# Patient Record
Sex: Female | Born: 1950
Health system: Southern US, Community
[De-identification: ages and names within clinical notes are randomized; demographics above are authoritative.]

## PROBLEM LIST (undated history)

## (undated) DIAGNOSIS — H269 Unspecified cataract: Secondary | ICD-10-CM

## (undated) DIAGNOSIS — Z9889 Other specified postprocedural states: Secondary | ICD-10-CM

## (undated) DIAGNOSIS — J329 Chronic sinusitis, unspecified: Secondary | ICD-10-CM

## (undated) DIAGNOSIS — T4145XA Adverse effect of unspecified anesthetic, initial encounter: Secondary | ICD-10-CM

## (undated) DIAGNOSIS — K2270 Barrett's esophagus without dysplasia: Secondary | ICD-10-CM

## (undated) DIAGNOSIS — K589 Irritable bowel syndrome without diarrhea: Secondary | ICD-10-CM

## (undated) DIAGNOSIS — M1990 Unspecified osteoarthritis, unspecified site: Secondary | ICD-10-CM

## (undated) DIAGNOSIS — K219 Gastro-esophageal reflux disease without esophagitis: Secondary | ICD-10-CM

## (undated) DIAGNOSIS — R112 Nausea with vomiting, unspecified: Secondary | ICD-10-CM

## (undated) DIAGNOSIS — Z8619 Personal history of other infectious and parasitic diseases: Secondary | ICD-10-CM

## (undated) DIAGNOSIS — T7840XA Allergy, unspecified, initial encounter: Secondary | ICD-10-CM

## (undated) DIAGNOSIS — M8580 Other specified disorders of bone density and structure, unspecified site: Secondary | ICD-10-CM

## (undated) DIAGNOSIS — K449 Diaphragmatic hernia without obstruction or gangrene: Secondary | ICD-10-CM

## (undated) DIAGNOSIS — K222 Esophageal obstruction: Secondary | ICD-10-CM

## (undated) DIAGNOSIS — T8859XA Other complications of anesthesia, initial encounter: Secondary | ICD-10-CM

## (undated) HISTORY — DX: Diaphragmatic hernia without obstruction or gangrene: K44.9

## (undated) HISTORY — DX: Irritable bowel syndrome, unspecified: K58.9

## (undated) HISTORY — DX: Barrett's esophagus without dysplasia: K22.70

## (undated) HISTORY — PX: TUBAL LIGATION: SHX77

## (undated) HISTORY — DX: Personal history of other infectious and parasitic diseases: Z86.19

## (undated) HISTORY — DX: Chronic sinusitis, unspecified: J32.9

## (undated) HISTORY — DX: Other specified disorders of bone density and structure, unspecified site: M85.80

## (undated) HISTORY — DX: Gastro-esophageal reflux disease without esophagitis: K21.9

## (undated) HISTORY — DX: Esophageal obstruction: K22.2

## (undated) HISTORY — DX: Allergy, unspecified, initial encounter: T78.40XA

## (undated) HISTORY — DX: Unspecified cataract: H26.9

---

## 1999-04-14 ENCOUNTER — Other Ambulatory Visit: Admission: RE | Admit: 1999-04-14 | Discharge: 1999-04-14 | Payer: Self-pay | Admitting: Family Medicine

## 1999-05-22 ENCOUNTER — Encounter: Payer: Self-pay | Admitting: Gastroenterology

## 1999-06-09 ENCOUNTER — Encounter: Payer: Self-pay | Admitting: Gastroenterology

## 1999-06-16 ENCOUNTER — Encounter: Payer: Self-pay | Admitting: Gastroenterology

## 2000-04-26 ENCOUNTER — Other Ambulatory Visit: Admission: RE | Admit: 2000-04-26 | Discharge: 2000-04-26 | Payer: Self-pay | Admitting: Family Medicine

## 2001-06-01 ENCOUNTER — Other Ambulatory Visit: Admission: RE | Admit: 2001-06-01 | Discharge: 2001-06-01 | Payer: Self-pay | Admitting: Family Medicine

## 2002-10-30 ENCOUNTER — Other Ambulatory Visit: Admission: RE | Admit: 2002-10-30 | Discharge: 2002-10-30 | Payer: Self-pay | Admitting: Family Medicine

## 2003-08-31 DIAGNOSIS — K2270 Barrett's esophagus without dysplasia: Secondary | ICD-10-CM

## 2003-08-31 HISTORY — DX: Barrett's esophagus without dysplasia: K22.70

## 2003-12-17 ENCOUNTER — Ambulatory Visit (HOSPITAL_COMMUNITY): Admission: RE | Admit: 2003-12-17 | Discharge: 2003-12-17 | Payer: Self-pay | Admitting: Gastroenterology

## 2003-12-17 ENCOUNTER — Encounter: Payer: Self-pay | Admitting: Gastroenterology

## 2003-12-17 ENCOUNTER — Encounter (INDEPENDENT_AMBULATORY_CARE_PROVIDER_SITE_OTHER): Payer: Self-pay | Admitting: Specialist

## 2004-01-28 ENCOUNTER — Other Ambulatory Visit: Admission: RE | Admit: 2004-01-28 | Discharge: 2004-01-28 | Payer: Self-pay | Admitting: Family Medicine

## 2004-01-29 ENCOUNTER — Ambulatory Visit (HOSPITAL_COMMUNITY): Admission: RE | Admit: 2004-01-29 | Discharge: 2004-01-29 | Payer: Self-pay | Admitting: Gastroenterology

## 2004-01-29 ENCOUNTER — Encounter: Payer: Self-pay | Admitting: Gastroenterology

## 2004-08-20 ENCOUNTER — Emergency Department: Payer: Self-pay | Admitting: Internal Medicine

## 2004-10-29 ENCOUNTER — Ambulatory Visit: Payer: Self-pay | Admitting: Family Medicine

## 2005-01-05 ENCOUNTER — Ambulatory Visit: Payer: Self-pay | Admitting: Gastroenterology

## 2005-01-28 ENCOUNTER — Other Ambulatory Visit: Admission: RE | Admit: 2005-01-28 | Discharge: 2005-01-28 | Payer: Self-pay | Admitting: Family Medicine

## 2005-01-28 ENCOUNTER — Ambulatory Visit: Payer: Self-pay | Admitting: Family Medicine

## 2005-03-16 LAB — HM DEXA SCAN

## 2005-04-06 ENCOUNTER — Ambulatory Visit: Payer: Self-pay | Admitting: Family Medicine

## 2005-11-16 ENCOUNTER — Ambulatory Visit: Payer: Self-pay | Admitting: Gastroenterology

## 2005-12-07 ENCOUNTER — Ambulatory Visit: Payer: Self-pay | Admitting: Gastroenterology

## 2005-12-21 ENCOUNTER — Ambulatory Visit: Payer: Self-pay | Admitting: Gastroenterology

## 2006-04-07 ENCOUNTER — Ambulatory Visit: Payer: Self-pay | Admitting: Family Medicine

## 2006-11-29 ENCOUNTER — Ambulatory Visit: Payer: Self-pay | Admitting: Gastroenterology

## 2007-01-10 ENCOUNTER — Ambulatory Visit: Payer: Self-pay | Admitting: Gastroenterology

## 2007-04-11 ENCOUNTER — Ambulatory Visit: Payer: Self-pay | Admitting: Family Medicine

## 2008-01-02 ENCOUNTER — Ambulatory Visit: Payer: Self-pay | Admitting: Gastroenterology

## 2008-01-05 ENCOUNTER — Telehealth: Payer: Self-pay | Admitting: Gastroenterology

## 2008-04-23 ENCOUNTER — Ambulatory Visit: Payer: Self-pay | Admitting: Family Medicine

## 2008-08-30 HISTORY — PX: CHOLECYSTECTOMY: SHX55

## 2008-10-21 ENCOUNTER — Telehealth: Payer: Self-pay | Admitting: Gastroenterology

## 2008-11-08 ENCOUNTER — Ambulatory Visit: Payer: Self-pay | Admitting: Gastroenterology

## 2008-11-08 DIAGNOSIS — K2270 Barrett's esophagus without dysplasia: Secondary | ICD-10-CM | POA: Insufficient documentation

## 2008-11-08 DIAGNOSIS — R1011 Right upper quadrant pain: Secondary | ICD-10-CM | POA: Insufficient documentation

## 2008-11-08 DIAGNOSIS — R079 Chest pain, unspecified: Secondary | ICD-10-CM | POA: Insufficient documentation

## 2008-11-08 DIAGNOSIS — K219 Gastro-esophageal reflux disease without esophagitis: Secondary | ICD-10-CM | POA: Insufficient documentation

## 2008-11-11 LAB — CONVERTED CEMR LAB
ALT: 19 units/L (ref 0–35)
Alkaline Phosphatase: 81 units/L (ref 39–117)
Basophils Absolute: 0 10*3/uL (ref 0.0–0.1)
Basophils Relative: 0.7 % (ref 0.0–3.0)
Creatinine, Ser: 0.7 mg/dL (ref 0.4–1.2)
GFR calc non Af Amer: 92 mL/min
HCT: 39.1 % (ref 36.0–46.0)
Hemoglobin: 12.9 g/dL (ref 12.0–15.0)
MCHC: 32.9 g/dL (ref 30.0–36.0)
MCV: 84.3 fL (ref 78.0–100.0)
Monocytes Absolute: 0.4 10*3/uL (ref 0.1–1.0)
Neutro Abs: 4.3 10*3/uL (ref 1.4–7.7)
RDW: 13 % (ref 11.5–14.6)
Sodium: 144 meq/L (ref 135–145)
Total Bilirubin: 0.9 mg/dL (ref 0.3–1.2)
Total Protein: 7.1 g/dL (ref 6.0–8.3)

## 2008-11-15 ENCOUNTER — Ambulatory Visit (HOSPITAL_COMMUNITY): Admission: RE | Admit: 2008-11-15 | Discharge: 2008-11-15 | Payer: Self-pay | Admitting: Gastroenterology

## 2008-11-18 DIAGNOSIS — K8020 Calculus of gallbladder without cholecystitis without obstruction: Secondary | ICD-10-CM | POA: Insufficient documentation

## 2008-11-28 DIAGNOSIS — K222 Esophageal obstruction: Secondary | ICD-10-CM

## 2008-11-28 DIAGNOSIS — K219 Gastro-esophageal reflux disease without esophagitis: Secondary | ICD-10-CM

## 2008-11-28 HISTORY — DX: Gastro-esophageal reflux disease without esophagitis: K21.9

## 2008-11-28 HISTORY — DX: Esophageal obstruction: K22.2

## 2008-12-03 ENCOUNTER — Ambulatory Visit: Payer: Self-pay | Admitting: Gastroenterology

## 2008-12-06 ENCOUNTER — Encounter: Payer: Self-pay | Admitting: Gastroenterology

## 2008-12-06 ENCOUNTER — Ambulatory Visit: Payer: Self-pay | Admitting: Gastroenterology

## 2008-12-09 ENCOUNTER — Encounter: Payer: Self-pay | Admitting: Gastroenterology

## 2008-12-09 ENCOUNTER — Telehealth: Payer: Self-pay | Admitting: Gastroenterology

## 2008-12-10 ENCOUNTER — Encounter: Payer: Self-pay | Admitting: Gastroenterology

## 2008-12-10 ENCOUNTER — Telehealth: Payer: Self-pay | Admitting: Gastroenterology

## 2008-12-11 ENCOUNTER — Ambulatory Visit (HOSPITAL_COMMUNITY): Admission: RE | Admit: 2008-12-11 | Discharge: 2008-12-11 | Payer: Self-pay | Admitting: Surgery

## 2008-12-11 ENCOUNTER — Encounter (INDEPENDENT_AMBULATORY_CARE_PROVIDER_SITE_OTHER): Payer: Self-pay | Admitting: Surgery

## 2009-02-12 ENCOUNTER — Encounter: Payer: Self-pay | Admitting: Gastroenterology

## 2009-04-29 ENCOUNTER — Ambulatory Visit: Payer: Self-pay | Admitting: Family Medicine

## 2009-06-10 ENCOUNTER — Telehealth: Payer: Self-pay | Admitting: Gastroenterology

## 2009-10-15 ENCOUNTER — Encounter (INDEPENDENT_AMBULATORY_CARE_PROVIDER_SITE_OTHER): Payer: Self-pay | Admitting: *Deleted

## 2009-10-20 ENCOUNTER — Encounter: Payer: Self-pay | Admitting: Nurse Practitioner

## 2009-10-24 ENCOUNTER — Telehealth: Payer: Self-pay | Admitting: Gastroenterology

## 2009-10-28 ENCOUNTER — Ambulatory Visit: Payer: Self-pay | Admitting: Internal Medicine

## 2009-10-30 ENCOUNTER — Telehealth: Payer: Self-pay | Admitting: Nurse Practitioner

## 2009-10-31 LAB — CONVERTED CEMR LAB
ALT: 21 units/L (ref 0–35)
Alkaline Phosphatase: 76 units/L (ref 39–117)
Bilirubin, Direct: 0.2 mg/dL (ref 0.0–0.3)
Total Bilirubin: 0.7 mg/dL (ref 0.3–1.2)
Total Protein: 6.4 g/dL (ref 6.0–8.3)

## 2009-11-03 ENCOUNTER — Ambulatory Visit (HOSPITAL_COMMUNITY): Admission: RE | Admit: 2009-11-03 | Discharge: 2009-11-03 | Payer: Self-pay | Admitting: Internal Medicine

## 2009-11-10 ENCOUNTER — Encounter: Payer: Self-pay | Admitting: Cardiology

## 2009-11-10 ENCOUNTER — Ambulatory Visit: Payer: Self-pay | Admitting: Cardiology

## 2009-11-10 ENCOUNTER — Ambulatory Visit: Payer: Self-pay

## 2009-11-10 ENCOUNTER — Ambulatory Visit (HOSPITAL_COMMUNITY): Admission: RE | Admit: 2009-11-10 | Discharge: 2009-11-10 | Payer: Self-pay | Admitting: Family Medicine

## 2009-11-24 ENCOUNTER — Ambulatory Visit: Payer: Self-pay | Admitting: Gastroenterology

## 2009-12-11 ENCOUNTER — Ambulatory Visit: Payer: Self-pay | Admitting: Cardiology

## 2009-12-29 ENCOUNTER — Encounter: Payer: Self-pay | Admitting: Gastroenterology

## 2010-02-24 ENCOUNTER — Telehealth: Payer: Self-pay | Admitting: Gastroenterology

## 2010-02-24 ENCOUNTER — Ambulatory Visit: Payer: Self-pay

## 2010-02-24 ENCOUNTER — Encounter: Payer: Self-pay | Admitting: Cardiovascular Disease

## 2010-05-06 ENCOUNTER — Ambulatory Visit: Payer: Self-pay | Admitting: Family Medicine

## 2010-06-22 ENCOUNTER — Encounter: Payer: Self-pay | Admitting: Gastroenterology

## 2010-09-29 NOTE — Progress Notes (Signed)
Summary: Medication refill  Phone Note From Pharmacy   Caller: [facility]  [phone] Call For: Dr. Russella Dar  Summary of Call: Need refill on her Prevacid Initial call taken by: Karna Christmas,  February 24, 2010 1:33 PM  Follow-up for Phone Call        Rx was sent to pts pharmacy.  Follow-up by: Christie Nottingham CMA Duncan Dull),  February 24, 2010 1:59 PM    Prescriptions: PREVACID SOLUTAB 30 MG  TBDP (LANSOPRAZOLE) take 1 tablet dissolved under the tongue two times a day  #60 x 11   Entered by:   Christie Nottingham CMA (AAMA)   Authorized by:   Meryl Dare MD [facility]   Signed by:   Christie Nottingham CMA (AAMA) on 02/24/2010   Method used:   Electronically to        [facility] [phone]* (retail)       [address] [place], [place]  [address]       Ph: [phone]       Fax: [phone]   RxID:   [id number]

## 2010-09-29 NOTE — Medication Information (Signed)
Summary: Approval/[employer]  Approval/[employer]   Imported By: Lester Healy 12/31/2009 10:45:54  _____________________________________________________________________  External Attachment:    Type:   Image     Comment:   External Document

## 2010-09-29 NOTE — Letter (Signed)
Summary: Office Visit Letter  [facility]  [address] [place], [place] [address]   Phone: [phone]  Fax: [phone]      October 15, 2009 MRN: [id number]   Jenny Mcfarland [address] [place], [place]  [address]   Dear Ms. Maund,   According to our records, it is time for you to schedule a follow-up office visit with [place].   At your convenience, please call [phone] (option #2)to schedule an office visit. If you have any questions, concerns, or feel that this letter is in error, we would appreciate your call.   Sincerely,  Judie Petit T. Russella Dar, M.D.  [facility] [phone]

## 2010-09-29 NOTE — Assessment & Plan Note (Signed)
Summary: np6   Visit Type:  new pt visit Referring Provider:  n/a Primary Provider:  Orson Gear, MD  CC:  pt c/o dull pain between shoulder blades that comes through to the from of her chest she states......denies any cp or sob ore edema.  History of Present Illness: 60 yo with history of severe GERD with Barretts esophagus and history of peptic stricture presents for evaluation of upper back pain after a stress echo.  On 2/20, patient "out of the blue" developed severe RUQ pain that migrated up to her right shoulder blade.  It felt like past symptomatic cholelithiasis pain (but she has had a cholecystectomy).  She then developed a burning sensation between her shoulder blades with resolution of RUQ pain.  The burning sensation between her shoulder blades lasted fairly continuously for 5-6 weeks.  It was not like her prior GERD pain.  It has now completely resolved.  She never had chest pain. The pain was not aggravated by exertion or meals.  She has good exercise tolerance with no exertional dyspnea.  She had a RUQ [place] that was normal.  CXR at her PCP's office was normal.  She saw her gastroenterologist (Dr. Russella Dar) who did not think the symptoms were related to GERD.  She had a stress echo which showed no evidence for ischemia or infarction.  Of note, her mother died with a AAA rupture and she is worried about the possibility of an aneurysm.   ECG: NSR, nonspecific slight ST depression inferolaterally  Current Medications (verified): 1)  Prevacid Solutab 30 Mg  Tbdp (Lansoprazole) .... Take 1 Tablet Dissolved Under The Tongue Two Times A Day 2)  Allegra 180 Mg Tabs (Fexofenadine Hcl) .... Take 1 Tablet By Mouth Once A Day 3)  Ambien 5 Mg Tabs (Zolpidem Tartrate) .... 1/2 Tab At Bedtime For Sleep 4)  Symbicort 160-4.5 Mcg/act Aero (Budesonide-Formoterol Fumarate) .... Two Times A Day  Allergies (verified): 1)  ! Levaquin  Past History:  Past Medical History: 1. GERD with Barretts  esophagus and erosive esophagitis, 11/2008 2. Peptic stricture 11/2008 3. Asthma 4. Allergic rhinitis 5. Irritable Bowel Syndrome 6. Hemorrhoids 7. Hiatal hernia 8. Recurrent sinusitis 9. Stress echo (3/11) EF 65%, no evidence for ischemia or infarction.   Family History: Family History of Heart Disease: Mother CHF but no known CAD No FH of Colon Cancer: Mother died of complications from aortic aneurysm Brother with MI in early 19s  Social History: Married, 2 girls Patient has never smoked.  Alcohol Use - no Illicit Drug Use - no Daily Caffeine Use soft drinks/tea Lives between [place] and [place] Receptionist for a pediatrician  Review of Systems       All systems reviewed and negative except as per HPI.   Vital Signs:  Patient profile:   60 year old female Height:      63 inches Weight:      182 pounds BMI:     32.36 Pulse rate:   69 / minute Pulse rhythm:   irregular BP sitting:   122 / 70  (right arm) Cuff size:   large  Vitals Entered By: Danielle Rankin, CMA (December 11, 2009 3:21 PM)  Physical Exam  General:  Well developed, well nourished, in no acute distress. Head:  normocephalic and atraumatic Nose:  no deformity, discharge, inflammation, or lesions Mouth:  Teeth, gums and palate normal. Oral mucosa normal. Neck:  Neck supple, no JVD. No masses, thyromegaly or abnormal cervical nodes. Lungs:  Clear bilaterally  to auscultation and percussion. Heart:  Non-displaced PMI, chest non-tender; regular rate and rhythm, S1, S2 without murmurs, rubs or gallops. Carotid upstroke normal, no bruit.  Pedals normal pulses. No edema, no varicosities. Abdomen:  Bowel sounds positive; abdomen soft and non-tender without masses, organomegaly, or hernias noted. No hepatosplenomegaly. Msk:  Back normal, normal gait. Muscle strength and tone normal. Extremities:  No clubbing or cyanosis. Neurologic:  Alert and oriented x 3. Skin:  Intact without lesions or rashes. Psych:   Normal affect.   Impression & Recommendations:  Problem # 1:  BACK PAIN Stress echo with no evidence for ischemia or infarction, normal LV systolic function.  Suspect that the pain was noncardiac.  Patient is worried about aortic aneurysm given her family history.  I offered a screening abdominal US to her and I think that she would like to have it done.  In general, given her family history (brother with MI in early 74s),  she will need good control of risk factors (lipids, BP).   Other Orders: Abdominal Aorta Duplex (Abd Aorta Duplex)

## 2010-09-29 NOTE — Assessment & Plan Note (Signed)
Summary: ruq abdominal pain/pain between shoulder blades/sheri   History of Present Illness Visit Type: Follow-up Visit Primary GI MD: Elie Goody MD Primary Provider: Orson Gear, MD Requesting Provider: n/a Chief Complaint: RUQ abd pain that radiates between shoulder blades and diarrhea  History of Present Illness:   Patient followed by  Dr. Russella Dar for history of GERD / Barrett's/ esophageal stricture. On Feb. 20th she developed severe pain behind right  breast with radiation through to right shoulder blade. Pain was burning, achy.  She had associated fever of 100.5. The pain lasted for two hours then spontaneously resolved.  She is on daily PPI. She doesn't recall  history of reflux / heartburn but EGD in 2010 revealed erosive esohagitis as well as esophageal stricture. She has no dysphagia or odynophagia. Up to date on mammograms. No breast changes. No nausea or vomiting. Pain remniscent of  "gallbladder pain" except  patient doesn't recall having such prominent shoulder blade pain.  Saw PCP, labs and CXR "okay". Patient decreased her fat intake and increased Prevacid to twice daily thinking these things would help. Overall feeling better but still having intermittent discomfort between her shoulder blades and now complains of post-prandial fullness.   She has been battling sinus drainage and  a bad cough.     GI Review of Systems      Denies abdominal pain, acid reflux, belching, bloating, chest pain, dysphagia with liquids, dysphagia with solids, heartburn, loss of appetite, nausea, vomiting, vomiting blood, weight loss, and  weight gain.        Denies anal fissure, black tarry stools, change in bowel habit, constipation, diarrhea, diverticulosis, fecal incontinence, heme positive stool, hemorrhoids, irritable bowel syndrome, jaundice, light color stool, liver problems, rectal bleeding, and  rectal pain.    Current Medications (verified): 1)  Prevacid Solutab 30 Mg  Tbdp  (Lansoprazole) .... Take 1 Tablet Dissolved Under The Tongue Two Times A Day 2)  Allegra 180 Mg Tabs (Fexofenadine Hcl) .... Take 1 Tablet By Mouth Once A Day 3)  Ambien 5 Mg Tabs (Zolpidem Tartrate) .... 1/2 Tab At Bedtime For Sleep 4)  Symbicort 160-4.5 Mcg/act Aero (Budesonide-Formoterol Fumarate) .... Two Times A Day 5)  Doxycycline Hyclate 100 Mg Caps (Doxycycline Hyclate) .... One Capsule By Mouth Two Times A Day  Allergies (verified): No Known Drug Allergies  Past History:  Past Medical History: GERD Barretts esophagus, 11/2008 Erosive esophagitis, 11/2008 Peptic stricture 11/2008 Gallstones Asthma Allergic rhinitis Irritable Bowel Syndrome Hemorrhoids Hiatal hernia  Past Surgical History: Bilateral tubal ligation Cholecystectomy  Family History: Family History of Heart Disease: Mother CHF No FH of Colon Cancer:  Social History: Reviewed history from 11/08/2008 and no changes required. Married, 2 girls Patient has never smoked.  Alcohol Use - no Illicit Drug Use - no Daily Caffeine Use soft drinks/tea  Review of Systems       The patient complains of back pain and fever.  The patient denies allergy/sinus, anemia, anxiety-new, arthritis/joint pain, blood in urine, breast changes/lumps, change in vision, confusion, cough, coughing up blood, depression-new, fainting, fatigue, headaches-new, hearing problems, heart murmur, heart rhythm changes, itching, menstrual pain, muscle pains/cramps, night sweats, nosebleeds, pregnancy symptoms, shortness of breath, skin rash, sleeping problems, sore throat, swelling of feet/legs, swollen lymph glands, thirst - excessive , urination - excessive , urination changes/pain, urine leakage, vision changes, and voice change.    Vital Signs:  Patient profile:   60 year old female Height:      66 inches Weight:  181 pounds BMI:     29.32 BSA:     1.92 Temp:     97.9 degrees F oral Pulse rate:   92 / minute Pulse rhythm:    regular BP sitting:   98 / 64  (left arm) Cuff size:   regular  Vitals Entered By: Ok Anis CMA (October 28, 2009 2:05 PM)  Physical Exam  General:  Well developed, well nourished, no acute distress. Head:  Normocephalic and atraumatic. Mouth:  No oral lesions. Tongue moist.  Neck:  no obvious masses  Lungs:  Clear to auscultation throughout. Heart:  Regular rate and rhythm; no murmurs, rubs,  or bruits. Abdomen:  Abdomen soft, nontender, nondistended. No obvious masses or hepatomegaly.Normal bowel sounds.  Msk:  Symmetrical with no gross deformities. Normal posture. No chest wall tenderness. Extremities:  No palmar erythema, no edema.  Neurologic:  Alert and  oriented x4;  grossly normal neurologically. Skin:  Intact without significant lesions or rashes. Cervical Nodes:  No significant cervical adenopathy. Psych:  Alert and cooperative. Normal mood and affect.   Impression & Recommendations:  Problem # 1:  CHEST PAIN (ICD-786.50) Assessment Deteriorated Recent episode of severe pain behind right breast radiating to right shoulder blade. No SOB. Same pain as with gallbladder attacks. No SOB.  Evaluated by PCP. Patient tells me CXR was normal. I recieved lab results revealing normal CBC, D-Dimer, CPK and Troponin. LFTs were not drawn. She has some residual discomfort between shoulder blades. Patient is scheduled for stress test 11/10/09. Will obtain LFTs, U/S abdomen to evaluate for biliary disease.. Continue PPI twice daily for now.  Follow up appt. will be made with Dr. Russella Dar.  Patient will be called with test results and any further recommendations based on those results. If her stress test is abnormal, patient will let us know so that GI workup will be postponed.   Orders: Ultrasound Abdomen (UAS) TLB-Hepatic/Liver Function Pnl (80076-HEPATIC)  Problem # 2:  BARRETT'S ESOPHAGUS (ICD-530.85) Assessment: Comment Only Continue PPI  Patient Instructions: 1)  Your physician  has requested that you have the following labwork done today: Please go to basement level lab. 2)  We scheduled the Ultrasound at [facility] 1st floor for [facility] 11-03-09.  Please arrive at 8:45Am. Have nothing to eat or drink after midnight. 3)  Continue the Prevacid Solutab twice daily. 4)  Copy sent to : Orson Gear, MD 5)  The medication list was reviewed and reconciled.  All changed / newly prescribed medications were explained.  A complete medication list was provided to the patient / caregiver.

## 2010-09-29 NOTE — Assessment & Plan Note (Signed)
Summary: yearly check up..em   History of Present Illness Visit Type: Follow-up Visit Primary GI MD: Elie Goody MD Primary Provider: Orson Gear, MD Requesting Provider: n/a Chief Complaint: F/u from office visit with Jenny Mcfarland for R chest pain that radiates between her shoulder blades. Pt states that the chest pain is gone but still having pain in between her shoulder blades History of Present Illness:   [name] has resolution of her right sided chest pain and now has intermittent pains going across her upper back. No clear relationship to meals or any digestive function. She has no abdominal pain or chest pain. Her abd U/S and LFTs were normal. Her GERD symptoms are well controlled.   GI Review of Systems      Denies abdominal pain, acid reflux, belching, bloating, chest pain, dysphagia with liquids, dysphagia with solids, heartburn, loss of appetite, nausea, vomiting, vomiting blood, weight loss, and  weight gain.        Denies anal fissure, black tarry stools, change in bowel habit, constipation, diarrhea, diverticulosis, fecal incontinence, heme positive stool, hemorrhoids, irritable bowel syndrome, jaundice, light color stool, liver problems, rectal bleeding, and  rectal pain.   Current Medications (verified): 1)  Prevacid Solutab 30 Mg  Tbdp (Lansoprazole) .... Take 1 Tablet Dissolved Under The Tongue Two Times A Day 2)  Allegra 180 Mg Tabs (Fexofenadine Hcl) .... Take 1 Tablet By Mouth Once A Day 3)  Ambien 5 Mg Tabs (Zolpidem Tartrate) .... 1/2 Tab At Bedtime For Sleep 4)  Symbicort 160-4.5 Mcg/act Aero (Budesonide-Formoterol Fumarate) .... Two Times A Day  Allergies (verified): No Known Drug Allergies  Past History:  Past Medical History: GERD Barretts esophagus, 11/2008 Erosive esophagitis, 11/2008 Peptic stricture 11/2008 Asthma Allergic rhinitis Irritable Bowel Syndrome Hemorrhoids Hiatal hernia  Past Surgical History: Bilateral tubal  ligation Cholecystectomy, 2010  Family History: Reviewed history from 10/28/2009 and no changes required. Family History of Heart Disease: Mother CHF No FH of Colon Cancer:  Social History: Reviewed history from 11/08/2008 and no changes required. Married, 2 girls Patient has never smoked.  Alcohol Use - no Illicit Drug Use - no Daily Caffeine Use soft drinks/tea  Review of Systems       The patient complains of allergy/sinus.         The pertinent positives and negatives are noted as above and in the HPI. All other ROS were reviewed and were negative.   Vital Signs:  Patient profile:   60 year old female Height:      63 inches Weight:      181 pounds BMI:     32.18 BSA:     1.86 Pulse rate:   88 / minute Pulse rhythm:   regular BP sitting:   100 / 60  (left arm) Cuff size:   regular  Vitals Entered By: Ok Anis CMA (November 24, 2009 3:08 PM)  Physical Exam  General:  Well developed, well nourished, no acute distress. Head:  Normocephalic and atraumatic. Eyes:  PERRLA, no icterus. Mouth:  No deformity or lesions, dentition normal. Chest Wall:  Symmetrical;  no deformities or tenderness. Lungs:  Clear throughout to auscultation. Heart:  Regular rate and rhythm; no murmurs, rubs,  or bruits. Abdomen:  Soft, nontender and nondistended. No masses, hepatosplenomegaly or hernias noted. Normal bowel sounds. Psych:  Alert and cooperative.anxious.    Impression & Recommendations:  Problem # 1:  BARRETT'S ESOPHAGUS (ICD-530.85) Continue Prevacid 30 mg by mouth two times a day and antireflux measures.  Surveillance EGD in 2 years.  Problem # 2:  CHEST PAIN (ICD-786.50) Resolved. Not likely GI related. No GI cause for intermittent upper back pain. Follow up with her PCP.  Problem # 3:  SCREENING COLORECTAL-CANCER (ICD-V76.51) Screening colonoscopy 11/2015.  Patient Instructions: 1)  Please continue current medications.  2)  Please schedule a follow-up appointment in 1  year. 3)  Copy sent to : Orson Gear, MD 4)  The medication list was reviewed and reconciled.  All changed / newly prescribed medications were explained.  A complete medication list was provided to the patient / caregiver.

## 2010-09-29 NOTE — Progress Notes (Signed)
Summary: Talk to nurse about some problems  Phone Note Call from Patient Call back at Home Phone [phone]   Call For: Dr Russella Dar Reason for Call: Talk to Nurse Summary of Call: Wants to speak to nurse about some problems she has been having.  Initial call taken by: Leanor Kail [facility],  October 24, 2009 9:21 AM  Follow-up for Phone Call        Patient  having pain in the right upper quad and between her shoulder blades, last Sunday after an attack of pain she had a t-max 100.  Pain is intermittent and after meals.  Patient  had a cholecystectomy for gallstones last year and says it is the same pain.  Slight pain today but no fever. She increased her prevacid to BID and has a slight improvement.  Patient  is offered an appointment with Dr Russella Dar for this afternoon, but she wants to try and modify her diet and wait to see what happens over the weekend.  She will call me back if she wants to be seen by an extender next week. Pt instructed to maintain anti-reflux diet. Advised to avoid caffeine, mint, citrus foods/juices, tomatoes, chocolate, fatty or greasy foods. Instructed not to eat within 2 hours of exercise or bed, small meals are better than 3 large. Need to take PPI 30 minutes prior to 1st meal of the day. Follow-up by: Darcey Nora RN, CGRN,  October 24, 2009 10:16 AM  Additional Follow-up for Phone Call Additional follow up Details #1::        Would offer LFTs, lipase, BMET, CBC or Monday and REV if symptoms persist or labs abnl. Additional Follow-up by: Meryl Dare MD Clementeen Graham,  October 24, 2009 10:58 AM    Additional Follow-up for Phone Call Additional follow up Details #2::    I spoke with the patient again, she is unable to come today for labs.  She has requested an appointment for next week.  patient scheduled to see Willette Cluster RNP 10-28-09 2:00.  She wants to try and modify her diet over the weekend Follow-up by: Darcey Nora RN, CGRN,  October 24, 2009 11:15 AM

## 2010-09-29 NOTE — Miscellaneous (Signed)
Summary: Lansoprazole refill  Clinical Lists Changes  Medications: Rx of PREVACID SOLUTAB 30 MG  TBDP (LANSOPRAZOLE) take 1 tablet dissolved under the tongue two times a day;  #60 x 5;  Signed;  Entered by: Christie Nottingham CMA (AAMA);  Authorized by: Meryl Dare MD Clementeen Graham;  Method used: Electronically to [facility] [phone]*, [address] [place], [place]  [address], Ph: [phone], Fax: [phone]    Prescriptions: PREVACID SOLUTAB 30 MG  TBDP (LANSOPRAZOLE) take 1 tablet dissolved under the tongue two times a day  #60 x 5   Entered by:   Christie Nottingham CMA (AAMA)   Authorized by:   Meryl Dare MD [facility]   Signed by:   Christie Nottingham CMA (AAMA) on 06/22/2010   Method used:   Electronically to        [facility] [phone]* (retail)       [address] [place], [place]  [address]       Ph: [phone]       Fax: [phone]   RxID:   [id number]

## 2010-09-29 NOTE — Progress Notes (Signed)
Summary: PHI  PHI   Imported By: Harlon Flor 12/12/2009 12:20:04  _____________________________________________________________________  External Attachment:    Type:   Image     Comment:   External Document

## 2010-09-29 NOTE — Progress Notes (Signed)
Summary: results  Phone Note Call from Patient Call back at Work Phone [phone]   Caller: Patient Call For: [name] Reason for Call: Talk to Nurse, Lab or Test Results Summary of Call: Patient wants lab results  Initial call taken by: Tawni Levy,  October 30, 2009 8:43 AM  Follow-up for Phone Call        Pt advised his LFT's are normal and we will wait to see what the US shows which is scheduled for 11-03-09.

## 2010-10-21 ENCOUNTER — Encounter: Payer: Self-pay | Admitting: Family Medicine

## 2010-11-26 ENCOUNTER — Encounter: Payer: Self-pay | Admitting: Gastroenterology

## 2010-11-26 ENCOUNTER — Ambulatory Visit (INDEPENDENT_AMBULATORY_CARE_PROVIDER_SITE_OTHER): Payer: 59 | Admitting: Gastroenterology

## 2010-11-26 DIAGNOSIS — R197 Diarrhea, unspecified: Secondary | ICD-10-CM

## 2010-11-26 DIAGNOSIS — K2270 Barrett's esophagus without dysplasia: Secondary | ICD-10-CM

## 2010-11-26 MED ORDER — CHOLESTYRAMINE 4 G PO PACK: 1.0000 | PACK | Freq: Every day | ORAL | Status: DC

## 2010-11-26 MED ORDER — LANSOPRAZOLE 30 MG PO TBDP: 30.0000 mg | ORAL_TABLET | Freq: Two times a day (BID) | ORAL | Status: DC

## 2010-11-26 NOTE — Assessment & Plan Note (Signed)
Short segment of Barrett's esophagus with a history of a peptic stricture. Reflux symptoms well controlled on pantoprazole. Continue pantoprazole 30 mg daily along with standard antireflux measures. Surveillance endoscopy to April 2013.

## 2010-11-26 NOTE — Assessment & Plan Note (Addendum)
Urgent postprandial diarrhea which has worsened since her cholecystectomy in 2010. She likely has underlying irritable bowel syndrome but may have a component of bile salt diarrhea. Trial of Questran daily. May adjust dose to twice a day or every other day titrated for adequate symptom control.

## 2010-11-26 NOTE — Progress Notes (Signed)
History of Present Illness: This is a 60 year old female who returns for followup of GERD, Barrett's esophagus and diarrhea. Her reflux symptoms are under very good control. She has no dysphasia vomiting or abdominal pain. She has a history of irritable bowel syndrome with intermittent urgent, loose stools however since her cholecystectomy in 2010 she has had urgent watery stools on a regular basis. Colonoscopy performed in April 2007 was normal except for small hemorrhoids. She denies melena. hematochezia, weight loss.  Current Medications, Allergies, Past Medical History, Past Surgical History, Family History and Social History were reviewed in [employer] record.  Physical Exam: General: Well developed , well nourished, no acute distress Head: Normocephalic and atraumatic Eyes:  sclerae anicteric, EOMI Ears: Normal auditory acuity Mouth: No deformity or lesions Lungs: Clear throughout to auscultation Heart: Regular rate and rhythm; no murmurs, rubs or bruits Abdomen: Soft, non tender and non distended. No masses, hepatosplenomegaly or hernias noted. Normal Bowel sounds Musculoskeletal: Symmetrical with no gross deformities  Extremities: No clubbing, cyanosis, edema or deformities noted Neurological: Alert oriented x 4, grossly nonfocal Psychological:  Alert and cooperative. Normal mood and affect  Assessment and Recommendations:

## 2010-11-26 NOTE — Patient Instructions (Signed)
Your prescriptions have been sent to your pharmacy 

## 2010-12-08 ENCOUNTER — Ambulatory Visit: Payer: Self-pay | Admitting: Family Medicine

## 2010-12-09 LAB — DIFFERENTIAL
Basophils Absolute: 0 10*3/uL (ref 0.0–0.1)
Eosinophils Relative: 14 % — ABNORMAL HIGH (ref 0–5)
Lymphocytes Relative: 25 % (ref 12–46)
Lymphs Abs: 1.4 10*3/uL (ref 0.7–4.0)
Neutro Abs: 3.1 10*3/uL (ref 1.7–7.7)
Neutrophils Relative %: 54 % (ref 43–77)

## 2010-12-09 LAB — CBC
HCT: 35.8 % — ABNORMAL LOW (ref 36.0–46.0)
MCHC: 34 g/dL (ref 30.0–36.0)
MCV: 83 fL (ref 78.0–100.0)
RBC: 4.32 MIL/uL (ref 3.87–5.11)
WBC: 5.8 10*3/uL (ref 4.0–10.5)

## 2010-12-09 LAB — COMPREHENSIVE METABOLIC PANEL
AST: 17 U/L (ref 0–37)
BUN: 6 mg/dL (ref 6–23)
CO2: 30 mEq/L (ref 19–32)
Calcium: 9.3 mg/dL (ref 8.4–10.5)
Chloride: 107 mEq/L (ref 96–112)
Creatinine, Ser: 0.7 mg/dL (ref 0.4–1.2)
GFR calc Af Amer: >60 mL/min (ref >60)
GFR calc non Af Amer: >60 mL/min (ref >60)
Glucose, Bld: 108 mg/dL — ABNORMAL HIGH (ref 70–99)
Total Bilirubin: 0.7 mg/dL (ref 0.3–1.2)

## 2010-12-25 ENCOUNTER — Ambulatory Visit (INDEPENDENT_AMBULATORY_CARE_PROVIDER_SITE_OTHER): Payer: 59 | Admitting: Family Medicine

## 2010-12-25 ENCOUNTER — Encounter: Payer: Self-pay | Admitting: Family Medicine

## 2010-12-25 DIAGNOSIS — M899 Disorder of bone, unspecified: Secondary | ICD-10-CM

## 2010-12-25 DIAGNOSIS — K589 Irritable bowel syndrome without diarrhea: Secondary | ICD-10-CM

## 2010-12-25 DIAGNOSIS — Z8249 Family history of ischemic heart disease and other diseases of the circulatory system: Secondary | ICD-10-CM

## 2010-12-25 DIAGNOSIS — J45909 Unspecified asthma, uncomplicated: Secondary | ICD-10-CM | POA: Insufficient documentation

## 2010-12-25 DIAGNOSIS — K2270 Barrett's esophagus without dysplasia: Secondary | ICD-10-CM

## 2010-12-25 DIAGNOSIS — M8580 Other specified disorders of bone density and structure, unspecified site: Secondary | ICD-10-CM

## 2010-12-25 DIAGNOSIS — J309 Allergic rhinitis, unspecified: Secondary | ICD-10-CM

## 2010-12-25 DIAGNOSIS — E785 Hyperlipidemia, unspecified: Secondary | ICD-10-CM

## 2010-12-25 DIAGNOSIS — Z803 Family history of malignant neoplasm of breast: Secondary | ICD-10-CM

## 2010-12-25 NOTE — Progress Notes (Signed)
Subjective:    Patient ID: Jenny Mcfarland, female    DOB: Jan 28, 1951, 60 y.o.   MRN: [id number]  HPI Here to get est for primary care Used to see Dr Benedetto Goad  Had to transfer due to insurance   Lives between here and [place]    GI- sees Dr Russella Dar  Has IBS and GERD with Barretts Has regular endocsopies for Barretts- last 5-6 years Due in 2013 for colonoscopy- gets that every 10 years Is well controlled  IBS-- diarrhea predominant - recently tried [place] - gives her bad reflux     Allergies - Dr [place] Callas -- pretty well controlled on current meds  Also asthma --is doing well with that  Flares primarily with illness  Does not get allergy shots   Does get a flu shot every year -- got one last fall  Never had pneumovax  Is interested in talking to Dr [place] Callas about that   Sleep disorder - on lunesta -- bad dreams (ambien worked better )  Still has hot flashes - wakes up every hour   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 GERD (gastroesophageal reflux disease) 11/2008  . Peptic stricture of esophagus 11-2008  . Asthma   . Allergic rhinitis   . IBS (irritable bowel syndrome)   . Hemorrhoids   . Hiatal hernia   . Recurrent sinusitis   . Barrett's esophagus 2005  . History of chicken pox    Past Surgical History  Procedure Date  . Tubal ligation   . Cholecystectomy 2010    reports that she has never smoked. She has never used smokeless tobacco. She reports that she does not drink alcohol or use illicit  drugs. family history includes Aortic aneurysm in her mother; Arthritis in her brother; Breast cancer in her sister; Cancer in her sister; Heart attack in her brother; and Heart disease in her brother and mother.  There is no history of Colon cancer. Allergies  Allergen Reactions  . Levofloxacin        Review of Systems Review of Systems  Constitutional: Negative for fever, appetite change, fatigue and unexpected weight change.  Eyes: Negative for pain and visual disturbance.  Respiratory: Negative for cough and shortness of breath.   Cardiovascular: Negative.   Gastrointestinal: Negative for nausea, diarrhea and constipation.  Genitourinary: Negative for urgency and frequency.  Skin: Negative for pallor.  Neurological: Negative for weakness, light-headedness, numbness and headaches.  Hematological: Negative for adenopathy. Does not bruise/bleed easily.  Psychiatric/Behavioral: Negative for dysphoric mood. The patient is not nervous/anxious.          Objective:   Physical Exam  Constitutional: She appears well-developed and well-nourished.       overwt and well appearing   HENT:  Head: Normocephalic and atraumatic.  Right Ear: External ear normal.  Left Ear: External ear normal.  Nose: Nose normal.  Mouth/Throat: Oropharynx is clear and moist.       Nares are boggy but clear   Eyes: Conjunctivae and EOM are normal. Pupils are equal, round, and reactive to light.  Neck: Normal range of motion. Neck supple. No JVD present. Carotid bruit is not present. No thyromegaly present.  Cardiovascular: Normal rate, regular rhythm and normal heart sounds.   Pulmonary/Chest: Effort normal and breath sounds normal.  Abdominal: She exhibits no distension, no abdominal bruit, no pulsatile midline mass and no mass. There is no guarding.  Musculoskeletal: Normal range of motion. She exhibits no edema and no tenderness.  Lymphadenopathy:    She has no cervical adenopathy.  Neurological: She  is alert. She has normal reflexes.  Skin: Skin is warm and dry. No rash noted. No erythema. No pallor.  Psychiatric: She has a normal mood and affect.          Assessment & Plan:

## 2010-12-25 NOTE — Patient Instructions (Signed)
Schedule PE in late sept please --- am appt fasting for labs that day as well  Talk to Dr [place] Callas about pneumonia vaccine  Work on [employer] and exercise

## 2010-12-27 ENCOUNTER — Encounter: Payer: Self-pay | Admitting: Family Medicine

## 2010-12-27 DIAGNOSIS — M8580 Other specified disorders of bone density and structure, unspecified site: Secondary | ICD-10-CM | POA: Insufficient documentation

## 2010-12-27 NOTE — Assessment & Plan Note (Addendum)
Diarrhea predominant - tx by GI  Unfortunately- not helped by [place] so far Pt will disc with GI

## 2010-12-27 NOTE — Assessment & Plan Note (Signed)
Urged to continue regular breast exams and self exams

## 2010-12-27 NOTE — Assessment & Plan Note (Signed)
With GERD- asymptomatic with treatment mt by GI [place]

## 2010-12-27 NOTE — Assessment & Plan Note (Signed)
Disc low sat fat diet in detail  Will review old records Plan to check before health mt exam in fall and disc then

## 2010-12-27 NOTE — Assessment & Plan Note (Signed)
Pt has been screened multiple times by [place] and claims all is normal  Will be on the look out for any symptoms

## 2011-01-01 ENCOUNTER — Encounter: Payer: Self-pay | Admitting: Family Medicine

## 2011-01-08 ENCOUNTER — Telehealth: Payer: Self-pay | Admitting: Gastroenterology

## 2011-01-08 NOTE — Telephone Encounter (Signed)
Patient has tried [place] that was prescribed for her in March.  This gives her bloating and reflux.  Is there an alternative to try for her diarrhea.

## 2011-01-10 NOTE — Telephone Encounter (Signed)
She can try Questran 1/2 dose every day or 1/2 to 1 dose every other day. If that is not workable she can use Imodium AD bid prn.

## 2011-01-11 NOTE — Telephone Encounter (Signed)
Patient advised of Dr. Stark's recommendations. 

## 2011-01-12 NOTE — Assessment & Plan Note (Signed)
[facility]                         GASTROENTEROLOGY OFFICE NOTE   XIAO, GRAUL                      MRN:          [id number]  DATE:01/10/2007                            DOB:          1951-08-18    Ms. Thune relates complete resolution of her intermittent chest pain  and back pain.  She feels that a component of her symptoms may have been  related to stress and a component related to GERD.  She has no ongoing  gastrointestinal or chest complaints.   CURRENT MEDICATIONS:  Listed on the chart, updated and reviewed.   MEDICATION ALLERGIES:  NONE KNOWN.   EXAMINATION:  No acute distress.  Weight 188 pounds, blood pressure is  132/80, pulse 68 and regular.  She is not re-examined.   ASSESSMENT/PLAN:  Gastroesophageal reflux disease and chest pain.  Continue Prevacid 30 mg p.o. b.i.d. along with standard anti-reflux  measures.  Further followup with Dr. Benedetto Goad if her chest pain recurs.  Return office visit with me in 12 months.     Venita Lick. Russella Dar, MD, [facility]  Electronically Signed    MTS/MedQ  DD: 01/10/2007  DT: 01/10/2007  Job #: [id number]   cc:   Elease Hashimoto A. Benedetto Goad, M.D.

## 2011-01-12 NOTE — Assessment & Plan Note (Signed)
[facility]                         GASTROENTEROLOGY OFFICE NOTE   EMMERSYN, KRATZKE                      MRN:          [id number]  DATE:01/02/2008                            DOB:          1951-06-18    This is a return office visit for GERD with a history of a peptic  stricture.  She has no dysphagia or odynophagia.  Her reflux symptoms  are under very good control on Prevacid 30 mg p.o. every morning.  She  decreased from b.i.d. therapy about six weeks ago.  She has had rare  episodes of breakthrough acid reflux.   CURRENT MEDICATIONS:  Listed on the chart, updated and reviewed.   MEDICATION ALLERGIES:  None known.   PHYSICAL EXAMINATION:  No acute distress.  Weight 185 pounds.  Blood  pressure is 108/70, pulse 68, regular.  CHEST:  Clear to auscultation bilaterally.  CARDIAC:  Regular rate and rhythm without murmurs appreciated.  ABDOMEN:  Soft and nontender with normoactive bowel sounds.   ASSESSMENT/PLAN:  Gastroesophageal reflux disease with a history of a  peptic stricture.  She has rare episodes of breakthrough.  She may use  Tums or Maalox for breakthrough symptoms.  Renew Prevacid at 30 mg p.o.  every morning for one year.  If this does not adequately control her  symptoms, we can return to b.i.d. therapy.  Return office visit one  year.     Venita Lick. Russella Dar, MD, [facility]  Electronically Signed    MTS/MedQ  DD: 01/02/2008  DT: 01/02/2008  Job #: [id number]   cc:   Elease Hashimoto A. Benedetto Goad, M.D.

## 2011-01-12 NOTE — Op Note (Signed)
NAMEJOURY, ALLCORN               ACCOUNT NO.:  [id number]   MEDICAL RECORD NO.:  [id number]          PATIENT TYPE:  AMB   LOCATION:  DAY                          FACILITY:  [facility]   PHYSICIAN:  Wilmon Arms. Corliss Skains, M.D. DATE OF BIRTH:  Sep 08, 1950   DATE OF PROCEDURE:  12/11/2008  DATE OF DISCHARGE:                               OPERATIVE REPORT   PREOPERATIVE DIAGNOSES:  Chronic calculus cholecystitis.   POSTOPERATIVE DIAGNOSES:  Chronic calculus cholecystitis.   PROCEDURE PERFORMED:  Laparoscopic cholecystectomy with intraoperative  cholangiogram.   SURGEON:  Wilmon Arms. Corliss Skains, M.D.   ASSISTANT:  Almond Lint, M.D.   ANESTHESIA:  General endotracheal.   INDICATIONS:  The patient is a 60 year old female who presents with  several-year history of intermittent epigastric right upper quadrant  pain, associated with nausea and bloating.  An EGD shows a small hiatal  hernia and some minimal Barrett's esophagus.  An ultrasound showed  multiple gallstones but no sign of wall-thickening.  Liver function  tests were normal.  She presents now for elective cholecystectomy.   DESCRIPTION OF PROCEDURE:  The patient was brought to the operating  room, placed in supine position on the operating room table.  After an  adequate level of general anesthesia was obtained, the patient's abdomen  was prepped with Betadine and draped in sterile fashion.  Time-out was  taken to assure the proper patient, proper procedure.  We infiltrated  the area below her umbilicus with 0.25% Marcaine with epinephrine.  We  made a transverse incision and dissected down to the fascia.  The fascia  was grasped with Kocher clamps and opened vertically.  The peritoneal  cavity was bluntly entered.  A stay sutures of 0 Vicryl was placed  around the fascial opening.  The Hasson cannula was inserted, secured  with a stay suture.  Pneumoperitoneum was obtained by insufflating CO2,  maintaining a maximal pressure of 15 mmHg.   The laparoscope was inserted  and the patient was positioned in reverse Trendelenburg, tilted to her  left.  An 11-mm port was placed in the subxiphoid position.  Two 5-mm  ports were placed in the right upper quadrant.   We elevated the liver and visualized the gallbladder.  There were  minimal adhesions to the surface of the gallbladder.  The gallbladder  was grasped with a clamp and elevated over the edge of the liver.  We  began by bluntly dissecting around the hilum of the gallbladder.  The  cystic duct was identified and was circumferentially dissected.  A small  opening was created on the cystic duct.  A Cook cholangiogram catheter  was then inserted through a stab incision, threaded into the cystic  duct.  The catheter was threaded into the cystic duct and cholangiogram  showed a diminutive common bile duct with good flow proximally and  distally, biliary tree.  Contrast flowed easily into the duodenum.   The cystic duct was ligated clips and divided.  We then visualized a  very a fairly large structure, immediately adjacent to the cystic duct.  I felt that this was too large  to be the cystic artery.  I then called  one of my partners, Dr. Donell Beers, for some assistance.  We bluntly  dissected around the base of the gallbladder and opened up the  peritoneum along the sides of the gallbladder.  With continued careful  blunt dissection, we were finally able to visualize the structures  clearly.  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  We were able to isolate the small branch coming off the cystic  duct.  We ligated the cystic artery with clips.  We were able to  preserve the entire right hepatic artery.  Cautery was then used to  remove the gallbladder from the liver bed.  A little bit of bile was  spilled but no stones were noted.  The gallbladder was placed in an  EndoCatch sack.  We reinspected the gallbladder fossa for hemostasis.  We removed the  gallbladder in the EndoCatch sack through the umbilical  port site.   We again inspected for hemostasis.  We suctioned out as much irrigation  as possible.  An entire liter of saline was used.  Pneumoperitoneum was  then released as the trocars were removed.  The pursestring suture was  used to close down the umbilical fascia and 4-0 Monocryl was used to  close the skin incisions.  Steri-Strips and clean dressings were  applied.  The patient was then extubated and brought to the recovery  room in stable condition.  All sponge, instrument, and needle counts  were correct.      Wilmon Arms. Tsuei, M.D.  Electronically Signed     MKT/MEDQ  D:  12/11/2008  T:  12/11/2008  Job:  [id number]

## 2011-01-15 NOTE — Assessment & Plan Note (Signed)
[facility]                         GASTROENTEROLOGY OFFICE NOTE   [name], [name]                      MRN:          [id number]  DATE:11/29/2006                            DOB:          01/24/1951    Jenny Mcfarland returns on referral from Dr. Benedetto Goad with complaints of  intermittent chest pain and back pain.  She relates several episodes of  heavy mid chest pain and episodes of throat spasms.  She also has  had epigastric pain and right-sided chest pain.  Her symptoms have  generally not been associated with meals or following meals.  She has  been treated for a persistent bronchitis with several courses of  antibiotics, prednisone, and Advair.  These symptoms are improving but  have been a persistent problem for about the past two to three months.  In addition, her husband was diagnosed with rectal cancer, and surgery  is scheduled for next week.  She states she has been under a tremendous  amount of stress with her husband's illness.  She also had shingles  develop on her right chest which have now resolved.  She has been seen  by Dr. Benedetto Goad and Dr. [place] Callas both on several occasions over the past  few months.  She states she underwent a nuclear stress test on March 6th  in [place], which was negative.  She has known cholelithiasis from an  ultrasound in April of 2007, but at that time she did not have any clear  biliary symptoms.  She has been treated with Prevacid for many years for  GERD complicated by a peptic stricture.  Her last endoscopy was in April  of 2005, and she underwent stricture dilation at that time.  Her chest  pain symptoms are generally very severe and brief in duration.  She has  no dysphagia, odynophagia, change in bowel habits, weight loss, melena,  or hematochezia.   MEDICATIONS:  Listed on the chart updated and reviewed.   MEDICATION ALLERGIES:  None known.   PHYSICAL EXAMINATION:  GENERAL:  In no acute distress.  VITAL SIGNS:  Weight 188 pounds, blood pressure is 122/82, pulse 64 and  regular.  HEENT:  Anicteric sclerae, oropharynx clear.  CHEST:  Clear to auscultation bilaterally.  No chest wall tenderness  appreciated.  CARDIAC:  Regular rate and rhythm without murmurs.  ABDOMEN:  Soft, nontender, nondistended, normoactive bowel sounds, no  palpable organomegaly, masses, or hernias.   ASSESSMENT AND PLAN:  Episodic chest pain.  Etiology not clear.  Unlikely to be related to cholelithiasis given that it has not been  related to meals.  Rule out musculoskeletal symptoms, stress, panic  attacks, and gastroesophageal reflux disease.  Will increase Prevacid to  30 mg p.o. b.i.d.  Plan for return office visit in four weeks.    Venita Lick. Russella Dar, MD, [facility]  Electronically Signed   MTS/MedQ  DD: 11/29/2006  DT: 11/29/2006  Job #: [id number]   cc:   Elease Hashimoto A. Benedetto Goad, M.D.

## 2011-03-02 ENCOUNTER — Ambulatory Visit: Payer: Self-pay | Admitting: Family Medicine

## 2011-04-08 ENCOUNTER — Other Ambulatory Visit: Payer: Self-pay | Admitting: *Deleted

## 2011-04-08 MED ORDER — ZOLPIDEM TARTRATE 5 MG PO TABS: 5.0000 mg | ORAL_TABLET | Freq: Every evening | ORAL | Status: DC | PRN

## 2011-04-08 NOTE — Telephone Encounter (Signed)
That is ok  Px written for call in

## 2011-04-08 NOTE — Telephone Encounter (Signed)
Sorry- when there is a sig there sometimes it won't let me change it  Is for 1 tab qhs prn

## 2011-04-08 NOTE — Telephone Encounter (Signed)
Patient has been taking lunesta, but wants to go back to the [place] because she is not pleased with the lunesta. Patient has a cpx scheduled for 05-25-11 and is asking if she can get it filled until then.

## 2011-04-08 NOTE — Telephone Encounter (Signed)
Medication phoned to [facility] pharmacy as instructed.

## 2011-04-08 NOTE — Telephone Encounter (Signed)
I'm sorry to bother you but could I ask for clarification of instructions for Ambien. My screen reads 1 tab at bedtime as needed for sleep but then it is followed by 1/2 tab at bedtime for sleep. Please advise.

## 2011-05-25 ENCOUNTER — Ambulatory Visit (INDEPENDENT_AMBULATORY_CARE_PROVIDER_SITE_OTHER): Payer: 59 | Admitting: Family Medicine

## 2011-05-25 ENCOUNTER — Encounter: Payer: Self-pay | Admitting: Family Medicine

## 2011-05-25 ENCOUNTER — Other Ambulatory Visit (HOSPITAL_COMMUNITY)
Admission: RE | Admit: 2011-05-25 | Discharge: 2011-05-25 | Disposition: A | Discharge status: Home / Self Care | Payer: 59 | Source: Ambulatory Visit | Attending: Family Medicine | Admitting: Family Medicine

## 2011-05-25 VITALS — BP 136/78 | HR 80 | Temp 98.0°F | Ht 65.0 in | Wt 185.2 lb

## 2011-05-25 DIAGNOSIS — H01139 Eczematous dermatitis of unspecified eye, unspecified eyelid: Secondary | ICD-10-CM

## 2011-05-25 DIAGNOSIS — Z01419 Encounter for gynecological examination (general) (routine) without abnormal findings: Secondary | ICD-10-CM

## 2011-05-25 DIAGNOSIS — Z23 Encounter for immunization: Secondary | ICD-10-CM

## 2011-05-25 DIAGNOSIS — Z1159 Encounter for screening for other viral diseases: Secondary | ICD-10-CM | POA: Insufficient documentation

## 2011-05-25 DIAGNOSIS — Z8249 Family history of ischemic heart disease and other diseases of the circulatory system: Secondary | ICD-10-CM

## 2011-05-25 DIAGNOSIS — E785 Hyperlipidemia, unspecified: Secondary | ICD-10-CM

## 2011-05-25 DIAGNOSIS — M8580 Other specified disorders of bone density and structure, unspecified site: Secondary | ICD-10-CM

## 2011-05-25 DIAGNOSIS — M899 Disorder of bone, unspecified: Secondary | ICD-10-CM

## 2011-05-25 DIAGNOSIS — Z1231 Encounter for screening mammogram for malignant neoplasm of breast: Secondary | ICD-10-CM

## 2011-05-25 DIAGNOSIS — Z0000 Encounter for general adult medical examination without abnormal findings: Secondary | ICD-10-CM

## 2011-05-25 DIAGNOSIS — Z0001 Encounter for general adult medical examination with abnormal findings: Secondary | ICD-10-CM | POA: Insufficient documentation

## 2011-05-25 DIAGNOSIS — H019 Unspecified inflammation of eyelid: Secondary | ICD-10-CM | POA: Insufficient documentation

## 2011-05-25 LAB — CBC WITH DIFFERENTIAL/PLATELET
Basophils Absolute: 0 10*3/uL (ref 0.0–0.1)
Basophils Relative: 0.4 % (ref 0.0–3.0)
Hemoglobin: 12.2 g/dL (ref 12.0–15.0)
Lymphocytes Relative: 27.5 % (ref 12.0–46.0)
Monocytes Relative: 6.4 % (ref 3.0–12.0)
Neutro Abs: 3 10*3/uL (ref 1.4–7.7)
RBC: 4.35 Mil/uL (ref 3.87–5.11)
WBC: 5.5 10*3/uL (ref 4.5–10.5)

## 2011-05-25 LAB — COMPREHENSIVE METABOLIC PANEL
ALT: 20 U/L (ref 0–35)
BUN: 9 mg/dL (ref 6–23)
CO2: 29 mEq/L (ref 19–32)
Calcium: 9 mg/dL (ref 8.4–10.5)
Chloride: 106 mEq/L (ref 96–112)
Creatinine, Ser: 0.7 mg/dL (ref 0.4–1.2)
GFR: 98.96 mL/min (ref >60.00)

## 2011-05-25 LAB — LDL CHOLESTEROL, DIRECT: Direct LDL: 121.7 mg/dL

## 2011-05-25 LAB — LIPID PANEL: Triglycerides: 122 mg/dL (ref 0.0–149.0)

## 2011-05-25 NOTE — Assessment & Plan Note (Signed)
Disc possible causes incl allergy to nail polish/ makeup or remover/ other products or environmental  Pt will change up some products and use moisturizer without color or fragrance and update

## 2011-05-25 NOTE — Patient Instructions (Signed)
We will schedule mammogram and dexa at check out Tdap today  Labs today  Don't forget to get your flu shot

## 2011-05-25 NOTE — Progress Notes (Signed)
Subjective:    Patient ID: Jenny Mcfarland, female    DOB: 16-Oct-1950, 60 y.o.   MRN: [id number]  HPI Here for health mt exam and also labs  Feels rotton today- may be catching uri -- grandkids are sick  Is rebounding   Pt will get her flu shot at work   bp stable 136/78  Wt is up 4 lb with bmi of 30 Is trying to eat a healthy diet - but worse in the summer (more junk foods) Enjoys walking for exercise 2-3 days per week   colonosc 4/07 - nl with hemmorhoids- 10 year f/u  Supposed to do EGD next spring   dexa 7/06- osteopenia  Ca and D Goes to [facility]   Pap 8/11 (we do not have report ) Due for 2 year check -- not enough cellularity last check Sister had breast cancer  Needs to set up at [facility]   Td- ? When it was - probably not in 10 years  Tdap   Mother had AAA- she herself has been screened twice  Sister had B ca Brother had early heart dz  She had stress test and cardiac work up -- at [facility] - 2 years ago    Mam 8/11 Self exam - no lumps or changes   Patient Active Problem List  Diagnoses  . GERD  . BARRETT'S ESOPHAGUS  . Diarrhea  . IBS (irritable bowel syndrome)  . Allergic rhinitis  . Asthma  . Mild hyperlipidemia  . Family history of breast cancer  . Family history of aortic aneurysm  . Osteopenia  . Gynecological examination  . Other screening mammogram  . Routine general medical examination at a health care facility  . Dermatitis, eyelid   Past Medical History  Diagnosis Date  . GERD (gastroesophageal reflux disease) 11/2008  . Peptic stricture of esophagus 11-2008  . Asthma   . Allergic rhinitis   . IBS (irritable bowel syndrome)   . Hemorrhoids   . Hiatal hernia   . Recurrent sinusitis   . Barrett's esophagus 2005  . History of chicken pox   . Osteopenia    Past Surgical History  Procedure Date  . Tubal ligation   . Cholecystectomy 2010   History  Substance Use Topics  . Smoking status: Never Smoker   . Smokeless tobacco:  Never Used  . Alcohol Use: No   Family History  Problem Relation Age of Onset  . Aortic aneurysm Mother   . Heart disease Mother   . Heart attack Brother     early 3's  . Arthritis Brother   . Heart disease Brother   . Colon cancer Neg Hx   . Breast cancer Sister   . Cancer Sister     breast cancer   Allergies  Allergen Reactions  . Levofloxacin    Current Outpatient Prescriptions on File Prior to Visit  Medication Sig Dispense Refill  . fexofenadine (ALLEGRA) 180 MG tablet Take 180 mg by mouth daily.        . Fluticasone-Salmeterol (ADVAIR DISKUS) 250-50 MCG/DOSE AEPB Inhale 1 puff into the lungs. One puff once daily       . lansoprazole (PREVACID SOLUTAB) 30 MG disintegrating tablet Take 1 tablet (30 mg total) by mouth 2 (two) times daily. Take one tablet dissolved under tongue two times a day  60 tablet  11  . budesonide-formoterol (SYMBICORT) 160-4.5 MCG/ACT inhaler Inhale 2 puffs into the lungs 2 (two) times daily.       Marland Kitchen  cholestyramine (QUESTRAN) 4 G packet Take 1 packet by mouth daily.  30 each  11      Review of Systems Review of Systems  Constitutional: Negative for fever, appetite change, fatigue and unexpected weight change.  Eyes: Negative for pain and visual disturbance.  Respiratory: Negative for cough and shortness of breath.   Cardiovascular: Negative for cp or palpitations    Gastrointestinal: Negative for nausea, diarrhea and constipation.  Genitourinary: Negative for urgency and frequency.  Skin: Negative for pallor, pos for dry skin on eyelids (no other rash) Neurological: Negative for weakness, light-headedness, numbness and headaches.  Hematological: Negative for adenopathy. Does not bruise/bleed easily.  Psychiatric/Behavioral: Negative for dysphoric mood. The patient is not nervous/anxious.          Objective:   Physical Exam  Constitutional: She appears well-developed and well-nourished. No distress.       overwt and well appearing   HENT:    Head: Normocephalic and atraumatic.  Right Ear: External ear normal.  Left Ear: External ear normal.  Nose: Nose normal.  Mouth/Throat: Oropharynx is clear and moist.  Eyes: Conjunctivae and EOM are normal. Pupils are equal, round, and reactive to light.  Neck: Normal range of motion. Neck supple. No JVD present. Carotid bruit is not present. No thyromegaly present.  Cardiovascular: Normal rate, regular rhythm, normal heart sounds and intact distal pulses.   Pulmonary/Chest: Effort normal and breath sounds normal. No respiratory distress. She has no wheezes. She exhibits no tenderness.  Abdominal: Soft. Bowel sounds are normal. She exhibits no distension and no mass. There is no tenderness.  Genitourinary: Vagina normal and uterus normal. No breast swelling, tenderness, discharge or bleeding. No vaginal discharge found.  Musculoskeletal: Normal range of motion. She exhibits no edema and no tenderness.  Lymphadenopathy:    She has no cervical adenopathy.  Neurological: She is alert. She has normal reflexes. No cranial nerve deficit. Coordination normal.  Skin: Skin is warm and dry. Rash noted. No erythema. No pallor.       Mild dryness/ scaling of skin on upper eyelids No open areas or swelling   Psychiatric: She has a normal mood and affect.          Assessment & Plan:

## 2011-05-25 NOTE — Assessment & Plan Note (Signed)
Reviewed health habits including diet and exercise and skin cancer prevention Also reviewed health mt list, fam hx and immunizations  Tdap today Will get flu shot at work

## 2011-05-25 NOTE — Assessment & Plan Note (Signed)
Done with pap Post cervix noted  No complaints No hx of abn paps or hpv

## 2011-05-25 NOTE — Assessment & Plan Note (Signed)
Schedule dexa today Rev ca and vit d Check D with labs  Disc imp of exercise No fx noted

## 2011-05-25 NOTE — Assessment & Plan Note (Signed)
Lab today Rev low sat fat diet in detail Enc exercise program

## 2011-05-25 NOTE — Assessment & Plan Note (Signed)
Mam scheduled  Nl exam  Enc monthly self exams Does have this in family

## 2011-05-25 NOTE — Assessment & Plan Note (Signed)
Pt has been screened appropriately on several occasions No symptoms Continue to follow

## 2011-05-26 LAB — VITAMIN D 25 HYDROXY (VIT D DEFICIENCY, FRACTURES): Vit D, 25-Hydroxy: 36 ng/mL (ref 30–89)

## 2011-05-28 ENCOUNTER — Telehealth: Payer: Self-pay | Admitting: *Deleted

## 2011-05-28 NOTE — Telephone Encounter (Signed)
I ordered those at her 9/25 visit ?

## 2011-05-28 NOTE — Telephone Encounter (Signed)
Will check with Shirlee Limerick about order on 05/25/11 visit.

## 2011-05-28 NOTE — Telephone Encounter (Signed)
Patient called and says she need order for bone density also

## 2011-05-28 NOTE — Telephone Encounter (Signed)
[facility] calling requesting fax for mammogram to be faxed to them at [phone]

## 2011-05-31 NOTE — Telephone Encounter (Signed)
Left message pts home # for pt to call back. [facility] closed now will try tomorrow.

## 2011-05-31 NOTE — Telephone Encounter (Signed)
MMG and bone [place] orders faxed to [facility] on 05/31/2011. MK

## 2011-06-01 NOTE — Telephone Encounter (Signed)
Spoke with Jenny Mcfarland at [facility] and they did receive order for mammo and bone density. Patient notified as instructed by telephone. Pt also requested copy of 09/25/121 labs mailed to home address. Copy of labs mailed as instructed.

## 2011-06-02 ENCOUNTER — Ambulatory Visit: Payer: Self-pay | Admitting: Family Medicine

## 2011-06-03 ENCOUNTER — Encounter: Payer: Self-pay | Admitting: Family Medicine

## 2011-06-04 ENCOUNTER — Encounter: Payer: Self-pay | Admitting: *Deleted

## 2011-06-07 ENCOUNTER — Encounter: Payer: Self-pay | Admitting: Family Medicine

## 2011-06-11 ENCOUNTER — Telehealth: Payer: Self-pay | Admitting: *Deleted

## 2011-06-11 NOTE — Telephone Encounter (Signed)
Patient requesting results of PAP. Informed her that all was normal and letter was mailed on 10/5

## 2011-07-16 ENCOUNTER — Other Ambulatory Visit: Payer: Self-pay | Admitting: *Deleted

## 2011-07-16 MED ORDER — ZOLPIDEM TARTRATE 5 MG PO TABS: 2.5000 mg | ORAL_TABLET | Freq: Every evening | ORAL | Status: DC | PRN

## 2011-07-16 NOTE — Telephone Encounter (Signed)
Px written for call in   

## 2011-07-19 NOTE — Telephone Encounter (Signed)
Medication phoned to [facility] pharmacy as instructed.  

## 2011-07-26 ENCOUNTER — Ambulatory Visit (INDEPENDENT_AMBULATORY_CARE_PROVIDER_SITE_OTHER): Payer: 59 | Admitting: Family Medicine

## 2011-07-26 ENCOUNTER — Encounter: Payer: Self-pay | Admitting: Family Medicine

## 2011-07-26 VITALS — BP 136/70 | HR 72 | Temp 98.4°F | Wt 183.2 lb

## 2011-07-26 DIAGNOSIS — J069 Acute upper respiratory infection, unspecified: Secondary | ICD-10-CM

## 2011-07-26 NOTE — Patient Instructions (Signed)
Sounds like you have a upper respiratory infection, likely viral. Antibiotics are not needed for this.  Viral infections usually take 7-10 days to resolve.  The cough can last several weeks to go away. Push fluids and plenty of rest. Please let me know if you have high fevers (>101.5) or difficulty swallowing or worsening productive cough. Let me know if more wheezing or shortness of breath. Continue advair, use nyquil at night for sleep, use nebulizer 2-3 times daily for next 3 days scheduled. Call clinic with questions.  Good to see you today.

## 2011-07-26 NOTE — Progress Notes (Signed)
  Subjective:    Patient ID: Jenny Mcfarland, female    DOB: [birth year], 60 y.o.   MRN: [id number]  HPI CC: ?flu  60yo with h/o asthma on advair presents with 5d h/o bad ST, feverish.  Then developed cough.  Last 2 days waking up with significant congestion, sneezing, blowing nose.  Today fatigued, continued cough mildly productive (tight), headache with cough.  Some wheezing,SOB.  Today rested all day.  Taking advil, advair, and neb at home.  Daughter dx with flu on Tuesday.  Pt had flu shot so wasn't worried about catching.  No smokers at home.  No abd pain, n/v, rashes, ear pain or tooth pain.  No body aches, joint pains.  Review of Systems Per HPI    Objective:   Physical Exam  Nursing note and vitals reviewed. Constitutional: She appears well-developed and well-nourished. No distress.  HENT:  Head: Normocephalic and atraumatic.  Right Ear: Hearing, tympanic membrane, external ear and ear canal normal.  Left Ear: Hearing, tympanic membrane, external ear and ear canal normal.  Nose: No mucosal edema or rhinorrhea. Right sinus exhibits no maxillary sinus tenderness and no frontal sinus tenderness. Left sinus exhibits no maxillary sinus tenderness and no frontal sinus tenderness.  Mouth/Throat: Uvula is midline and mucous membranes are normal. Posterior oropharyngeal erythema present. No oropharyngeal exudate, posterior oropharyngeal edema or tonsillar abscesses.       Cerumen bilaterally  Eyes: Conjunctivae and EOM are normal. Pupils are equal, round, and reactive to light. No scleral icterus.  Neck: Normal range of motion. Neck supple. No JVD present. No thyromegaly present.  Cardiovascular: Normal rate, regular rhythm, normal heart sounds and intact distal pulses.   No murmur heard. Pulmonary/Chest: Effort normal and breath sounds normal. No respiratory distress. She has no wheezes. She has no rales.       Sl decreased air movement but no wheezing  Lymphadenopathy:    She has no  cervical adenopathy.  Skin: Skin is warm and dry. No rash noted.       Assessment & Plan:

## 2011-07-26 NOTE — Assessment & Plan Note (Signed)
H/o asthma. Not consistent with flu like illness. Supportive care for now, scheduled neb for next 3 days. If fever >101 or worsening cough, to call me for zpack. If worsening SOB or wheezing, call me for prednisone course.

## 2011-08-02 ENCOUNTER — Telehealth: Payer: Self-pay | Admitting: Internal Medicine

## 2011-08-02 MED ORDER — AZITHROMYCIN 250 MG PO TABS: ORAL_TABLET | ORAL | Status: AC

## 2011-08-02 NOTE — Telephone Encounter (Signed)
Seen 11/26 with 3-4d h/o ST, congestion, cough, low grade fever. Now going on 10 days. Wasn't consistent with flu-like illness. Sounds like bronchitis, will treat with z-pack.  Sent in. Any SOB, wheezing?  Would consider steroid course if these sxs present

## 2011-08-02 NOTE — Telephone Encounter (Signed)
Patient called stating she seen Dr. Sharen Hones last Monday and he didn't think he didn't think she had the flu, she has been exposed twice to the flu and now she is having symptoms of sore throat and hoarse, fever from 100 to 101, congestion, body aches productive cough.  Please advise.   Pharmacy:  [facility]

## 2011-08-02 NOTE — Telephone Encounter (Signed)
Since he saw her - I'm going to route to Dr Reece Agar for his input

## 2011-08-02 NOTE — Telephone Encounter (Signed)
Thanks- in agreement

## 2011-08-02 NOTE — Telephone Encounter (Signed)
Spoke with patient and she advised she did have some slight wheezing occasionally, but her albuterol and advair seem to help with that. She said she would rather just stick with the z-pack for now because she doesn't like the side effects of the steroids. She will call back if she feels she needs them or if she needs a cough syrup.

## 2011-09-15 ENCOUNTER — Other Ambulatory Visit: Payer: Self-pay

## 2011-09-15 MED ORDER — ZOLPIDEM TARTRATE 5 MG PO TABS: 2.5000 mg | ORAL_TABLET | Freq: Every evening | ORAL | Status: DC | PRN

## 2011-09-15 NOTE — Telephone Encounter (Signed)
[facility] faxed refill request Zolpidem Tartrate 5 mg. Last filled 07/19/11 and pt last saw Dr Sharen Hones for URI and last saw Dr Milinda Antis 05/25/11 for ck up.Please advise.

## 2011-09-15 NOTE — Telephone Encounter (Signed)
Rx called in as directed.   

## 2011-09-15 NOTE — Telephone Encounter (Signed)
Px written for call in   

## 2011-11-05 ENCOUNTER — Encounter: Payer: Self-pay | Admitting: Gastroenterology

## 2011-11-22 ENCOUNTER — Other Ambulatory Visit: Payer: Self-pay

## 2011-11-22 MED ORDER — ZOLPIDEM TARTRATE 5 MG PO TABS: 2.5000 mg | ORAL_TABLET | Freq: Every evening | ORAL | Status: DC | PRN

## 2011-11-22 NOTE — Telephone Encounter (Signed)
[facility] faxed refill request for Zolpidem 5 mg. Pt last saw Dr Reece Agar on 07/26/11 and last saw Dr Milinda Antis 05/25/11.Please advise.

## 2011-11-22 NOTE — Telephone Encounter (Signed)
Rx called to [facility] pharmacy. 

## 2011-11-22 NOTE — Telephone Encounter (Signed)
Px written for call in   

## 2011-12-01 ENCOUNTER — Encounter: Payer: Self-pay | Admitting: Gastroenterology

## 2011-12-01 ENCOUNTER — Ambulatory Visit (INDEPENDENT_AMBULATORY_CARE_PROVIDER_SITE_OTHER): Payer: 59 | Admitting: Gastroenterology

## 2011-12-01 VITALS — BP 120/68 | HR 72 | Ht 66.0 in | Wt 183.0 lb

## 2011-12-01 DIAGNOSIS — K2270 Barrett's esophagus without dysplasia: Secondary | ICD-10-CM

## 2011-12-01 DIAGNOSIS — R197 Diarrhea, unspecified: Secondary | ICD-10-CM

## 2011-12-01 MED ORDER — LANSOPRAZOLE 30 MG PO TBDP: 30.0000 mg | ORAL_TABLET | Freq: Two times a day (BID) | ORAL | Status: DC

## 2011-12-01 NOTE — Progress Notes (Signed)
History of Present Illness: This is a 61 year old female who returns for followup of GERD, Barrett's esophagus and diarrhea. She has alternating bowel habits with occasional normal stools and occasional loose urgent stools. Her loose urgent stools began following her cholecystectomy. After her last office visit we tried Questran however she had significant epigastric and chest discomfort after taking Questran on 2 occasions and she therefore discontinued it. Her reflux symptoms are well controlled. Denies weight loss, abdominal pain, constipation, change in stool caliber, melena, hematochezia, nausea, vomiting, dysphagia, reflux symptoms, chest pain.  Current Medications, Allergies, Past Medical History, Past Surgical History, Family History and Social History were reviewed in [employer] record.  Physical Exam: General: Well developed , well nourished, no acute distress Head: Normocephalic and atraumatic Eyes:  sclerae anicteric, EOMI Ears: Normal auditory acuity Mouth: No deformity or lesions Lungs: Clear throughout to auscultation Heart: Regular rate and rhythm; no murmurs, rubs or bruits Abdomen: Soft, non tender and non distended. No masses, hepatosplenomegaly or hernias noted. Normal Bowel sounds Musculoskeletal: Symmetrical with no gross deformities  Pulses:  Normal pulses noted Extremities: No clubbing, cyanosis, edema or deformities noted Neurological: Alert oriented x 4, grossly nonfocal Psychological:  Alert and cooperative. Normal mood and affect  Assessment and Recommendations:  1. Barrett's esophagus. Continue standard antireflux measures and Prevacid Solutabs 30 mg twice daily. Schedule upper endoscopy for Barrett's surveillance. The risks, benefits, and alternatives to endoscopy with possible biopsy and possible dilation were discussed with the patient and they consent to proceed.   2. Diarrhea. Possible irritable bowel syndrome or postcholecystectomy  diarrhea. I advised her to proceed with colonoscopy for further evaluation however she declines and would like to try Imodium as needed. If the Imodium is not effective or if symptoms worsen she will agree to proceed with colonoscopy.

## 2011-12-01 NOTE — Patient Instructions (Signed)
You have been scheduled for an endoscopy with propofol. Please follow written instructions given to you at your visit today. We have sent the following medications to your pharmacy for you to pick up at your convenience:Prevacid Solutab. Take Imodium twice daily as needed for diarrhea.  cc: Roxy Manns, MD

## 2012-01-06 ENCOUNTER — Encounter: Payer: Self-pay | Admitting: Gastroenterology

## 2012-01-06 ENCOUNTER — Ambulatory Visit (AMBULATORY_SURGERY_CENTER): Payer: 59 | Admitting: Gastroenterology

## 2012-01-06 VITALS — BP 148/58 | HR 62 | Temp 96.7°F | Resp 18 | Ht 66.0 in | Wt 183.0 lb

## 2012-01-06 DIAGNOSIS — K2270 Barrett's esophagus without dysplasia: Secondary | ICD-10-CM

## 2012-01-06 DIAGNOSIS — K2100 Gastro-esophageal reflux disease with esophagitis, without bleeding: Secondary | ICD-10-CM

## 2012-01-06 DIAGNOSIS — K222 Esophageal obstruction: Secondary | ICD-10-CM

## 2012-01-06 DIAGNOSIS — K209 Esophagitis, unspecified without bleeding: Secondary | ICD-10-CM

## 2012-01-06 MED ORDER — SODIUM CHLORIDE 0.9 % IV SOLN: 500.0000 mL | INTRAVENOUS | Status: DC

## 2012-01-06 NOTE — Op Note (Signed)
[facility] [address] [place], [place]  [address]  ENDOSCOPY PROCEDURE REPORT PATIENT:  Jenny Mcfarland, Jenny Mcfarland  MR#:  [id number] BIRTHDATE:  30-Jan-1951, 61 yrs. old  GENDER:  female ENDOSCOPIST:  Judie Petit T. Russella Dar, MD, [facility]  PROCEDURE DATE:  01/06/2012 PROCEDURE:  EGD with biopsy, 62130 ASA CLASS:  Class II INDICATIONS:  Barrett's Esophagus MEDICATIONS:  MAC CRNA, propofol (Diprivan) 230 mg IV TOPICAL ANESTHETIC:  none DESCRIPTION OF PROCEDURE:   After the risks benefits and alternatives of the procedure were thoroughly explained, informed consent was obtained.  The LB GIF-H180 [id number] endoscope was introduced through the mouth and advanced to the second portion of the duodenum, without limitations.  The instrument was slowly withdrawn as the mucosa was fully examined. <<PROCEDUREIMAGES>> A stricture was found at the gastroesophageal junction. It was benign appearing.  Esophagitis was found in the distal esophagus. It was erosive, linear arrayed and erythematous. LA Class Grade B. Random biopsies were obtained and sent to pathology.  The stomach was entered and closely examined. The pylorus, antrum, angularis, and lesser curvature were well visualized, including a retroflexed view of the cardia and fundus. The stomach wall was normally distensable. The scope passed easily through the pylorus into the duodenum.  The duodenal bulb was normal in appearance, as was the postbulbar duodenum.  Barrett's esophagus was found in the distal esophagus. It was 5 mm in size. Multiple biopsies were obtained and sent to pathology.   Otherwise normal esophagus, Retroflexed views revealed a hiatal hernia, 4 cm  The scope was then withdrawn from the patient and the procedure completed.  COMPLICATIONS:  None  ENDOSCOPIC IMPRESSION: 1) Stricture at the gastroesophageal junction 2) Erosive esophagitis 3) 5 mm barrett's esophagus 4) Hiatal hernia  RECOMMENDATIONS: 1) Anti-reflux  regimen 2) Await pathology results 3) PPI bid: Prevacid 30 mg solutabs, 1 PO BID, for 1 year  Wladyslawa Disbro T. Russella Dar, MD, [facility]  n. eSIGNED:   Venita Lick. Cristen Murcia at 01/06/2012 10:15 AM  Luther Parody, [phone]

## 2012-01-06 NOTE — Progress Notes (Signed)
Patient did not have preoperative order for IV antibiotic SSI prophylaxis. (G8918)  Patient did not experience any of the following events: a burn prior to discharge; a fall within the facility; wrong site/side/patient/procedure/implant event; or a hospital transfer or hospital admission upon discharge from the facility. (G8907)  

## 2012-01-06 NOTE — Patient Instructions (Signed)

## 2012-01-07 ENCOUNTER — Telehealth: Payer: Self-pay | Admitting: *Deleted

## 2012-01-07 NOTE — Telephone Encounter (Signed)
  Follow up Call-  Call back number 01/06/2012  Post procedure Call Back phone  # [phone]  Permission to leave phone message Yes     Patient questions:  Do you have a fever, pain , or abdominal swelling? no Pain Score  0 *  Have you tolerated food without any problems? yes  Have you been able to return to your normal activities? yes  Do you have any questions about your discharge instructions: Diet   no Medications  no Follow up visit  no  Do you have questions or concerns about your Care? no  Actions: * If pain score is 4 or above: No action needed, pain <4.

## 2012-01-13 ENCOUNTER — Encounter: Payer: Self-pay | Admitting: Gastroenterology

## 2012-01-19 ENCOUNTER — Telehealth: Payer: Self-pay | Admitting: Gastroenterology

## 2012-01-19 NOTE — Telephone Encounter (Signed)
I have reviewed.  

## 2012-01-19 NOTE — Telephone Encounter (Signed)
I have explained the letter and answered all her questions.  She will call back for any further questions.

## 2012-03-08 ENCOUNTER — Telehealth: Payer: Self-pay | Admitting: Gastroenterology

## 2012-03-09 NOTE — Telephone Encounter (Signed)
Attempted to do PA online and the PA will be faxed shortly.

## 2012-03-10 NOTE — Telephone Encounter (Signed)
Called [employer] since I have not received the fax PA form and coverage was approved for Prevacid Solutab. The coverage dates are 01/2012-01/2013.

## 2012-05-16 ENCOUNTER — Telehealth: Payer: Self-pay

## 2012-05-16 DIAGNOSIS — Z1231 Encounter for screening mammogram for malignant neoplasm of breast: Secondary | ICD-10-CM

## 2012-05-16 NOTE — Telephone Encounter (Signed)
Looks like last one was at [facility] Will refer  Let her know pt care coordinator will be calling her to set it up

## 2012-05-16 NOTE — Telephone Encounter (Signed)
Notified pt that referral was made and will be getting a call from pt care coordinator to set it up

## 2012-05-16 NOTE — Telephone Encounter (Signed)
Pt left v/m requesting mammogram referral or order for Oct 2013; pts sister had breast CA x 2. Pt could not get CPX until 09/11/12.Please advise.

## 2012-06-05 ENCOUNTER — Ambulatory Visit: Payer: Self-pay | Admitting: Family Medicine

## 2012-06-06 ENCOUNTER — Encounter: Payer: Self-pay | Admitting: Family Medicine

## 2012-06-08 ENCOUNTER — Encounter: Payer: Self-pay | Admitting: Family Medicine

## 2012-06-08 ENCOUNTER — Encounter: Payer: Self-pay | Admitting: *Deleted

## 2012-06-21 ENCOUNTER — Ambulatory Visit (INDEPENDENT_AMBULATORY_CARE_PROVIDER_SITE_OTHER): Payer: 59

## 2012-06-21 DIAGNOSIS — Z23 Encounter for immunization: Secondary | ICD-10-CM

## 2012-08-14 ENCOUNTER — Other Ambulatory Visit: Payer: Self-pay | Admitting: *Deleted

## 2012-08-14 MED ORDER — ZOLPIDEM TARTRATE 5 MG PO TABS: 2.5000 mg | ORAL_TABLET | Freq: Every evening | ORAL | Status: DC | PRN

## 2012-08-14 NOTE — Telephone Encounter (Signed)
Rx called in as prescribed 

## 2012-08-14 NOTE — Telephone Encounter (Signed)
Px written for call in   

## 2012-09-11 ENCOUNTER — Ambulatory Visit (INDEPENDENT_AMBULATORY_CARE_PROVIDER_SITE_OTHER): Payer: 59 | Admitting: Family Medicine

## 2012-09-11 ENCOUNTER — Encounter: Payer: Self-pay | Admitting: Family Medicine

## 2012-09-11 VITALS — BP 124/68 | HR 78 | Temp 98.4°F | Ht 64.75 in | Wt 189.8 lb

## 2012-09-11 DIAGNOSIS — Z0000 Encounter for general adult medical examination without abnormal findings: Secondary | ICD-10-CM

## 2012-09-11 DIAGNOSIS — M8580 Other specified disorders of bone density and structure, unspecified site: Secondary | ICD-10-CM

## 2012-09-11 DIAGNOSIS — M899 Disorder of bone, unspecified: Secondary | ICD-10-CM

## 2012-09-11 DIAGNOSIS — E785 Hyperlipidemia, unspecified: Secondary | ICD-10-CM

## 2012-09-11 NOTE — Assessment & Plan Note (Signed)
Due for dexa in the fall No falls/ fx Disc ca and D D level today No kyphosis

## 2012-09-11 NOTE — Progress Notes (Signed)
Subjective:    Patient ID: Jenny Mcfarland, female    DOB: 1950/12/01, 62 y.o.   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 Disc pneumovax    Osteopenia - thinks it was 2012  Exercise - chases children - no regular Takes ca and D (less Ca than D due to constipation) No fx or falls   Mood - is very good - has kept pretty positive   Needs to have labs drawn today  Hx of lipids Lab Results  Component Value Date   CHOL 209* 05/25/2011   HDL 63.60 05/25/2011   LDLDIRECT 121.7 05/25/2011   TRIG 122.0 05/25/2011   CHOLHDL 3 05/25/2011        Patient Active Problem List  Diagnosis  . GERD  . BARRETT'S ESOPHAGUS  . Diarrhea  . IBS (irritable bowel syndrome)  . Allergic rhinitis  . Asthma  . Mild hyperlipidemia  . Family history of breast cancer  . Family history of aortic aneurysm  . Osteopenia  . Gynecological examination  . Other screening mammogram  . Routine general medical examination at a health care facility   Past Medical History  Diagnosis Date  . GERD (gastroesophageal reflux disease) 11/2008  . Peptic stricture of esophagus 11-2008  . Asthma   . Allergic rhinitis   . IBS (irritable bowel syndrome)   . Hemorrhoids   . Hiatal hernia   .  Recurrent sinusitis   . Barrett's esophagus 2005  . History of chicken pox   . Osteopenia    Past Surgical History  Procedure Date  . Tubal ligation   . Cholecystectomy 2010   History  Substance Use Topics  . Smoking status: Never Smoker   . Smokeless tobacco: Never Used  . Alcohol Use: No   Family History  Problem Relation Age of Onset  . Aortic aneurysm Mother   . Heart disease Mother   . Heart attack Brother     early 72's  . Arthritis Brother   . Heart disease Brother   . Colon cancer Neg Hx   . Breast cancer Sister   . Cancer Sister     breast cancer   Allergies  Allergen Reactions  . Levofloxacin Itching    Felt like she had bugs crawling all over her.   Current Outpatient Prescriptions on File Prior to Visit  Medication Sig Dispense Refill  . fexofenadine (ALLEGRA) 180 MG tablet Take 180 mg by mouth daily.        . Fluticasone-Salmeterol (ADVAIR DISKUS) 250-50 MCG/DOSE AEPB Inhale 1 puff into  the lungs. One puff once daily       . lansoprazole (PREVACID SOLUTAB) 30 MG disintegrating tablet Take 1 tablet (30 mg total) by mouth 2 (two) times daily. Take one tablet dissolved under tongue two times a day  60 tablet  11  . zolpidem (AMBIEN) 5 MG tablet Take 0.5-1 tablets (2.5-5 mg total) by mouth at bedtime as needed.  30 tablet  3    Review of Systems Review of Systems  Constitutional: Negative for fever, appetite change, fatigue and unexpected weight change.  ENT pos for runny nose and sore throat/ neg for facial pain  Eyes: Negative for pain and visual disturbance.  Respiratory: Negative for cough and shortness of breath.   Cardiovascular: Negative for cp or palpitations    Gastrointestinal: Negative for nausea, diarrhea and constipation.  Genitourinary: Negative for urgency and frequency.  Skin: Negative for pallor or rash   Neurological: Negative for weakness, light-headedness, numbness and headaches.  Hematological: Negative for adenopathy. Does not  bruise/bleed easily.  Psychiatric/Behavioral: Negative for dysphoric mood. The patient is not nervous/anxious.         Objective:   Physical Exam  Constitutional: She appears well-developed and well-nourished. No distress.       obese and well appearing   HENT:  Head: Normocephalic and atraumatic.  Right Ear: External ear normal.  Left Ear: External ear normal.  Nose: Nose normal.  Mouth/Throat: Oropharynx is clear and moist.       Nares are boggy with clear rhinorrhea  Eyes: Conjunctivae normal and EOM are normal. Pupils are equal, round, and reactive to light. Right eye exhibits no discharge. Left eye exhibits no discharge. No scleral icterus.  Neck: Normal range of motion. Neck supple. No JVD present. Carotid bruit is not present. No thyromegaly present.  Cardiovascular: Normal rate, regular rhythm, normal heart sounds and intact distal pulses.  Exam reveals no gallop.   Pulmonary/Chest: Effort normal and breath sounds normal. No respiratory distress. She has no wheezes.  Abdominal: Soft. Bowel sounds are normal. She exhibits no distension, no abdominal bruit and no mass. There is no tenderness.  Genitourinary: No breast swelling, tenderness, discharge or bleeding.       Breast exam: No mass, nodules, thickening, tenderness, bulging, retraction, inflamation, nipple discharge or skin changes noted.  No axillary or clavicular LA.  Chaperoned exam.    Musculoskeletal: She exhibits no edema and no tenderness.  Lymphadenopathy:    She has no cervical adenopathy.  Neurological: She is alert. She has normal reflexes. No cranial nerve deficit. She exhibits normal muscle tone. Coordination normal.  Skin: Skin is warm and dry. No rash noted. No erythema. No pallor.  Psychiatric: She has a normal mood and affect.          Assessment & Plan:

## 2012-09-11 NOTE — Patient Instructions (Addendum)
If you are interested in a shingles/zoster vaccine - call your insurance to check on coverage,( you should not get it within 1 month of other vaccines) , then call us for a prescription  for it to take to a pharmacy that gives the shot , or make a nurse visit to get it here depending on your coverage Talk to Dr [place] Callas about a pneumonia vaccine - I think that would be a good idea Labs today  Work on healthy diet/ exercise and weight loss

## 2012-09-11 NOTE — Assessment & Plan Note (Signed)
Reviewed health habits including diet and exercise and skin cancer prevention Also reviewed health mt list, fam hx and immunizations  Wellness labs today  She will speak to her allergist about a pneumonia vaccine -not sure if she wants it - I enc it due to asthma She is not yet interested in shingles vaccine

## 2012-09-11 NOTE — Assessment & Plan Note (Signed)
Lab today Rev low sat fat diet  Will work harder on this

## 2012-09-12 LAB — COMPREHENSIVE METABOLIC PANEL
ALT: 20 U/L (ref 0–35)
AST: 18 U/L (ref 0–37)
Albumin: 3.9 g/dL (ref 3.5–5.2)
BUN: 9 mg/dL (ref 6–23)
Calcium: 9.4 mg/dL (ref 8.4–10.5)
Chloride: 106 mEq/L (ref 96–112)
Potassium: 4.5 mEq/L (ref 3.5–5.1)
Total Protein: 7.4 g/dL (ref 6.0–8.3)

## 2012-09-12 LAB — LIPID PANEL
Cholesterol: 214 mg/dL — ABNORMAL HIGH (ref 0–200)
Total CHOL/HDL Ratio: 4

## 2012-09-12 LAB — CBC WITH DIFFERENTIAL/PLATELET
Basophils Relative: 0.5 % (ref 0.0–3.0)
Eosinophils Absolute: 0.7 10*3/uL (ref 0.0–0.7)
Lymphocytes Relative: 23.4 % (ref 12.0–46.0)
MCHC: 33.1 g/dL (ref 30.0–36.0)
Neutrophils Relative %: 61.4 % (ref 43.0–77.0)
RBC: 4.43 Mil/uL (ref 3.87–5.11)
WBC: 6.8 10*3/uL (ref 4.5–10.5)

## 2012-09-12 LAB — VITAMIN D 25 HYDROXY (VIT D DEFICIENCY, FRACTURES): Vit D, 25-Hydroxy: 24 ng/mL — ABNORMAL LOW (ref 30–89)

## 2012-09-12 LAB — LDL CHOLESTEROL, DIRECT: Direct LDL: 125.5 mg/dL

## 2012-09-13 ENCOUNTER — Encounter: Payer: Self-pay | Admitting: *Deleted

## 2012-12-04 ENCOUNTER — Ambulatory Visit (INDEPENDENT_AMBULATORY_CARE_PROVIDER_SITE_OTHER): Payer: 59 | Admitting: Gastroenterology

## 2012-12-04 ENCOUNTER — Encounter: Payer: Self-pay | Admitting: Gastroenterology

## 2012-12-04 VITALS — BP 126/72 | HR 84 | Ht 64.75 in | Wt 190.5 lb

## 2012-12-04 DIAGNOSIS — R197 Diarrhea, unspecified: Secondary | ICD-10-CM

## 2012-12-04 DIAGNOSIS — R1310 Dysphagia, unspecified: Secondary | ICD-10-CM

## 2012-12-04 DIAGNOSIS — Z1211 Encounter for screening for malignant neoplasm of colon: Secondary | ICD-10-CM

## 2012-12-04 DIAGNOSIS — K2270 Barrett's esophagus without dysplasia: Secondary | ICD-10-CM

## 2012-12-04 MED ORDER — LANSOPRAZOLE 30 MG PO TBDP: 30.0000 mg | ORAL_TABLET | Freq: Two times a day (BID) | ORAL | Status: DC

## 2012-12-04 NOTE — Patient Instructions (Addendum)
We have sent the following medications to your pharmacy for you to pick up at your convenience: Prevacid Solutab.  Thank you for choosing me and [facility].  Venita Lick. Pleas Koch., MD., [facility]

## 2012-12-04 NOTE — Progress Notes (Signed)
History of Present Illness: This is a 61 year old female with a history of IBS and frequent diarrhea since her cholecystectomy. She could not tolerate Questran. She is currently using Imodium twice a day as needed. She has occasional problems with regurgitation but generally her reflux symptoms are under good control. She is a prior history of Barrett's however her last endoscopy did not show Barrett's. She notes occasional problems with coughing when swallowing symptoms present for about 2 years.  Current Medications, Allergies, Past Medical History, Past Surgical History, Family History and Social History were reviewed in [employer] record.  Physical Exam: General: Well developed , well nourished, no acute distress Head: Normocephalic and atraumatic Eyes:  sclerae anicteric, EOMI Ears: Normal auditory acuity Mouth: No deformity or lesions Lungs: Clear throughout to auscultation Heart: Regular rate and rhythm; no murmurs, rubs or bruits Abdomen: Soft, non tender and non distended. No masses, hepatosplenomegaly or hernias noted. Normal Bowel sounds Musculoskeletal: Symmetrical with no gross deformities  Pulses:  Normal pulses noted Extremities: No clubbing, cyanosis, edema or deformities noted Neurological: Alert oriented x 4, grossly nonfocal Psychological:  Alert and cooperative. Normal mood and affect  Assessment and Recommendations:  1. History of Barrett's esophagus, which was not identified on her endoscopy in May 2013. History of erosive esophagitis. Continue Prevacid 30 mg twice a day and standard antireflux measures. Surveillance EGD at time of her colonoscopy in April 2017.  2. Colorectal cancer screening, average risk. 10 year screening colonoscopy is due April 2017.  3. Diarrhea. IBS and postcholecystectomy.  Continue Imodium twice a day when necessary.  4. Dysphagia, symptoms typical for an oropharyngeal etiology. Offered to proceed with a modified  barium swallow study however she declines and states she will call back if her symptoms worsen.

## 2013-02-23 ENCOUNTER — Other Ambulatory Visit: Payer: Self-pay | Admitting: Family Medicine

## 2013-02-23 NOTE — Telephone Encounter (Signed)
Last filled 01/23/13

## 2013-02-24 NOTE — Telephone Encounter (Signed)
Please refill times 3 

## 2013-02-26 MED ORDER — ZOLPIDEM TARTRATE 5 MG PO TABS: 2.5000 mg | ORAL_TABLET | Freq: Every evening | ORAL | Status: DC | PRN

## 2013-02-26 NOTE — Telephone Encounter (Signed)
Done, Rx called in as prescribed 

## 2013-03-26 ENCOUNTER — Telehealth: Payer: Self-pay | Admitting: Gastroenterology

## 2013-03-26 MED ORDER — DEXLANSOPRAZOLE 60 MG PO CPDR: 60.0000 mg | DELAYED_RELEASE_CAPSULE | Freq: Every day | ORAL | Status: DC

## 2013-03-26 NOTE — Telephone Encounter (Signed)
I have called and spoken to representative at [facility]. Prevacid has been denied as per our electronic records, patient has only tried pantoprazole in the past. Patient states that she has tried all other PPI's other than Prevacid in the past and they have not worked. I explained that we must have documentation of those medication that she has failed and we only have listed pantoprazole. Insurance will cover omeprazole, pantoprazole, Aciphex and Dexilant. Patient will try Dexilant first.

## 2013-03-26 NOTE — Telephone Encounter (Signed)
Dexilant script went through at pharmacy with $50 copay. I have given a coupon code for patient to pay 20 dollars for rx. Patient has been advised.

## 2013-03-26 NOTE — Telephone Encounter (Signed)
I have contacted patient's insurance and have given information for prior auth. Information is being sent to pharmacist for review. I will advise patient once I hear back from the insurance company regarding approval or denial.

## 2013-04-19 ENCOUNTER — Telehealth: Payer: Self-pay | Admitting: Gastroenterology

## 2013-04-19 MED ORDER — LANSOPRAZOLE 30 MG PO TBDP: 30.0000 mg | ORAL_TABLET | Freq: Two times a day (BID) | ORAL | Status: DC

## 2013-04-19 NOTE — Telephone Encounter (Signed)
Patient states the Dexilant did control her reflux symptoms but she did have other side effects such as constipation, joint pain and bloating. Patient states she would rather be switched back to Prevacid since that medication worked the best. Told patient that her insurance company did not approve Prevacid in the past and has preferred medications they want her to take. She states she was under the impression that if she tries and fails two medications then her insurance company will pay for it. Told her I will send Prevacid to her pharmacy and try the PA again when her pharmacy contacts me. She states she does have several days of the Dexilant left to take while she waits on the PA to be done.

## 2013-04-23 ENCOUNTER — Telehealth: Payer: Self-pay | Admitting: Gastroenterology

## 2013-04-23 NOTE — Telephone Encounter (Signed)
Told patient that I have not heard from her insurance company yet but I did fax over the PA form on 04/19/13. Told patient I will call and get the status if I have not heard from them by the end of the day.

## 2013-05-14 ENCOUNTER — Ambulatory Visit: Payer: 59 | Admitting: Family Medicine

## 2013-05-21 ENCOUNTER — Encounter: Payer: Self-pay | Admitting: Internal Medicine

## 2013-05-21 ENCOUNTER — Ambulatory Visit (INDEPENDENT_AMBULATORY_CARE_PROVIDER_SITE_OTHER): Payer: 59 | Admitting: Internal Medicine

## 2013-05-21 VITALS — BP 138/80 | HR 64 | Temp 98.6°F | Wt 185.0 lb

## 2013-05-21 DIAGNOSIS — IMO0002 Reserved for concepts with insufficient information to code with codable children: Secondary | ICD-10-CM

## 2013-05-21 MED ORDER — VALACYCLOVIR HCL 1 G PO TABS: 1000.0000 mg | ORAL_TABLET | Freq: Three times a day (TID) | ORAL | Status: DC

## 2013-05-21 MED ORDER — GABAPENTIN 300 MG PO CAPS: 300.0000 mg | ORAL_CAPSULE | Freq: Three times a day (TID) | ORAL | Status: DC

## 2013-05-21 NOTE — Progress Notes (Signed)
Subjective:    Patient ID: Jenny Mcfarland, female    DOB: [birth year], 62 y.o.   MRN: [id number]  HPI Concerned she has shingles Started about 10 days ago Pain along right shoulder blade---like "someone pushing with a knife" Initially sharp--then just achy Seemed to fade away Thought it might be reflux or stress  3 days ago, came back severely with the sharp knife like pain and burning Pain so bad it can be nauseated Arm feels tender to touch Pain now into axilla at the same level No fever  Tried 2 advil yesterday and today May have helped a little 3 nights ago--but not today and last night  Current Outpatient Prescriptions on File Prior to Visit  Medication Sig Dispense Refill  . fexofenadine (ALLEGRA) 180 MG tablet Take 180 mg by mouth daily.        . Fluticasone-Salmeterol (ADVAIR DISKUS) 250-50 MCG/DOSE AEPB Inhale 1 puff into the lungs. One puff once daily       . lansoprazole (PREVACID SOLUTAB) 30 MG disintegrating tablet Take 1 tablet (30 mg total) by mouth 2 (two) times daily.  60 tablet  5  . zolpidem (AMBIEN) 5 MG tablet Take 0.5-1 tablets (2.5-5 mg total) by mouth at bedtime as needed.  30 tablet  2   No current facility-administered medications on file prior to visit.    Allergies  Allergen Reactions  . Levofloxacin Itching    Felt like she had bugs crawling all over her.    Past Medical History  Diagnosis Date  . GERD (gastroesophageal reflux disease) 11/2008  . Peptic stricture of esophagus 11-2008  . Asthma   . Allergic rhinitis   . IBS (irritable bowel syndrome)   . Hemorrhoids   . Hiatal hernia   . Recurrent sinusitis   . Barrett's esophagus 2005  . History of chicken pox   . Osteopenia     Past Surgical History  Procedure Laterality Date  . Tubal ligation    . Cholecystectomy  2010    Family History  Problem Relation Age of Onset  . Aortic aneurysm Mother   . Heart disease Mother   . Heart attack Brother     early 30's  . Arthritis  Brother   . Heart disease Brother   . Colon cancer Neg Hx   . Breast cancer Sister   . Cancer Sister     breast cancer    History   Social History  . Marital Status: Married    Spouse Name: N/A    Number of Children: 2  . Years of Education: N/A   Occupational History  . 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 Smoking status: Never Smoker   . Smokeless tobacco: Never Used  . Alcohol Use: No  . Drug Use: No  . Sexual Activity: Not on file   Other Topics Concern  . Not on file   Social History Narrative   Daily caffeine use - soft drinks/tea    Review of Systems No gagging, water brash or heartburn No apparent brash Headache about 2 weeks ago No cough or SOB    Objective:   Physical Exam  Constitutional: She appears well-developed and well-nourished. No distress.  Pulmonary/Chest: Effort normal and breath sounds normal. No respiratory distress. She has no wheezes. She has no rales.  Neurological:  Different sensation ~T6 on the left  Skin: No rash noted.          Assessment & Plan:

## 2013-05-21 NOTE — Assessment & Plan Note (Signed)
She is rightly concerned about shingles---presentation fairly classic except for no rash Discussed the lack of clear guidance on anti virals,etc in this setting Given the classic nerve pain, will treat with valacyclovir and gabapentin (at least briefly)

## 2013-05-21 NOTE — Patient Instructions (Signed)
Please take the antiviral valacyclovir for the full 10 days. Start the nerve pain medication, gabapentin, 1 capsule at bedtime for 3 days. If the pain persists, please increase to 2 capsules at bedtime. If the pain continues, contact Dr Milinda Antis about the next steps.

## 2013-05-22 ENCOUNTER — Telehealth: Payer: Self-pay

## 2013-05-22 DIAGNOSIS — M8580 Other specified disorders of bone density and structure, unspecified site: Secondary | ICD-10-CM

## 2013-05-22 NOTE — Telephone Encounter (Signed)
I will order that 

## 2013-05-22 NOTE — Telephone Encounter (Signed)
Pt left note needs order for bone density at [facility] same day as mammogram; pt already scheduled screening mammogram 06/11/13 at [facility].Please advise.

## 2013-05-22 NOTE — Telephone Encounter (Signed)
Left voicemail letting pt know Dr. Milinda Antis put order in for bone density

## 2013-06-13 ENCOUNTER — Ambulatory Visit (INDEPENDENT_AMBULATORY_CARE_PROVIDER_SITE_OTHER): Payer: 59

## 2013-06-13 DIAGNOSIS — Z23 Encounter for immunization: Secondary | ICD-10-CM

## 2013-06-20 ENCOUNTER — Ambulatory Visit: Payer: Self-pay | Admitting: Family Medicine

## 2013-06-21 ENCOUNTER — Encounter: Payer: Self-pay | Admitting: Family Medicine

## 2013-07-11 ENCOUNTER — Ambulatory Visit: Payer: Self-pay | Admitting: Family Medicine

## 2013-07-20 ENCOUNTER — Encounter: Payer: Self-pay | Admitting: Family Medicine

## 2013-07-30 ENCOUNTER — Other Ambulatory Visit: Payer: Self-pay | Admitting: *Deleted

## 2013-07-30 MED ORDER — ZOLPIDEM TARTRATE 5 MG PO TABS: 2.5000 mg | ORAL_TABLET | Freq: Every evening | ORAL | Status: DC | PRN

## 2013-07-30 NOTE — Telephone Encounter (Signed)
Rx called in as prescribed 

## 2013-07-30 NOTE — Telephone Encounter (Signed)
Px written for call in   

## 2013-07-30 NOTE — Telephone Encounter (Signed)
Received faxed refill request from pharmacy. Last office visit 05/21/13/acute. Is it okay to refill medication

## 2013-09-12 ENCOUNTER — Encounter: Payer: 59 | Admitting: Family Medicine

## 2013-09-12 ENCOUNTER — Other Ambulatory Visit: Payer: 59

## 2013-09-14 ENCOUNTER — Encounter: Payer: Self-pay | Admitting: Radiology

## 2013-09-17 ENCOUNTER — Ambulatory Visit (INDEPENDENT_AMBULATORY_CARE_PROVIDER_SITE_OTHER): Payer: BC Managed Care – PPO | Admitting: Family Medicine

## 2013-09-17 ENCOUNTER — Encounter: Payer: Self-pay | Admitting: Family Medicine

## 2013-09-17 VITALS — BP 126/74 | HR 57 | Temp 97.6°F | Ht 65.0 in | Wt 179.5 lb

## 2013-09-17 DIAGNOSIS — M899 Disorder of bone, unspecified: Secondary | ICD-10-CM

## 2013-09-17 DIAGNOSIS — Z0000 Encounter for general adult medical examination without abnormal findings: Secondary | ICD-10-CM

## 2013-09-17 DIAGNOSIS — M8580 Other specified disorders of bone density and structure, unspecified site: Secondary | ICD-10-CM

## 2013-09-17 DIAGNOSIS — E785 Hyperlipidemia, unspecified: Secondary | ICD-10-CM

## 2013-09-17 DIAGNOSIS — M949 Disorder of cartilage, unspecified: Secondary | ICD-10-CM

## 2013-09-17 LAB — COMPREHENSIVE METABOLIC PANEL
ALBUMIN: 3.7 g/dL (ref 3.5–5.2)
ALT: 18 U/L (ref 0–35)
AST: 18 U/L (ref 0–37)
Alkaline Phosphatase: 78 U/L (ref 39–117)
BUN: 9 mg/dL (ref 6–23)
CO2: 27 mEq/L (ref 19–32)
Calcium: 9.3 mg/dL (ref 8.4–10.5)
Chloride: 105 mEq/L (ref 96–112)
Creatinine, Ser: 0.8 mg/dL (ref 0.4–1.2)
GFR: 75.1 mL/min (ref >60.00)
Glucose, Bld: 88 mg/dL (ref 70–99)
POTASSIUM: 4.3 meq/L (ref 3.5–5.1)
SODIUM: 141 meq/L (ref 135–145)
TOTAL PROTEIN: 7.3 g/dL (ref 6.0–8.3)
Total Bilirubin: 0.6 mg/dL (ref 0.3–1.2)

## 2013-09-17 LAB — CBC WITH DIFFERENTIAL/PLATELET
BASOS ABS: 0 10*3/uL (ref 0.0–0.1)
Basophils Relative: 0.5 % (ref 0.0–3.0)
EOS ABS: 0.6 10*3/uL (ref 0.0–0.7)
Eosinophils Relative: 9.8 % — ABNORMAL HIGH (ref 0.0–5.0)
HCT: 37 % (ref 36.0–46.0)
HEMOGLOBIN: 12.2 g/dL (ref 12.0–15.0)
LYMPHS ABS: 1.8 10*3/uL (ref 0.7–4.0)
LYMPHS PCT: 29 % (ref 12.0–46.0)
MCHC: 33.1 g/dL (ref 30.0–36.0)
MCV: 83.6 fl (ref 78.0–100.0)
Monocytes Absolute: 0.4 10*3/uL (ref 0.1–1.0)
Monocytes Relative: 6.4 % (ref 3.0–12.0)
NEUTROS ABS: 3.4 10*3/uL (ref 1.4–7.7)
Neutrophils Relative %: 54.3 % (ref 43.0–77.0)
Platelets: 251 10*3/uL (ref 150.0–400.0)
RBC: 4.42 Mil/uL (ref 3.87–5.11)
RDW: 14.2 % (ref 11.5–14.6)
WBC: 6.2 10*3/uL (ref 4.5–10.5)

## 2013-09-17 LAB — TSH: TSH: 1.35 u[IU]/mL (ref 0.35–5.50)

## 2013-09-17 LAB — LIPID PANEL
Cholesterol: 212 mg/dL — ABNORMAL HIGH (ref 0–200)
HDL: 64.3 mg/dL (ref >39.00)
Total CHOL/HDL Ratio: 3
Triglycerides: 106 mg/dL (ref 0.0–149.0)
VLDL: 21.2 mg/dL (ref 0.0–40.0)

## 2013-09-17 LAB — LDL CHOLESTEROL, DIRECT: LDL DIRECT: 135.4 mg/dL

## 2013-09-17 MED ORDER — FLUTICASONE-SALMETEROL 250-50 MCG/DOSE IN AEPB: 1.0000 | INHALATION_SPRAY | Freq: Two times a day (BID) | RESPIRATORY_TRACT | Status: DC

## 2013-09-17 MED ORDER — LANSOPRAZOLE 30 MG PO TBDP: 30.0000 mg | ORAL_TABLET | Freq: Two times a day (BID) | ORAL | Status: DC

## 2013-09-17 NOTE — Assessment & Plan Note (Signed)
Rev last dexa Disc need for calcium/ vitamin D/ wt bearing exercise and bone density test every 2 y to monitor Disc safety/ fracture risk in detail   No recent of fragility fx  May be candidate for fx prevention tx in the future

## 2013-09-17 NOTE — Progress Notes (Signed)
Pre-visit discussion using our clinic review tool. No additional management support is needed unless otherwise documented below in the visit note.  

## 2013-09-17 NOTE — Patient Instructions (Signed)
Take care of yourself  Make sure to take your vitamin D daily and calcium when you can  Lab today Get back to exercise when you can     Osteoporosis Throughout your life, your body breaks down old bone and replaces it with new bone. As you get older, your body does not replace bone as quickly as it breaks it down. By the age of 2 years, most people begin to gradually lose bone because of the imbalance between bone loss and replacement. Some people lose more bone than others. Bone loss beyond a specified normal degree is considered osteoporosis.  Osteoporosis affects the strength and durability of your bones. The inside of the ends of your bones and your flat bones, like the bones of your pelvis, look like honeycomb, filled with tiny open spaces. As bone loss occurs, your bones become less dense. This means that the open spaces inside your bones become bigger and the walls between these spaces become thinner. This makes your bones weaker. Bones of a person with osteoporosis can become so weak that they can break (fracture) during minor accidents, such as a simple fall. CAUSES  The following factors have been associated with the development of osteoporosis:  Smoking.  Drinking more than 2 alcoholic drinks several days per week.  Long-term use of certain medicines:  Corticosteroids.  Chemotherapy medicines.  Thyroid medicines.  Antiepileptic medicines.  Gonadal hormone suppression medicine.  Immunosuppression medicine.  Being underweight.  Lack of physical activity.  Lack of exposure to the sun. This can lead to vitamin D deficiency.  Certain medical conditions:  Certain inflammatory bowel diseases, such as Crohn disease and ulcerative colitis.  Diabetes.  Hyperthyroidism.  Hyperparathyroidism. RISK FACTORS Anyone can develop osteoporosis. However, the following factors can increase your risk of developing osteoporosis:  Gender Women are at higher risk than men.  Age  Being older than 66 years increases your risk.  Ethnicity White and Asian people have an increased risk.  Weight Being extremely underweight can increase your risk of osteoporosis.  Family history of osteoporosis Having a family member who has developed osteoporosis can increase your risk. SYMPTOMS  Usually, people with osteoporosis have no symptoms.  DIAGNOSIS  Signs during a physical exam that may prompt your caregiver to suspect osteoporosis include:  Decreased height. This is usually caused by the compression of the bones that form your spine (vertebrae) because they have weakened and become fractured.  A curving or rounding of the upper back (kyphosis). To confirm signs of osteoporosis, your caregiver may request a procedure that uses 2 low-dose X-ray beams with different levels of energy to measure your bone mineral density (dual-energy X-ray absorptiometry [DXA]). Also, your caregiver may check your level of vitamin D. TREATMENT  The goal of osteoporosis treatment is to strengthen bones in order to decrease the risk of bone fractures. There are different types of medicines available to help achieve this goal. Some of these medicines work by slowing the processes of bone loss. Some medicines work by increasing bone density. Treatment also involves making sure that your levels of calcium and vitamin D are adequate. PREVENTION  There are things you can do to help prevent osteoporosis. Adequate intake of calcium and vitamin D can help you achieve optimal bone mineral density. Regular exercise can also help, especially resistance and weight-bearing activities. If you smoke, quitting smoking is an important part of osteoporosis prevention. MAKE SURE YOU:  Understand these instructions.  Will watch your condition.  Will get help  right away if you are not doing well or get worse. FOR MORE INFORMATION www.osteo.org and [employer].ee Document Released: 05/26/2005 Document Revised: 12/11/2012  Document Reviewed: 07/31/2011 [facility] Patient Information 2014 [place], [place].

## 2013-09-17 NOTE — Assessment & Plan Note (Signed)
Reviewed health habits including diet and exercise and skin cancer prevention Reviewed appropriate screening tests for age  Also reviewed health mt list, fam hx and immunization status , as well as social and family history   See HPI Labs drawn today

## 2013-09-17 NOTE — Assessment & Plan Note (Signed)
Lipid panel today Disc goals for lipids and reasons to control them Rev labs with pt from last draw  Rev low sat fat diet in detail  

## 2013-09-17 NOTE — Progress Notes (Signed)
Subjective:    Patient ID: Jenny Mcfarland, female    DOB: Oct 06, 1950, 63 y.o.   MRN: [id number]  HPI Here for health maintenance exam and to review chronic medical problems    Doing pretty well overall  She is loosing weight - 6 lb with bmi of 34  Very stressful past year - husband fell - and it turned into a disaster  Not a lot of time to care for herself - has to do all the driving for him   Flu vaccine 10/14  Declines zoster vaccine   Pap 9/12  Does not see gyn  No hx of cervical cancer    mammo 11/14-- had to have repeat with [place] but that was nl / has fibrocystic breasts  Self exam - no lumps at all   Colonoscopy 4/07- never had polyps - 10 year recall  Hx of barretts- last egd was nl - does not think they needed to repeat egd  Td 9/12  Needs lab today  Osteopenia dexa- 10/14 - TS of -1.9 (down from 1.2) Had ankle 15 years ago  No recent or fragility fx  Ca and D - she takes ca occas (constipating) , D she takes regularly  Is slack with exercise   Hx of hyperlipidemia- due for labs Diet has not been optimal lately  Needs to update controlled subst agreement today  Lorrin Mais - she takes 1/2 one every night   Patient Active Problem List   Diagnosis Date Noted  . Thoracic or lumbosacral neuritis or radiculitis, unspecified 05/21/2013  . Gynecological examination 05/25/2011  . Other screening mammogram 05/25/2011  . Routine general medical examination at a health care facility 05/25/2011  . Osteopenia 12/27/2010  . IBS (irritable bowel syndrome) 12/25/2010  . Allergic rhinitis 12/25/2010  . Asthma 12/25/2010  . Mild hyperlipidemia 12/25/2010  . Family history of breast cancer 12/25/2010  . Family history of aortic aneurysm 12/25/2010  . Diarrhea 11/26/2010  . GERD 11/08/2008  . BARRETT'S ESOPHAGUS 11/08/2008   Past Medical History  Diagnosis Date  . GERD (gastroesophageal reflux disease) 11/2008  . Peptic stricture of esophagus 11-2008  . Asthma   .  Allergic rhinitis   . IBS (irritable bowel syndrome)   . Hemorrhoids   . Hiatal hernia   . Recurrent sinusitis   . Barrett's esophagus 2005  . History of chicken pox   . Osteopenia    Past Surgical History  Procedure Laterality Date  . Tubal ligation    . Cholecystectomy  2010   History  Substance Use Topics  . Smoking status: Never Smoker   . Smokeless tobacco: Never Used  . Alcohol Use: No   Family History  Problem Relation Age of Onset  . Aortic aneurysm Mother   . Heart disease Mother   . Heart attack Brother     early 31's  . Arthritis Brother   . Heart disease Brother   . Colon cancer Neg Hx   . Breast cancer Sister   . Cancer Sister     breast cancer   Allergies  Allergen Reactions  . Levofloxacin Itching    Felt like she had bugs crawling all over her.   Current Outpatient Prescriptions on File Prior to Visit  Medication Sig Dispense Refill  . fexofenadine (ALLEGRA) 180 MG tablet Take 180 mg by mouth daily.        . Fluticasone-Salmeterol (ADVAIR DISKUS) 250-50 MCG/DOSE AEPB Inhale 1 puff into the lungs. One puff once daily       .  lansoprazole (PREVACID SOLUTAB) 30 MG disintegrating tablet Take 1 tablet (30 mg total) by mouth 2 (two) times daily.  60 tablet  5  . zolpidem (AMBIEN) 5 MG tablet Take 0.5-1 tablets (2.5-5 mg total) by mouth at bedtime as needed.  30 tablet  2   No current facility-administered medications on file prior to visit.      Review of Systems Review of Systems  Constitutional: Negative for fever, appetite change, fatigue and unexpected weight change.  Eyes: Negative for pain and visual disturbance.  Respiratory: Negative for cough and shortness of breath.   Cardiovascular: Negative for cp or palpitations    Gastrointestinal: Negative for nausea, diarrhea and constipation.  Genitourinary: Negative for urgency and frequency.  Skin: Negative for pallor or rash   Neurological: Negative for weakness, light-headedness, numbness and  headaches.  Hematological: Negative for adenopathy. Does not bruise/bleed easily.  Psychiatric/Behavioral: Negative for dysphoric mood. The patient is not nervous/anxious.  pos for stressors and fatigue from that        Objective:   Physical Exam  Constitutional: She appears well-developed and well-nourished. No distress.  overwt and well appearing   HENT:  Head: Normocephalic and atraumatic.  Right Ear: External ear normal.  Left Ear: External ear normal.  Mouth/Throat: Oropharynx is clear and moist.  Eyes: Conjunctivae and EOM are normal. Pupils are equal, round, and reactive to light. No scleral icterus.  Neck: Normal range of motion. Neck supple. No JVD present. Carotid bruit is not present. No thyromegaly present.  Cardiovascular: Normal rate, regular rhythm, normal heart sounds and intact distal pulses.  Exam reveals no gallop.   Pulmonary/Chest: Effort normal and breath sounds normal. No respiratory distress. She has no wheezes. She exhibits no tenderness.  Abdominal: Soft. Bowel sounds are normal. She exhibits no distension, no abdominal bruit and no mass. There is no tenderness.  Genitourinary: No breast swelling, tenderness, discharge or bleeding.  Breast exam: No mass, nodules, thickening, tenderness, bulging, retraction, inflamation, nipple discharge or skin changes noted.  No axillary or clavicular LA.   Musculoskeletal: Normal range of motion. She exhibits no edema and no tenderness.  Lymphadenopathy:    She has no cervical adenopathy.  Neurological: She is alert. She has normal reflexes. No cranial nerve deficit. She exhibits normal muscle tone. Coordination normal.  Skin: Skin is warm and dry. No rash noted. No erythema. No pallor.  Some lentigos   Psychiatric: She has a normal mood and affect.          Assessment & Plan:

## 2013-09-18 LAB — VITAMIN D 25 HYDROXY (VIT D DEFICIENCY, FRACTURES): Vit D, 25-Hydroxy: 28 ng/mL — ABNORMAL LOW (ref 30–89)

## 2013-09-20 ENCOUNTER — Encounter: Payer: Self-pay | Admitting: *Deleted

## 2013-09-25 ENCOUNTER — Telehealth: Payer: Self-pay | Admitting: *Deleted

## 2013-09-25 NOTE — Telephone Encounter (Signed)
Received prior auth request from pharmacy for Prevacid solutab. I submitted auth electronically, pending notification from insurance.

## 2013-09-25 NOTE — Telephone Encounter (Signed)
Received notice of approval electronically. Pharmacy notified via fax.

## 2014-01-09 ENCOUNTER — Encounter: Payer: Self-pay | Admitting: Gastroenterology

## 2014-01-09 ENCOUNTER — Ambulatory Visit (INDEPENDENT_AMBULATORY_CARE_PROVIDER_SITE_OTHER): Payer: BC Managed Care – PPO | Admitting: Gastroenterology

## 2014-01-09 VITALS — BP 118/64 | HR 68 | Ht 65.0 in | Wt 173.4 lb

## 2014-01-09 DIAGNOSIS — K2270 Barrett's esophagus without dysplasia: Secondary | ICD-10-CM

## 2014-01-09 DIAGNOSIS — K589 Irritable bowel syndrome without diarrhea: Secondary | ICD-10-CM

## 2014-01-09 MED ORDER — LANSOPRAZOLE 30 MG PO TBDP: 30.0000 mg | ORAL_TABLET | Freq: Two times a day (BID) | ORAL | Status: DC

## 2014-01-09 NOTE — Progress Notes (Signed)
    History of Present Illness: This is a 63 year old female with chronic GERD, Barrett's esophagus and irritable bowel syndrome. Her reflux symptoms are under excellent control on her current medication. She has alternating diarrhea and constipation that has not changed over the course of time. She uses Imodium as needed.  Current Medications, Allergies, Past Medical History, Past Surgical History, Family History and Social History were reviewed in [employer] record.  Physical Exam: General: Well developed , well nourished, no acute distress Head: Normocephalic and atraumatic Eyes:  sclerae anicteric, EOMI Ears: Normal auditory acuity Mouth: No deformity or lesions Lungs: Clear throughout to auscultation Heart: Regular rate and rhythm; no murmurs, rubs or bruits Abdomen: Soft, non tender and non distended. No masses, hepatosplenomegaly or hernias noted. Normal Bowel sounds Musculoskeletal: Symmetrical with no gross deformities  Pulses:  Normal pulses noted Extremities: No clubbing, cyanosis, edema or deformities noted Neurological: Alert oriented x 4, grossly nonfocal Psychological:  Alert and cooperative. Normal mood and affect  Assessment and Recommendations:  1. History of Barrett's esophagus, which was not identified on her endoscopy in May 2013. History of erosive esophagitis. Continue Prevacid 30 mg twice a day and standard antireflux measures. Surveillance EGD at time of her colonoscopy in April 2017.   2. Colorectal cancer screening, average risk. 10 year screening colonoscopy is due April 2017.   3. Diarrhea. IBS and postcholecystectomy. Continue Imodium twice a day when necessary.

## 2014-01-09 NOTE — Patient Instructions (Signed)
We have sent the following medications to your pharmacy for you to pick up at your convenience: Prevacid solutab.  Thank you for choosing me and [facility].  Pricilla Riffle. Dagoberto Ligas., MD., Marval Regal

## 2014-01-18 ENCOUNTER — Other Ambulatory Visit: Payer: Self-pay | Admitting: *Deleted

## 2014-01-18 MED ORDER — ZOLPIDEM TARTRATE 5 MG PO TABS: 2.5000 mg | ORAL_TABLET | Freq: Every evening | ORAL | Status: DC | PRN

## 2014-01-18 NOTE — Telephone Encounter (Signed)
Rx called in as prescribed 

## 2014-01-18 NOTE — Telephone Encounter (Addendum)
#   30 last filled 11/14/2013, last ov was a cpx in 08/2013.

## 2014-01-18 NOTE — Telephone Encounter (Signed)
Px written for call in   

## 2014-05-10 ENCOUNTER — Other Ambulatory Visit: Payer: Self-pay | Admitting: Family Medicine

## 2014-05-10 MED ORDER — ZOLPIDEM TARTRATE 5 MG PO TABS: 2.5000 mg | ORAL_TABLET | Freq: Every evening | ORAL | Status: DC | PRN

## 2014-05-10 NOTE — Telephone Encounter (Signed)
Fax refill request, please advise  

## 2014-05-10 NOTE — Telephone Encounter (Signed)
Rx called in as prescribed 

## 2014-05-10 NOTE — Telephone Encounter (Signed)
Px written for call in   

## 2014-07-01 ENCOUNTER — Ambulatory Visit: Payer: Self-pay | Admitting: Family Medicine

## 2014-07-01 ENCOUNTER — Ambulatory Visit: Payer: BC Managed Care – PPO

## 2014-07-01 ENCOUNTER — Ambulatory Visit (INDEPENDENT_AMBULATORY_CARE_PROVIDER_SITE_OTHER): Payer: BC Managed Care – PPO

## 2014-07-01 DIAGNOSIS — Z23 Encounter for immunization: Secondary | ICD-10-CM

## 2014-07-03 ENCOUNTER — Encounter: Payer: Self-pay | Admitting: Family Medicine

## 2014-07-04 ENCOUNTER — Encounter: Payer: Self-pay | Admitting: *Deleted

## 2014-09-23 ENCOUNTER — Ambulatory Visit (INDEPENDENT_AMBULATORY_CARE_PROVIDER_SITE_OTHER): Payer: 59 | Admitting: Family Medicine

## 2014-09-23 ENCOUNTER — Encounter: Payer: Self-pay | Admitting: Family Medicine

## 2014-09-23 VITALS — BP 120/64 | HR 77 | Temp 97.4°F | Ht 65.0 in | Wt 175.0 lb

## 2014-09-23 DIAGNOSIS — M8580 Other specified disorders of bone density and structure, unspecified site: Secondary | ICD-10-CM

## 2014-09-23 DIAGNOSIS — Z0000 Encounter for general adult medical examination without abnormal findings: Secondary | ICD-10-CM

## 2014-09-23 DIAGNOSIS — E559 Vitamin D deficiency, unspecified: Secondary | ICD-10-CM | POA: Insufficient documentation

## 2014-09-23 LAB — TSH: TSH: 2.68 u[IU]/mL (ref 0.35–4.50)

## 2014-09-23 LAB — CBC WITH DIFFERENTIAL/PLATELET
Basophils Absolute: 0 10*3/uL (ref 0.0–0.1)
Basophils Relative: 0.5 % (ref 0.0–3.0)
EOS PCT: 13.5 % — AB (ref 0.0–5.0)
Eosinophils Absolute: 0.9 10*3/uL — ABNORMAL HIGH (ref 0.0–0.7)
HCT: 37.8 % (ref 36.0–46.0)
Hemoglobin: 12.7 g/dL (ref 12.0–15.0)
LYMPHS PCT: 28 % (ref 12.0–46.0)
Lymphs Abs: 1.8 10*3/uL (ref 0.7–4.0)
MCHC: 33.7 g/dL (ref 30.0–36.0)
MCV: 81.1 fl (ref 78.0–100.0)
MONOS PCT: 6.5 % (ref 3.0–12.0)
Monocytes Absolute: 0.4 10*3/uL (ref 0.1–1.0)
Neutro Abs: 3.2 10*3/uL (ref 1.4–7.7)
Neutrophils Relative %: 51.5 % (ref 43.0–77.0)
Platelets: 254 10*3/uL (ref 150.0–400.0)
RBC: 4.67 Mil/uL (ref 3.87–5.11)
RDW: 14.2 % (ref 11.5–15.5)
WBC: 6.3 10*3/uL (ref 4.0–10.5)

## 2014-09-23 LAB — LIPID PANEL
CHOL/HDL RATIO: 3
Cholesterol: 218 mg/dL — ABNORMAL HIGH (ref 0–200)
HDL: 70.9 mg/dL (ref >39.00)
LDL Cholesterol: 125 mg/dL — ABNORMAL HIGH (ref 0–99)
NonHDL: 147.1
Triglycerides: 113 mg/dL (ref 0.0–149.0)
VLDL: 22.6 mg/dL (ref 0.0–40.0)

## 2014-09-23 LAB — COMPREHENSIVE METABOLIC PANEL
ALT: 15 U/L (ref 0–35)
AST: 14 U/L (ref 0–37)
Albumin: 4.1 g/dL (ref 3.5–5.2)
Alkaline Phosphatase: 83 U/L (ref 39–117)
BILIRUBIN TOTAL: 0.7 mg/dL (ref 0.2–1.2)
BUN: 10 mg/dL (ref 6–23)
CO2: 29 mEq/L (ref 19–32)
Calcium: 9.7 mg/dL (ref 8.4–10.5)
Chloride: 104 mEq/L (ref 96–112)
Creatinine, Ser: 0.72 mg/dL (ref 0.40–1.20)
GFR: 86.98 mL/min (ref >60.00)
GLUCOSE: 97 mg/dL (ref 70–99)
Potassium: 4.3 mEq/L (ref 3.5–5.1)
Sodium: 140 mEq/L (ref 135–145)
Total Protein: 7.2 g/dL (ref 6.0–8.3)

## 2014-09-23 LAB — VITAMIN D 25 HYDROXY (VIT D DEFICIENCY, FRACTURES): VITD: 13.3 ng/mL — AB (ref 30.00–100.00)

## 2014-09-23 NOTE — Assessment & Plan Note (Signed)
Due for dexa next fall -will call for ref  No falls or fx  On PPI Adv to start vit D 1000 iu  D level today Disc need for calcium/ vitamin D/ wt bearing exercise and bone density test every 2 y to monitor Disc safety/ fracture risk in detail

## 2014-09-23 NOTE — Assessment & Plan Note (Signed)
Reviewed health habits including diet and exercise and skin cancer prevention Reviewed appropriate screening tests for age  Also reviewed health mt list, fam hx and immunization status , as well as social and family history    Lab today  Disc imp of vit D intake for bone health Pt declines HIV screen (low risk) Declines gyn exam today-no problems   She will call GI office -re: when due for colonosc/egd

## 2014-09-23 NOTE — Progress Notes (Signed)
Pre visit review using our clinic review tool, if applicable. No additional management support is needed unless otherwise documented below in the visit note. 

## 2014-09-23 NOTE — Progress Notes (Signed)
Subjective:    Patient ID: Jenny Mcfarland, female    DOB: April 05, 1951, 64 y.o.   MRN: [id number]  HPI Here for health maintenance exam and to review chronic medical problems    Has been feeling pretty well in general  Wt is up 2 lb with bmi of 29  She notes her hands go to sleep occasionally -only at night -not too often  When she lies on her R side at night or sits for a long time - will get an ache in R low back and leg - it does get better   hiv screen - not interested   Pap 9/12 nl - declines pap /gyn exam today  Mammogram 11/15 nl  Self exam -no lumps or changes/ her sister has breast cancer   Zoster status -not interested in vaccine   colonosc 4/07-is due for this year (she sees Dr Fuller Plan)- will be due for both that and EGD for barretts at the same time   Td 9/12   Due for labs   Osteopenia  dexa 06/20/13 -will be due in the fall  Femoral neck  No falls or fractures  She takes calcium -on and on  Does not take vit D (she is out in the sun)    Doing well with GERD/barretts on her PPI  Patient Active Problem List   Diagnosis Date Noted  . Thoracic or lumbosacral neuritis or radiculitis, unspecified 05/21/2013  . Gynecological examination 05/25/2011  . Other screening mammogram 05/25/2011  . Routine general medical examination at a health care facility 05/25/2011  . Osteopenia 12/27/2010  . IBS (irritable bowel syndrome) 12/25/2010  . Allergic rhinitis 12/25/2010  . Asthma 12/25/2010  . Mild hyperlipidemia 12/25/2010  . Family history of breast cancer 12/25/2010  . Family history of aortic aneurysm 12/25/2010  . GERD 11/08/2008  . BARRETT'S ESOPHAGUS 11/08/2008   Past Medical History  Diagnosis Date  . GERD (gastroesophageal reflux disease) 11/2008  . Peptic stricture of esophagus 11-2008  . Asthma   . Allergic rhinitis   . IBS (irritable bowel syndrome)   . Hemorrhoids   . Hiatal hernia   . Recurrent sinusitis   . Barrett's esophagus 2005  .  History of chicken pox   . Osteopenia    Past Surgical History  Procedure Laterality Date  . Tubal ligation    . Cholecystectomy  2010   History  Substance Use Topics  . Smoking status: Never Smoker   . Smokeless tobacco: Never Used  . Alcohol Use: No   Family History  Problem Relation Age of Onset  . Aortic aneurysm Mother   . Heart disease Mother   . Heart attack Brother     early 22's  . Arthritis Brother   . Colon cancer Neg Hx   . Breast cancer Sister    Allergies  Allergen Reactions  . Levofloxacin Itching    Felt like she had bugs crawling all over her.   Current Outpatient Prescriptions on File Prior to Visit  Medication Sig Dispense Refill  . fexofenadine (ALLEGRA) 180 MG tablet Take 180 mg by mouth daily.      . Fluticasone-Salmeterol (ADVAIR DISKUS) 250-50 MCG/DOSE AEPB Inhale 1 puff into the lungs 2 (two) times daily. One puff once daily 60 each 11  . lansoprazole (PREVACID SOLUTAB) 30 MG disintegrating tablet Take 1 tablet (30 mg total) by mouth 2 (two) times daily. 60 tablet 11  . zolpidem (AMBIEN) 5 MG tablet Take 0.5-1 tablets (2.5-5  mg total) by mouth at bedtime as needed. 30 tablet 3   No current facility-administered medications on file prior to visit.     Review of Systems Review of Systems  Constitutional: Negative for fever, appetite change, fatigue and unexpected weight change.  Eyes: Negative for pain and visual disturbance.  Respiratory: Negative for cough and shortness of breath.   Cardiovascular: Negative for cp or palpitations    Gastrointestinal: Negative for nausea, diarrhea and constipation.  Genitourinary: Negative for urgency and frequency.  Skin: Negative for pallor or rash   Neurological: Negative for weakness, light-headedness, numbness and headaches.  Hematological: Negative for adenopathy. Does not bruise/bleed easily.  Psychiatric/Behavioral: Negative for dysphoric mood. The patient is not nervous/anxious.         Objective:    Physical Exam  Constitutional: She appears well-developed and well-nourished. No distress.  overwt and well ap  HENT:  Head: Normocephalic and atraumatic.  Right Ear: External ear normal.  Left Ear: External ear normal.  Mouth/Throat: Oropharynx is clear and moist.  Eyes: Conjunctivae and EOM are normal. Pupils are equal, round, and reactive to light. No scleral icterus.  Neck: Normal range of motion. Neck supple. No JVD present. Carotid bruit is not present. No thyromegaly present.  Cardiovascular: Normal rate, regular rhythm, normal heart sounds and intact distal pulses.  Exam reveals no gallop.   Pulmonary/Chest: Effort normal and breath sounds normal. No respiratory distress. She has no wheezes. She exhibits no tenderness.  Abdominal: Soft. Bowel sounds are normal. She exhibits no distension, no abdominal bruit and no mass. There is no tenderness.  Genitourinary: No breast swelling, tenderness, discharge or bleeding.  Breast exam: No mass, nodules, thickening, tenderness, bulging, retraction, inflamation, nipple discharge or skin changes noted.  No axillary or clavicular LA.      Musculoskeletal: Normal range of motion. She exhibits no edema or tenderness.  No kyphosis   Lymphadenopathy:    She has no cervical adenopathy.  Neurological: She is alert. She has normal reflexes. No cranial nerve deficit. She exhibits normal muscle tone. Coordination normal.  Skin: Skin is warm and dry. No rash noted. No erythema. No pallor.  Psychiatric: She has a normal mood and affect.          Assessment & Plan:   Problem List Items Addressed This Visit      Musculoskeletal and Integument   Osteopenia - Primary    Due for dexa next fall -will call for ref  No falls or fx  On PPI Adv to start vit D 1000 iu  D level today Disc need for calcium/ vitamin D/ wt bearing exercise and bone density test every 2 y to monitor Disc safety/ fracture risk in detail        Relevant Orders   Vit D   25 hydroxy (rtn osteoporosis monitoring)     Other   Routine general medical examination at a health care facility    Reviewed health habits including diet and exercise and skin cancer prevention Reviewed appropriate screening tests for age  Also reviewed health mt list, fam hx and immunization status , as well as social and family history    Lab today  Disc imp of vit D intake for bone health Pt declines HIV screen (low risk) Declines gyn exam today-no problems   She will call GI office -re: when due for colonosc/egd       Relevant Orders   CBC with Differential/Platelet   Comprehensive metabolic panel   TSH  Lipid panel

## 2014-09-23 NOTE — Patient Instructions (Signed)
Labs today  Please call your GI office to see when they want to schedule you for colonoscopy and endoscopy  You will be due for bone density testing after 06/21/15 Get an extra 1000 iu of vitamin D daily   Take care

## 2014-09-24 ENCOUNTER — Other Ambulatory Visit: Payer: Self-pay | Admitting: *Deleted

## 2014-09-24 MED ORDER — VITAMIN D (ERGOCALCIFEROL) 1.25 MG (50000 UNIT) PO CAPS: 50000.0000 [IU] | ORAL_CAPSULE | ORAL | Status: DC

## 2014-09-24 NOTE — Telephone Encounter (Signed)
Sent in Rx as directed.

## 2014-10-14 ENCOUNTER — Telehealth: Payer: Self-pay | Admitting: *Deleted

## 2014-10-14 NOTE — Telephone Encounter (Signed)
Patient returned phone call. She stated she forgot to call back. Inform her of lab results. Will sent letter as requested of patient with results.

## 2014-10-22 ENCOUNTER — Telehealth: Payer: Self-pay | Admitting: *Deleted

## 2014-10-22 NOTE — Telephone Encounter (Signed)
Received faxed from [place]. Patient has been approved for prevacid solutab until 10/16/2015. File ID: [id number]

## 2014-11-25 ENCOUNTER — Other Ambulatory Visit: Payer: Self-pay | Admitting: *Deleted

## 2014-11-25 MED ORDER — FLUTICASONE-SALMETEROL 250-50 MCG/DOSE IN AEPB: 1.0000 | INHALATION_SPRAY | Freq: Two times a day (BID) | RESPIRATORY_TRACT | Status: DC

## 2014-11-25 NOTE — Telephone Encounter (Signed)
Received refill request electronically from pharmacy. Refill sent to pharmacy electronically.

## 2014-11-26 ENCOUNTER — Other Ambulatory Visit: Payer: Self-pay

## 2014-11-26 MED ORDER — FLUTICASONE-SALMETEROL 250-50 MCG/DOSE IN AEPB: 1.0000 | INHALATION_SPRAY | Freq: Two times a day (BID) | RESPIRATORY_TRACT | Status: DC | PRN

## 2014-12-16 ENCOUNTER — Other Ambulatory Visit: Payer: Self-pay | Admitting: *Deleted

## 2014-12-16 MED ORDER — ZOLPIDEM TARTRATE 5 MG PO TABS: 2.5000 mg | ORAL_TABLET | Freq: Every evening | ORAL | Status: DC | PRN

## 2014-12-16 NOTE — Telephone Encounter (Signed)
Last filled 05/10/14 x3 refills

## 2014-12-16 NOTE — Telephone Encounter (Signed)
Px written for call in   

## 2014-12-16 NOTE — Telephone Encounter (Signed)
rx called into pharmacy

## 2015-01-13 ENCOUNTER — Ambulatory Visit (INDEPENDENT_AMBULATORY_CARE_PROVIDER_SITE_OTHER): Payer: 59 | Admitting: Gastroenterology

## 2015-01-13 ENCOUNTER — Encounter: Payer: Self-pay | Admitting: Gastroenterology

## 2015-01-13 VITALS — BP 130/70 | HR 60 | Ht 64.75 in | Wt 176.0 lb

## 2015-01-13 DIAGNOSIS — Z1211 Encounter for screening for malignant neoplasm of colon: Secondary | ICD-10-CM

## 2015-01-13 DIAGNOSIS — K589 Irritable bowel syndrome without diarrhea: Secondary | ICD-10-CM

## 2015-01-13 DIAGNOSIS — K2270 Barrett's esophagus without dysplasia: Secondary | ICD-10-CM | POA: Diagnosis not present

## 2015-01-13 MED ORDER — LANSOPRAZOLE 30 MG PO TBDP: 30.0000 mg | ORAL_TABLET | Freq: Two times a day (BID) | ORAL | Status: DC

## 2015-01-13 NOTE — Patient Instructions (Signed)
We have sent medications to your pharmacy for you to pick up at your convenience.  You will receive a recall about your colonoscopy and endoscopy in April 2017.  Thank you for choosing me and [facility].  Pricilla Riffle. Dagoberto Ligas., MD., Marval Regal

## 2015-01-13 NOTE — Progress Notes (Signed)
    History of Present Illness: This is a 64 year old female returning for follow-up of Barrett's esophagus and irritable bowel syndrome. She has short segment Barrett's, history of erosive esophagitis and history of an esophageal stricture. Reflux symptoms are under excellent control. She has mild alternating diarrhea and constipation which is stable.  Current Medications, Allergies, Past Medical History, Past Surgical History, Family History and Social History were reviewed in [employer] record.  Physical Exam: General: Well developed , well nourished, no acute distress Head: Normocephalic and atraumatic Eyes:  sclerae anicteric, EOMI Ears: Normal auditory acuity Mouth: No deformity or lesions Lungs: Clear throughout to auscultation Heart: Regular rate and rhythm; no murmurs, rubs or bruits Abdomen: Soft, non tender and non distended. No masses, hepatosplenomegaly or hernias noted. Normal Bowel sounds Musculoskeletal: Symmetrical with no gross deformities  Pulses:  Normal pulses noted Extremities: No clubbing, cyanosis, edema or deformities noted Neurological: Alert oriented x 4, grossly nonfocal Psychological:  Alert and cooperative. Normal mood and affect  Assessment and Recommendations:  1. IBS, mild, stable. Mild alternating bowel habit pattern.   2. Barrett's esophagus, history of erosive esophagitis and history of a peptic stricture. Continue Prevacid Solutabs 30 mg po bid. continue standard antireflux measures. Change surveillance EGD to 11/2015 at time of colonoscopy.   3. CRC screening, average risk. Colonoscopy at 10 year interval for screening in 11/2015.

## 2015-02-16 ENCOUNTER — Telehealth: Payer: Self-pay | Admitting: Family Medicine

## 2015-02-16 DIAGNOSIS — E559 Vitamin D deficiency, unspecified: Secondary | ICD-10-CM

## 2015-02-16 NOTE — Telephone Encounter (Signed)
-----   Message from Ellamae Sia sent at 02/10/2015  3:53 PM EDT ----- Regarding: Lab orders for Monday,6.20.16 Lab orders, no f/u appt

## 2015-02-17 ENCOUNTER — Other Ambulatory Visit (INDEPENDENT_AMBULATORY_CARE_PROVIDER_SITE_OTHER): Payer: 59

## 2015-02-17 DIAGNOSIS — E559 Vitamin D deficiency, unspecified: Secondary | ICD-10-CM

## 2015-02-17 LAB — VITAMIN D 25 HYDROXY (VIT D DEFICIENCY, FRACTURES): VITD: 34.27 ng/mL (ref 30.00–100.00)

## 2015-02-18 ENCOUNTER — Encounter: Payer: Self-pay | Admitting: *Deleted

## 2015-06-04 ENCOUNTER — Telehealth: Payer: Self-pay | Admitting: Family Medicine

## 2015-06-04 ENCOUNTER — Other Ambulatory Visit: Payer: Self-pay | Admitting: Family Medicine

## 2015-06-04 DIAGNOSIS — Z1231 Encounter for screening mammogram for malignant neoplasm of breast: Secondary | ICD-10-CM

## 2015-06-04 DIAGNOSIS — E2839 Other primary ovarian failure: Secondary | ICD-10-CM

## 2015-06-04 NOTE — Telephone Encounter (Signed)
Opened in error

## 2015-06-04 NOTE — Telephone Encounter (Signed)
Pt notified Dr. Glori Bickers ordered DEXA and pt will call and get it scheduled

## 2015-06-04 NOTE — Telephone Encounter (Signed)
I ordered it

## 2015-06-04 NOTE — Telephone Encounter (Signed)
Pt called wanting to schedule a bone density  Need order  She wants to go to [place] She wants to make her own appointment Please advise when order has been placed

## 2015-06-16 ENCOUNTER — Ambulatory Visit (INDEPENDENT_AMBULATORY_CARE_PROVIDER_SITE_OTHER): Payer: 59

## 2015-06-16 DIAGNOSIS — Z23 Encounter for immunization: Secondary | ICD-10-CM

## 2015-06-17 ENCOUNTER — Other Ambulatory Visit: Payer: Self-pay | Admitting: *Deleted

## 2015-06-17 MED ORDER — ZOLPIDEM TARTRATE 5 MG PO TABS: 2.5000 mg | ORAL_TABLET | Freq: Every evening | ORAL | Status: DC | PRN

## 2015-06-17 NOTE — Telephone Encounter (Signed)
Px written for call in   

## 2015-06-17 NOTE — Telephone Encounter (Signed)
Fax refill request, pt has CPE scheduled on 09/29/15, last refilled on 12/16/14 #30 with 3 additional refills, please advise

## 2015-06-18 NOTE — Telephone Encounter (Signed)
Rx called in as prescribed 

## 2015-07-01 ENCOUNTER — Encounter: Payer: Self-pay | Admitting: Gastroenterology

## 2015-07-03 ENCOUNTER — Ambulatory Visit
Admission: RE | Admit: 2015-07-03 | Discharge: 2015-07-03 | Disposition: A | Discharge status: Home / Self Care | Payer: 59 | Source: Ambulatory Visit | Attending: Family Medicine | Admitting: Family Medicine

## 2015-07-03 DIAGNOSIS — Z1231 Encounter for screening mammogram for malignant neoplasm of breast: Secondary | ICD-10-CM | POA: Diagnosis not present

## 2015-07-03 DIAGNOSIS — E2839 Other primary ovarian failure: Secondary | ICD-10-CM | POA: Insufficient documentation

## 2015-07-03 DIAGNOSIS — Z780 Asymptomatic menopausal state: Secondary | ICD-10-CM | POA: Insufficient documentation

## 2015-07-03 LAB — HM MAMMOGRAPHY: HM MAMMO: NORMAL

## 2015-07-03 LAB — HM DEXA SCAN

## 2015-07-08 ENCOUNTER — Encounter: Payer: Self-pay | Admitting: Family Medicine

## 2015-07-08 ENCOUNTER — Encounter: Payer: Self-pay | Admitting: *Deleted

## 2015-09-11 ENCOUNTER — Telehealth: Payer: Self-pay

## 2015-09-11 MED ORDER — LANSOPRAZOLE 30 MG PO CPDR: 30.0000 mg | DELAYED_RELEASE_CAPSULE | Freq: Every day | ORAL | Status: DC

## 2015-09-11 NOTE — Telephone Encounter (Signed)
Received fax from [facility] of the denial of prevacid solu tab. Called patient to inform her of denial. Told patient that the letter states that she needs to try and fail there alternative medications which includes Nexium packets and etc. Told patient I am not sure what other medications are covered but she can call her insurance company and find out. She states she would like for me to send in Prevacid tablets to see if they are covered and if they are too expensive then she will contact her insurance company and find out what is.

## 2015-09-18 ENCOUNTER — Telehealth: Payer: Self-pay | Admitting: Gastroenterology

## 2015-09-18 MED ORDER — LANSOPRAZOLE 30 MG PO CPDR: 30.0000 mg | DELAYED_RELEASE_CAPSULE | Freq: Two times a day (BID) | ORAL | Status: DC

## 2015-09-18 NOTE — Telephone Encounter (Signed)
Left a message for patient to return my call. 

## 2015-09-18 NOTE — Telephone Encounter (Signed)
Patient states the new Rx for Prevacid said for her to take it once daily at noon. I informed patient to take her prescription as she would normally take it. Also she takes it twice daily sometimes and thinks her RX should have said twice daily . Informed patient I will send a new RX and to double up for now until she runs out of medication. Then pick up the new rx. Pt verbalized understanding.

## 2015-09-29 ENCOUNTER — Encounter: Payer: Self-pay | Admitting: Family Medicine

## 2015-09-29 ENCOUNTER — Ambulatory Visit (INDEPENDENT_AMBULATORY_CARE_PROVIDER_SITE_OTHER): Payer: BLUE CROSS/BLUE SHIELD | Admitting: Family Medicine

## 2015-09-29 VITALS — BP 112/78 | HR 60 | Temp 98.1°F | Ht 64.5 in | Wt 170.5 lb

## 2015-09-29 DIAGNOSIS — E785 Hyperlipidemia, unspecified: Secondary | ICD-10-CM | POA: Diagnosis not present

## 2015-09-29 DIAGNOSIS — Z0000 Encounter for general adult medical examination without abnormal findings: Secondary | ICD-10-CM

## 2015-09-29 DIAGNOSIS — E559 Vitamin D deficiency, unspecified: Secondary | ICD-10-CM | POA: Diagnosis not present

## 2015-09-29 DIAGNOSIS — M8580 Other specified disorders of bone density and structure, unspecified site: Secondary | ICD-10-CM | POA: Diagnosis not present

## 2015-09-29 LAB — COMPREHENSIVE METABOLIC PANEL
ALT: 11 U/L (ref 0–35)
AST: 14 U/L (ref 0–37)
Albumin: 3.9 g/dL (ref 3.5–5.2)
Alkaline Phosphatase: 72 U/L (ref 39–117)
BUN: 9 mg/dL (ref 6–23)
CALCIUM: 9.3 mg/dL (ref 8.4–10.5)
CHLORIDE: 104 meq/L (ref 96–112)
CO2: 30 mEq/L (ref 19–32)
Creatinine, Ser: 0.78 mg/dL (ref 0.40–1.20)
GFR: 79.05 mL/min (ref >60.00)
Glucose, Bld: 96 mg/dL (ref 70–99)
Potassium: 4.1 mEq/L (ref 3.5–5.1)
Sodium: 141 mEq/L (ref 135–145)
Total Bilirubin: 0.7 mg/dL (ref 0.2–1.2)
Total Protein: 7 g/dL (ref 6.0–8.3)

## 2015-09-29 LAB — LIPID PANEL
Cholesterol: 187 mg/dL (ref 0–200)
HDL: 67.6 mg/dL (ref >39.00)
LDL Cholesterol: 106 mg/dL — ABNORMAL HIGH (ref 0–99)
NONHDL: 119.18
Total CHOL/HDL Ratio: 3
Triglycerides: 67 mg/dL (ref 0.0–149.0)
VLDL: 13.4 mg/dL (ref 0.0–40.0)

## 2015-09-29 LAB — CBC WITH DIFFERENTIAL/PLATELET
BASOS ABS: 0 10*3/uL (ref 0.0–0.1)
BASOS PCT: 0.6 % (ref 0.0–3.0)
EOS ABS: 0.8 10*3/uL — AB (ref 0.0–0.7)
Eosinophils Relative: 13 % — ABNORMAL HIGH (ref 0.0–5.0)
HCT: 36.9 % (ref 36.0–46.0)
Hemoglobin: 12.2 g/dL (ref 12.0–15.0)
LYMPHS ABS: 2 10*3/uL (ref 0.7–4.0)
Lymphocytes Relative: 32 % (ref 12.0–46.0)
MCHC: 33.1 g/dL (ref 30.0–36.0)
MCV: 82.6 fl (ref 78.0–100.0)
MONO ABS: 0.3 10*3/uL (ref 0.1–1.0)
Monocytes Relative: 4.9 % (ref 3.0–12.0)
NEUTROS ABS: 3 10*3/uL (ref 1.4–7.7)
NEUTROS PCT: 49.5 % (ref 43.0–77.0)
PLATELETS: 256 10*3/uL (ref 150.0–400.0)
RBC: 4.47 Mil/uL (ref 3.87–5.11)
RDW: 13.7 % (ref 11.5–15.5)
WBC: 6.1 10*3/uL (ref 4.0–10.5)

## 2015-09-29 LAB — VITAMIN D 25 HYDROXY (VIT D DEFICIENCY, FRACTURES): VITD: 25.41 ng/mL — ABNORMAL LOW (ref 30.00–100.00)

## 2015-09-29 LAB — TSH: TSH: 1.69 u[IU]/mL (ref 0.35–4.50)

## 2015-09-29 NOTE — Patient Instructions (Signed)
When you see GI - don't forget to get your colonoscopy scheduled  Take care of yourself  Good job with weight loss and cholesterol control Stay active

## 2015-09-29 NOTE — Progress Notes (Signed)
Pre visit review using our clinic review tool, if applicable. No additional management support is needed unless otherwise documented below in the visit note. 

## 2015-09-29 NOTE — Assessment & Plan Note (Signed)
D level today  Pt has osteopenia but no fractures

## 2015-09-29 NOTE — Progress Notes (Signed)
Subjective:    Patient ID: Jenny Mcfarland, female    DOB: 08/22/1951, 65 y.o.   MRN: [id number]  HPI Here for health maintenance exam and to review chronic medical problems    Is feeling good overall   Some stress- is separated from husband  She actually feels pretty good about that  Wants to be happier   Wt is down 6 lb  Keeps grand kids - moving a lot  bmi of 28  Hep C screen -declines/not high risk  Declines HIV screen  Declines pap  Nl in 2012  Declines zoster vaccine   colonosc 4/07 ok- 10 year recall - will be due April/ she has f/u with Dr Fuller Plan = she will make sure it is scheduled   F/u shot 10/16  Mm 11/16 nl  Self breast exam-no lumps   dexa 11/16 stable osteopenia No falls or broken bones She is taking vit D   (limited ca due to constipation it causes)   Td 9/12  Cholesterol- last year  Lab Results  Component Value Date   CHOL 218* 09/23/2014   CHOL 212* 09/17/2013   CHOL 214* 09/11/2012   Lab Results  Component Value Date   HDL 70.90 09/23/2014   HDL 64.30 09/17/2013   HDL 58.50 09/11/2012   Lab Results  Component Value Date   LDLCALC 125* 09/23/2014   Lab Results  Component Value Date   TRIG 113.0 09/23/2014   TRIG 106.0 09/17/2013   TRIG 100.0 09/11/2012   Lab Results  Component Value Date   CHOLHDL 3 09/23/2014   CHOLHDL 3 09/17/2013   CHOLHDL 4 09/11/2012   Lab Results  Component Value Date   LDLDIRECT 135.4 09/17/2013   LDLDIRECT 125.5 09/11/2012   LDLDIRECT 121.7 05/25/2011    HDL is up and LDL is down  Works on diet -no junk or fried food  Commended  Last chem- last year    Chemistry      Component Value Date/Time   NA 140 09/23/2014 1022   K 4.3 09/23/2014 1022   CL 104 09/23/2014 1022   CO2 29 09/23/2014 1022   BUN 10 09/23/2014 1022   CREATININE 0.72 09/23/2014 1022      Component Value Date/Time   CALCIUM 9.7 09/23/2014 1022   ALKPHOS 83 09/23/2014 1022   AST 14 09/23/2014 1022   ALT 15 09/23/2014  1022   BILITOT 0.7 09/23/2014 1022    Due for labs today   Patient Active Problem List   Diagnosis Date Noted  . Estrogen deficiency 06/04/2015  . Vitamin D deficiency 09/23/2014  . Thoracic or lumbosacral neuritis or radiculitis, unspecified 05/21/2013  . Gynecological examination 05/25/2011  . Other screening mammogram 05/25/2011  . Routine general medical examination at a health care facility 05/25/2011  . Osteopenia 12/27/2010  . IBS (irritable bowel syndrome) 12/25/2010  . Allergic rhinitis 12/25/2010  . Asthma 12/25/2010  . Mild hyperlipidemia 12/25/2010  . Family history of breast cancer 12/25/2010  . Family history of aortic aneurysm 12/25/2010  . GERD 11/08/2008  . BARRETT'S ESOPHAGUS 11/08/2008   Past Medical History  Diagnosis Date  . GERD (gastroesophageal reflux disease) 11/2008  . Peptic stricture of esophagus 11-2008  . Asthma   . Allergic rhinitis   . IBS (irritable bowel syndrome)   . Hemorrhoids   . Hiatal hernia   . Recurrent sinusitis   . Barrett's esophagus 2005  . History of chicken pox   . Osteopenia  Past Surgical History  Procedure Laterality Date  . Tubal ligation    . Cholecystectomy  2010   Social History  Substance Use Topics  . Smoking status: Never Smoker   . Smokeless tobacco: Never Used  . Alcohol Use: No   Family History  Problem Relation Age of Onset  . Aortic aneurysm Mother   . Heart disease Mother   . Heart attack Brother     early 42's  . Arthritis Brother   . Colon cancer Neg Hx   . Breast cancer Sister 10   Allergies  Allergen Reactions  . Levofloxacin Itching    Felt like she had bugs crawling all over her.   Current Outpatient Prescriptions on File Prior to Visit  Medication Sig Dispense Refill  . fexofenadine (ALLEGRA) 180 MG tablet Take 180 mg by mouth daily.      . Fluticasone-Salmeterol (ADVAIR DISKUS) 250-50 MCG/DOSE AEPB Inhale 1 puff into the lungs 2 (two) times daily as needed. 60 each 5  .  lansoprazole (PREVACID) 30 MG capsule Take 1 capsule (30 mg total) by mouth 2 (two) times daily before a meal. 60 capsule 1  . zolpidem (AMBIEN) 5 MG tablet Take 0.5-1 tablets (2.5-5 mg total) by mouth at bedtime as needed. 30 tablet 3   No current facility-administered medications on file prior to visit.    Review of Systems Review of Systems  Constitutional: Negative for fever, appetite change, fatigue and unexpected weight change.  Eyes: Negative for pain and visual disturbance.  Respiratory: Negative for cough and shortness of breath.   Cardiovascular: Negative for cp or palpitations    Gastrointestinal: Negative for nausea, diarrhea and constipation.  Genitourinary: Negative for urgency and frequency.  Skin: Negative for pallor or rash   Neurological: Negative for weakness, light-headedness, numbness and headaches.  Hematological: Negative for adenopathy. Does not bruise/bleed easily.  Psychiatric/Behavioral: Negative for dysphoric mood. The patient is not nervous/anxious.         Objective:   Physical Exam  Constitutional: She appears well-developed and well-nourished. No distress.  obese and well appearing   HENT:  Head: Normocephalic and atraumatic.  Right Ear: External ear normal.  Left Ear: External ear normal.  Mouth/Throat: Oropharynx is clear and moist.  Eyes: Conjunctivae and EOM are normal. Pupils are equal, round, and reactive to light. No scleral icterus.  Neck: Normal range of motion. Neck supple. No JVD present. Carotid bruit is not present. No thyromegaly present.  Cardiovascular: Normal rate, regular rhythm, normal heart sounds and intact distal pulses.  Exam reveals no gallop.   Pulmonary/Chest: Effort normal and breath sounds normal. No respiratory distress. She has no wheezes. She exhibits no tenderness.  Abdominal: Soft. Bowel sounds are normal. She exhibits no distension, no abdominal bruit and no mass. There is no tenderness.  Genitourinary: No breast  swelling, tenderness, discharge or bleeding.  Breast exam: No mass, nodules, thickening, tenderness, bulging, retraction, inflamation, nipple discharge or skin changes noted.  No axillary or clavicular LA.      Musculoskeletal: Normal range of motion. She exhibits no edema or tenderness.  No kyphosis   Lymphadenopathy:    She has no cervical adenopathy.  Neurological: She is alert. She has normal reflexes. No cranial nerve deficit. She exhibits normal muscle tone. Coordination normal.  Skin: Skin is warm and dry. No rash noted. No erythema. No pallor.  Solar lentigo   Psychiatric: She has a normal mood and affect.  Assessment & Plan:   Problem List Items Addressed This Visit      Musculoskeletal and Integument   Osteopenia - Primary    Rev dexa -stable  No falls or fx Disc need for calcium/ vitamin D/ wt bearing exercise and bone density test every 2 y to monitor Disc safety/ fracture risk in detail   D level today        Other   Mild hyperlipidemia    Improved with better diet  Commended  Disc goals for lipids and reasons to control them Rev labs with pt Rev low sat fat diet in detail       Routine general medical examination at a health care facility    Reviewed health habits including diet and exercise and skin cancer prevention Reviewed appropriate screening tests for age  Also reviewed health mt list, fam hx and immunization status , as well as social and family history   See HPI Labs today When you see GI - don't forget to get your colonoscopy scheduled  Take care of yourself  Good job with weight loss and cholesterol control Stay active       Relevant Orders   CBC with Differential/Platelet (Completed)   Comprehensive metabolic panel (Completed)   TSH (Completed)   Lipid panel (Completed)   Vitamin D deficiency    D level today  Pt has osteopenia but no fractures       Relevant Orders   VITAMIN D 25 Hydroxy (Vit-D Deficiency, Fractures)  (Completed)

## 2015-09-29 NOTE — Assessment & Plan Note (Signed)
Reviewed health habits including diet and exercise and skin cancer prevention Reviewed appropriate screening tests for age  Also reviewed health mt list, fam hx and immunization status , as well as social and family history   See HPI Labs today When you see GI - don't forget to get your colonoscopy scheduled  Take care of yourself  Good job with weight loss and cholesterol control Stay active

## 2015-09-29 NOTE — Assessment & Plan Note (Signed)
Improved with better diet  Commended  Disc goals for lipids and reasons to control them Rev labs with pt Rev low sat fat diet in detail

## 2015-09-29 NOTE — Assessment & Plan Note (Signed)
Rev dexa -stable  No falls or fx Disc need for calcium/ vitamin D/ wt bearing exercise and bone density test every 2 y to monitor Disc safety/ fracture risk in detail   D level today

## 2015-09-30 ENCOUNTER — Telehealth: Payer: Self-pay | Admitting: *Deleted

## 2015-09-30 ENCOUNTER — Encounter: Payer: Self-pay | Admitting: *Deleted

## 2015-09-30 MED ORDER — VITAMIN D (ERGOCALCIFEROL) 1.25 MG (50000 UNIT) PO CAPS: 50000.0000 [IU] | ORAL_CAPSULE | ORAL | Status: DC

## 2015-09-30 NOTE — Telephone Encounter (Signed)
Pt notified of lab results and Dr. Marliss Coots comments. Rx sent to pharmacy and pt will call back to schedule lab appt., paper copy of pt's labs mailed to pt per her request

## 2015-09-30 NOTE — Telephone Encounter (Signed)
-----   Message from Abner Greenspan, MD sent at 09/29/2015  9:00 PM EST ----- Labs are ok  Cholesterol is fairly controlled D level is lower - please ask her how much vitamin D she is taking otc (I want her to get at least 2000 iu daily) Please call in ergocalciferol 50,000 iu 1 po Q week #12 no ref Re check D level in 12 weeks for vit D deficiency  Continue otc D while on this

## 2015-10-28 ENCOUNTER — Encounter: Payer: Self-pay | Admitting: Gastroenterology

## 2015-11-03 ENCOUNTER — Encounter: Payer: Self-pay | Admitting: Gastroenterology

## 2015-11-28 ENCOUNTER — Other Ambulatory Visit: Payer: Self-pay | Admitting: Family Medicine

## 2015-11-28 NOTE — Telephone Encounter (Signed)
Do not refill the D Px written for call in  For [place]

## 2015-11-28 NOTE — Telephone Encounter (Signed)
Rx called in as prescribed and pharmacist advised vit D Rx declined since she was done with course

## 2015-11-28 NOTE — Telephone Encounter (Signed)
Pt finished 12 weeks of vit D ? If she should get it refilled, and ambien last refilled on 06/17/15 #30 with 3 additional refills

## 2015-12-09 ENCOUNTER — Telehealth: Payer: Self-pay | Admitting: Gastroenterology

## 2015-12-09 MED ORDER — LANSOPRAZOLE 30 MG PO TBDP: 30.0000 mg | ORAL_TABLET | Freq: Two times a day (BID) | ORAL | Status: DC

## 2015-12-09 NOTE — Telephone Encounter (Signed)
Patient states she received the appeal letter in the mail stating that we can contact [place] and do an appeal over the phone or by letter. I informed patient that I contact [employer] at [phone]. [phone] and try again to get Prevacid solutab approved. Called and spoke with Hinton Dyer who is the cooperate pharmacist and informed her patient has tried and failed Dexilant, pantoprazole, Zantac, omeprazole and Prevacid capsules. Hinton Dyer states since the patient has tried all of these medications, she will approve Prevacid solu-tabs to her plan indefinitely. Patient can also get her approval pre-dated to when is was denied in January. Hinton Dyer states to let patient know to wait 2 hours and go get medication filled at pharmacy. Informed patient and she verbalized understanding.

## 2016-01-05 ENCOUNTER — Other Ambulatory Visit: Payer: Self-pay | Admitting: Family Medicine

## 2016-01-05 DIAGNOSIS — E559 Vitamin D deficiency, unspecified: Secondary | ICD-10-CM

## 2016-01-06 ENCOUNTER — Other Ambulatory Visit (INDEPENDENT_AMBULATORY_CARE_PROVIDER_SITE_OTHER): Payer: BLUE CROSS/BLUE SHIELD

## 2016-01-06 DIAGNOSIS — E559 Vitamin D deficiency, unspecified: Secondary | ICD-10-CM | POA: Diagnosis not present

## 2016-01-06 LAB — VITAMIN D 25 HYDROXY (VIT D DEFICIENCY, FRACTURES): VITD: 40.8 ng/mL (ref 30.00–100.00)

## 2016-01-07 ENCOUNTER — Encounter: Payer: Self-pay | Admitting: *Deleted

## 2016-01-20 ENCOUNTER — Encounter: Payer: Self-pay | Admitting: Gastroenterology

## 2016-01-20 ENCOUNTER — Ambulatory Visit (INDEPENDENT_AMBULATORY_CARE_PROVIDER_SITE_OTHER): Payer: BLUE CROSS/BLUE SHIELD | Admitting: Gastroenterology

## 2016-01-20 VITALS — BP 110/70 | HR 63 | Ht 64.75 in | Wt 170.8 lb

## 2016-01-20 DIAGNOSIS — Z1211 Encounter for screening for malignant neoplasm of colon: Secondary | ICD-10-CM | POA: Diagnosis not present

## 2016-01-20 DIAGNOSIS — K589 Irritable bowel syndrome without diarrhea: Secondary | ICD-10-CM | POA: Diagnosis not present

## 2016-01-20 DIAGNOSIS — K2270 Barrett's esophagus without dysplasia: Secondary | ICD-10-CM | POA: Diagnosis not present

## 2016-01-20 MED ORDER — LANSOPRAZOLE 30 MG PO TBDP: 30.0000 mg | ORAL_TABLET | Freq: Two times a day (BID) | ORAL | Status: DC

## 2016-01-20 MED ORDER — NA SULFATE-K SULFATE-MG SULF 17.5-3.13-1.6 GM/177ML PO SOLN: 1.0000 | Freq: Once | ORAL | Status: DC

## 2016-01-20 NOTE — Progress Notes (Signed)
    History of Present Illness: This is a 65 year old female with GERD, short segment Barrett's esophagus and IBS returning for follow-up. Her reflux symptoms are under excellent control on her current regimen. She has frequent postprandial urgent diarrhea. Symptoms are controlled on Imodium twice a day as needed. Antispasmodics have caused side effects in the past and she would like to avoid them.  Current Medications, Allergies, Past Medical History, Past Surgical History, Family History and Social History were reviewed in [employer] record.  Physical Exam: General: Well developed, well nourished, no acute distress Head: Normocephalic and atraumatic Eyes:  sclerae anicteric, EOMI Ears: Normal auditory acuity Mouth: No deformity or lesions Lungs: Clear throughout to auscultation Heart: Regular rate and rhythm; no murmurs, rubs or bruits Abdomen: Soft, non tender and non distended. No masses, hepatosplenomegaly or hernias noted. Normal Bowel sounds Rectal: Deferred to colonoscopy Musculoskeletal: Symmetrical with no gross deformities  Pulses:  Normal pulses noted Extremities: No clubbing, cyanosis, edema or deformities noted Neurological: Alert oriented x 4, grossly nonfocal Psychological:  Alert and cooperative. Normal mood and affect  Assessment and Recommendations:  1. GERD with short segment Barrett's esophagus. Barrett's was not noted on her last endoscopy in 2013. Continue Prevacid SoluTab 30 mg twice daily and standard antireflux measures. Schedule EGD. The risks (including bleeding, perforation, infection, missed lesions, medication reactions and possible hospitalization or surgery if complications occur), benefits, and alternatives to endoscopy with possible biopsy and possible dilation were discussed with the patient and they consent to proceed.   2. CRC screening, average risk. Schedule colonoscopy. The risks (including bleeding, perforation, infection,  missed lesions, medication reactions and possible hospitalization or surgery if complications occur), benefits, and alternatives to colonoscopy with possible biopsy and possible polypectomy were discussed with the patient and they consent to proceed.   3. IBS-D. Avoid foods that trigger symptoms. Imodium twice a day when necessary.

## 2016-01-20 NOTE — Patient Instructions (Addendum)
You have been scheduled for an endoscopy and colonoscopy. Please follow the written instructions given to you at your visit today. Please pick up your prep supplies at the pharmacy within the next 1-3 days. If you use inhalers (even only as needed), please bring them with you on the day of your procedure. Your physician has requested that you go to www.startemmi.com and enter the access code given to you at your visit today. This web site gives a general overview about your procedure. However, you should still follow specific instructions given to you by our office regarding your preparation for the procedure.  We have sent the following medications to your pharmacy for you to pick up at your convenience:Prevacid solu-tab.  Normal BMI (Body Mass Index- based on height and weight) is between 19 and 25. Your BMI today is Body mass index is 28.63 kg/(m^2). Marland Kitchen Please consider follow up  regarding your BMI with your Primary Care Provider.  Thank you for choosing me and [facility].  Pricilla Riffle. Dagoberto Ligas., MD., Marval Regal

## 2016-02-03 ENCOUNTER — Telehealth: Payer: Self-pay | Admitting: Gastroenterology

## 2016-02-03 NOTE — Telephone Encounter (Signed)
Informed patient that we give her a sample kit of Suprep and we will call her when it gets closer time for her procedure so she can pick it up. Patient verbalized understanding.

## 2016-03-02 ENCOUNTER — Encounter: Payer: Self-pay | Admitting: Gastroenterology

## 2016-03-03 ENCOUNTER — Telehealth: Payer: Self-pay

## 2016-03-03 NOTE — Telephone Encounter (Signed)
Lm that I was leaving Suprep sample up front

## 2016-03-03 NOTE — Telephone Encounter (Signed)
Patient called today about sample Suprep kit. She states she was told it would be ready by the end of June.

## 2016-03-09 ENCOUNTER — Encounter: Payer: BLUE CROSS/BLUE SHIELD | Admitting: Gastroenterology

## 2016-03-12 ENCOUNTER — Ambulatory Visit (AMBULATORY_SURGERY_CENTER): Payer: BLUE CROSS/BLUE SHIELD | Admitting: Gastroenterology

## 2016-03-12 ENCOUNTER — Encounter: Payer: Self-pay | Admitting: Gastroenterology

## 2016-03-12 VITALS — BP 126/51 | HR 67 | Temp 97.8°F | Resp 13 | Ht 64.75 in | Wt 170.0 lb

## 2016-03-12 DIAGNOSIS — D123 Benign neoplasm of transverse colon: Secondary | ICD-10-CM | POA: Diagnosis not present

## 2016-03-12 DIAGNOSIS — Z1211 Encounter for screening for malignant neoplasm of colon: Secondary | ICD-10-CM

## 2016-03-12 DIAGNOSIS — K2210 Ulcer of esophagus without bleeding: Secondary | ICD-10-CM

## 2016-03-12 DIAGNOSIS — K208 Other esophagitis: Secondary | ICD-10-CM

## 2016-03-12 DIAGNOSIS — D124 Benign neoplasm of descending colon: Secondary | ICD-10-CM

## 2016-03-12 DIAGNOSIS — K317 Polyp of stomach and duodenum: Secondary | ICD-10-CM

## 2016-03-12 DIAGNOSIS — K2270 Barrett's esophagus without dysplasia: Secondary | ICD-10-CM

## 2016-03-12 MED ORDER — SODIUM CHLORIDE 0.9 % IV SOLN: 500.0000 mL | INTRAVENOUS | Status: DC

## 2016-03-12 NOTE — Patient Instructions (Addendum)

## 2016-03-12 NOTE — Op Note (Signed)
[place] Patient Name: Jenny Mcfarland Drummonds Procedure Date: 03/12/2016 2:53 PM MRN: [id number] Endoscopist: Ladene Artist , MD Age: 65 Referring MD:  Date of Birth: April 19, 1951 Gender: Female Account #: [id number] Procedure:                Upper GI endoscopy Indications:              Surveillance for malignancy due to personal history                            of Barrett's esophagus Medicines:                Monitored Anesthesia Care Procedure:                Pre-Anesthesia Assessment:                           - Prior to the procedure, a History and Physical                            was performed, and patient medications and                            allergies were reviewed. The patient's tolerance of                            previous anesthesia was also reviewed. The risks                            and benefits of the procedure and the sedation                            options and risks were discussed with the patient.                            All questions were answered, and informed consent                            was obtained. Prior Anticoagulants: The patient has                            taken no previous anticoagulant or antiplatelet                            agents. ASA Grade Assessment: II - A patient with                            mild systemic disease. After reviewing the risks                            and benefits, the patient was deemed in                            satisfactory condition to undergo the procedure.                           -  Prior to the procedure, a History and Physical                            was performed, and patient medications and                            allergies were reviewed. The patient's tolerance of                            previous anesthesia was also reviewed. The risks                            and benefits of the procedure and the sedation                            options and risks were discussed with  the patient.                            All questions were answered, and informed consent                            was obtained. Prior Anticoagulants: The patient has                            taken no previous anticoagulant or antiplatelet                            agents. ASA Grade Assessment: II - A patient with                            mild systemic disease. After reviewing the risks                            and benefits, the patient was deemed in                            satisfactory condition to undergo the procedure.                           After obtaining informed consent, the endoscope was                            passed under direct vision. Throughout the                            procedure, the patient's blood pressure, pulse, and                            oxygen saturations were monitored continuously. The                            Model [id number]) scope was introduced  through the mouth, and advanced to the second part                            of duodenum. The upper GI endoscopy was                            accomplished without difficulty. The patient                            tolerated the procedure well. Scope In: Scope Out: Findings:                 LA Grade B (one or more mucosal breaks continuous                            between tops of 2 or more mucosal folds, less than                            75% circumference) esophagitis with no bleeding was                            found in the distal esophagus. Biopsies were taken                            with a cold forceps for histology. No clear                            Barrett's was noted.                           The exam of the esophagus was otherwise normal.                           Multiple 3 to 6 mm sessile polyps with no bleeding                            and no stigmata of recent bleeding were found in                            the gastric fundus  and in the gastric body.                            Biopsies were taken with a cold forceps from                            several of the larger polyps for histology.                           A medium-sized hiatal hernia was present.                           The exam of the stomach was otherwise normal.  The duodenal bulb and second portion of the                            duodenum were normal. Complications:            No immediate complications. Estimated Blood Loss:     Estimated blood loss was minimal. Impression:               - LA Grade B chronic esophagitis. Rule out                            Barrett's esophagus. Biopsied.                           - Multiple gastric polyps. Biopsied.                           - Medium-sized hiatal hernia.                           - Normal duodenal bulb and second portion of the                            duodenum. Recommendation:           - Patient has a contact number available for                            emergencies. The signs and symptoms of potential                            delayed complications were discussed with the                            patient. Return to normal activities tomorrow.                            Written discharge instructions were provided to the                            patient.                           - Resume previous diet.                           - Continue present medications including a PPI bid                            long term.                           - Await pathology results.                           - Repeat upper endoscopy for surveillance based on  pathology results. Ladene Artist, MD 03/12/2016 3:11:46 PM This report has been signed electronically.

## 2016-03-12 NOTE — Op Note (Signed)
[place] Patient Name: Jenny Mcfarland Procedure Date: 03/12/2016 2:29 PM MRN: [id number] Endoscopist: Ladene Artist , MD Age: 65 Referring MD:  Date of Birth: [birth year] Gender: Female Account #: [id number] Procedure:                Colonoscopy Indications:              Screening for colorectal malignant neoplasm Medicines:                Monitored Anesthesia Care Procedure:                Pre-Anesthesia Assessment:                           - Prior to the procedure, a History and Physical                            was performed, and patient medications and                            allergies were reviewed. The patient's tolerance of                            previous anesthesia was also reviewed. The risks                            and benefits of the procedure and the sedation                            options and risks were discussed with the patient.                            All questions were answered, and informed consent                            was obtained. Prior Anticoagulants: The patient has                            taken no previous anticoagulant or antiplatelet                            agents. ASA Grade Assessment: II - A patient with                            mild systemic disease. After reviewing the risks                            and benefits, the patient was deemed in                            satisfactory condition to undergo the procedure.                           After obtaining informed consent, the colonoscope  was passed under direct vision. Throughout the                            procedure, the patient's blood pressure, pulse, and                            oxygen saturations were monitored continuously. The                            Model [id number]) scope was introduced                            through the anus and advanced to the the cecum,                            identified by  appendiceal orifice and ileocecal                            valve. The ileocecal valve, appendiceal orifice,                            and rectum were photographed. The quality of the                            bowel preparation was excellent. The colonoscopy                            was performed without difficulty. The patient                            tolerated the procedure well. Scope In: 2:36:26 PM Scope Out: 2:51:42 PM Scope Withdrawal Time: 0 hours 13 minutes 13 seconds  Total Procedure Duration: 0 hours 15 minutes 16 seconds  Findings:                 Two sessile polyps were found in the descending                            colon and transverse colon. The polyps were 6 to 7                            mm in size. These polyps were removed with a cold                            snare. Resection and retrieval were complete.                           Internal hemorrhoids were found during                            retroflexion. The hemorrhoids were small and Grade                            I (internal hemorrhoids that do not  prolapse).                           The exam was otherwise without abnormality on                            direct and retroflexion views.                           A single small angiodysplastic lesion without                            bleeding was found in the ascending colon. Complications:            No immediate complications. Estimated blood loss:                            None. Estimated Blood Loss:     Estimated blood loss: none. Impression:               - Two 6 to 7 mm polyps in the descending colon and                            in the transverse colon, removed with a cold snare.                            Resected and retrieved.                           - Internal hemorrhoids.                           - One angiodysplastic lesion in the ascending colon Recommendation:           - Repeat colonoscopy in 5 years for surveillance if                             polyp(s) precancerous, otherwise 10 years.                           - Patient has a contact number available for                            emergencies. The signs and symptoms of potential                            delayed complications were discussed with the                            patient. Return to normal activities tomorrow.                            Written discharge instructions were provided to the                            patient.                           -  Resume previous diet.                           - Continue present medications.                           - Await pathology results. Ladene Artist, MD 03/12/2016 3:06:10 PM This report has been signed electronically.

## 2016-03-12 NOTE — Progress Notes (Signed)
To recovery awake alert vss report to PAm rn

## 2016-03-12 NOTE — Progress Notes (Signed)
Called to room to assist during endoscopic procedure.  Patient ID and intended procedure confirmed with present staff. Received instructions for my participation in the procedure from the performing physician.  

## 2016-03-15 ENCOUNTER — Telehealth: Payer: Self-pay | Admitting: *Deleted

## 2016-03-15 NOTE — Telephone Encounter (Signed)
  Follow up Call-  Call back number 03/12/2016  Post procedure Call Back phone  # [phone]  Permission to leave phone message Yes     No answer, left message.

## 2016-03-22 ENCOUNTER — Encounter: Payer: Self-pay | Admitting: Gastroenterology

## 2016-06-01 ENCOUNTER — Other Ambulatory Visit: Payer: Self-pay | Admitting: *Deleted

## 2016-06-01 MED ORDER — ZOLPIDEM TARTRATE 5 MG PO TABS: ORAL_TABLET | ORAL | 3 refills | Status: DC

## 2016-06-01 NOTE — Telephone Encounter (Signed)
Last filled on 11/28/15 #30 with 3 additional refills, CPE scheduled on 10/02/15

## 2016-06-01 NOTE — Telephone Encounter (Signed)
Rx called in as prescribed 

## 2016-06-01 NOTE — Telephone Encounter (Signed)
Px written for call in   

## 2016-06-11 ENCOUNTER — Other Ambulatory Visit: Payer: Self-pay | Admitting: Family Medicine

## 2016-06-11 DIAGNOSIS — Z1231 Encounter for screening mammogram for malignant neoplasm of breast: Secondary | ICD-10-CM

## 2016-06-18 ENCOUNTER — Ambulatory Visit: Payer: BLUE CROSS/BLUE SHIELD

## 2016-06-25 ENCOUNTER — Ambulatory Visit (INDEPENDENT_AMBULATORY_CARE_PROVIDER_SITE_OTHER): Payer: BLUE CROSS/BLUE SHIELD

## 2016-06-25 DIAGNOSIS — Z23 Encounter for immunization: Secondary | ICD-10-CM | POA: Diagnosis not present

## 2016-07-12 ENCOUNTER — Ambulatory Visit
Admission: RE | Admit: 2016-07-12 | Discharge: 2016-07-12 | Disposition: A | Discharge status: Home / Self Care | Payer: BLUE CROSS/BLUE SHIELD | Source: Ambulatory Visit | Attending: Family Medicine | Admitting: Family Medicine

## 2016-07-12 DIAGNOSIS — Z1231 Encounter for screening mammogram for malignant neoplasm of breast: Secondary | ICD-10-CM | POA: Insufficient documentation

## 2016-07-12 LAB — HM MAMMOGRAPHY

## 2016-07-14 ENCOUNTER — Encounter: Payer: Self-pay | Admitting: *Deleted

## 2016-10-01 ENCOUNTER — Encounter: Payer: BLUE CROSS/BLUE SHIELD | Admitting: Family Medicine

## 2016-10-05 ENCOUNTER — Telehealth: Payer: Self-pay | Admitting: Gastroenterology

## 2016-10-05 MED ORDER — LANSOPRAZOLE 30 MG PO TBDP: 30.0000 mg | ORAL_TABLET | Freq: Two times a day (BID) | ORAL | 4 refills | Status: DC

## 2016-10-05 NOTE — Telephone Encounter (Signed)
Prescription sent patient's pharmacy and patient notified. Pt verbalized understanding.

## 2016-11-01 ENCOUNTER — Other Ambulatory Visit: Payer: Self-pay | Admitting: *Deleted

## 2016-11-01 MED ORDER — ZOLPIDEM TARTRATE 5 MG PO TABS: ORAL_TABLET | ORAL | 3 refills | Status: DC

## 2016-11-01 NOTE — Telephone Encounter (Signed)
Px written for call in   

## 2016-11-01 NOTE — Telephone Encounter (Signed)
Rx called in as prescribed 

## 2016-11-01 NOTE — Telephone Encounter (Signed)
Ok to refill? Last filled 06/01/16 #30 [place]

## 2016-11-03 ENCOUNTER — Telehealth: Payer: Self-pay | Admitting: Gastroenterology

## 2016-11-03 NOTE — Telephone Encounter (Signed)
Informed patient that I will contact her insurance company but would like her ID #. Patient states her ID # is [id number]. Called [employer] to initiate PA for lanosprazole twice daily. [employer] approved medication and quantity limit. The approval dates for medication approval are 08/28/16-08/28/17 and quantity limit approval is 08/30/16-08/29/17.   Called patient and informed her of approval of lansoprazole PA and left a message for her to call me back.

## 2016-11-04 NOTE — Telephone Encounter (Signed)
Spoke with patient and informed her that medication PA was approved. Patient verbalized understanding.

## 2016-11-18 ENCOUNTER — Telehealth: Payer: Self-pay

## 2016-11-18 NOTE — Telephone Encounter (Signed)
Form completed on CoverMyMeds

## 2016-11-22 NOTE — Telephone Encounter (Signed)
PA was approved, pharmacy notified and copy of form sent to scanning

## 2016-11-23 NOTE — Telephone Encounter (Signed)
Pt left v/m requesting status of zolpidem PA.[email or web address] pt PA was approved and pt will ck with pharmacy.

## 2016-12-21 ENCOUNTER — Ambulatory Visit (INDEPENDENT_AMBULATORY_CARE_PROVIDER_SITE_OTHER): Payer: Medicare HMO | Admitting: Family Medicine

## 2016-12-21 ENCOUNTER — Encounter: Payer: Self-pay | Admitting: Family Medicine

## 2016-12-21 VITALS — BP 132/68 | HR 55 | Temp 98.2°F | Ht 64.5 in | Wt 167.0 lb

## 2016-12-21 DIAGNOSIS — E785 Hyperlipidemia, unspecified: Secondary | ICD-10-CM

## 2016-12-21 DIAGNOSIS — Z23 Encounter for immunization: Secondary | ICD-10-CM

## 2016-12-21 DIAGNOSIS — M8580 Other specified disorders of bone density and structure, unspecified site: Secondary | ICD-10-CM

## 2016-12-21 DIAGNOSIS — J4520 Mild intermittent asthma, uncomplicated: Secondary | ICD-10-CM

## 2016-12-21 DIAGNOSIS — E559 Vitamin D deficiency, unspecified: Secondary | ICD-10-CM | POA: Diagnosis not present

## 2016-12-21 DIAGNOSIS — Z0000 Encounter for general adult medical examination without abnormal findings: Secondary | ICD-10-CM | POA: Insufficient documentation

## 2016-12-21 LAB — COMPREHENSIVE METABOLIC PANEL
ALBUMIN: 4.2 g/dL (ref 3.5–5.2)
ALK PHOS: 78 U/L (ref 39–117)
ALT: 12 U/L (ref 0–35)
AST: 13 U/L (ref 0–37)
BUN: 6 mg/dL (ref 6–23)
CO2: 31 mEq/L (ref 19–32)
CREATININE: 0.78 mg/dL (ref 0.40–1.20)
Calcium: 9.9 mg/dL (ref 8.4–10.5)
Chloride: 104 mEq/L (ref 96–112)
GFR: 78.74 mL/min (ref >60.00)
Glucose, Bld: 99 mg/dL (ref 70–99)
Potassium: 4.5 mEq/L (ref 3.5–5.1)
SODIUM: 140 meq/L (ref 135–145)
TOTAL PROTEIN: 7.2 g/dL (ref 6.0–8.3)
Total Bilirubin: 0.8 mg/dL (ref 0.2–1.2)

## 2016-12-21 LAB — CBC WITH DIFFERENTIAL/PLATELET
BASOS ABS: 0 10*3/uL (ref 0.0–0.1)
BASOS PCT: 0.7 % (ref 0.0–3.0)
EOS PCT: 14.6 % — AB (ref 0.0–5.0)
Eosinophils Absolute: 0.9 10*3/uL — ABNORMAL HIGH (ref 0.0–0.7)
HCT: 39.2 % (ref 36.0–46.0)
Hemoglobin: 12.9 g/dL (ref 12.0–15.0)
LYMPHS PCT: 26.4 % (ref 12.0–46.0)
Lymphs Abs: 1.7 10*3/uL (ref 0.7–4.0)
MCHC: 32.9 g/dL (ref 30.0–36.0)
MCV: 83 fl (ref 78.0–100.0)
MONOS PCT: 5.6 % (ref 3.0–12.0)
Monocytes Absolute: 0.4 10*3/uL (ref 0.1–1.0)
Neutro Abs: 3.4 10*3/uL (ref 1.4–7.7)
Neutrophils Relative %: 52.7 % (ref 43.0–77.0)
Platelets: 266 10*3/uL (ref 150.0–400.0)
RBC: 4.73 Mil/uL (ref 3.87–5.11)
RDW: 13.9 % (ref 11.5–15.5)
WBC: 6.5 10*3/uL (ref 4.0–10.5)

## 2016-12-21 LAB — LIPID PANEL
CHOLESTEROL: 205 mg/dL — AB (ref 0–200)
HDL: 77 mg/dL (ref >39.00)
LDL Cholesterol: 108 mg/dL — ABNORMAL HIGH (ref 0–99)
NonHDL: 128.49
Total CHOL/HDL Ratio: 3
Triglycerides: 101 mg/dL (ref 0.0–149.0)
VLDL: 20.2 mg/dL (ref 0.0–40.0)

## 2016-12-21 LAB — VITAMIN D 25 HYDROXY (VIT D DEFICIENCY, FRACTURES): VITD: 31.01 ng/mL (ref 30.00–100.00)

## 2016-12-21 LAB — TSH: TSH: 2.21 u[IU]/mL (ref 0.35–4.50)

## 2016-12-21 MED ORDER — FLUTICASONE-SALMETEROL 250-50 MCG/DOSE IN AEPB: 1.0000 | INHALATION_SPRAY | Freq: Two times a day (BID) | RESPIRATORY_TRACT | 11 refills | Status: DC | PRN

## 2016-12-21 MED ORDER — PNEUMOCOCCAL 13-VAL CONJ VACC IM SUSP
0.5000 mL | Freq: Once | INTRAMUSCULAR | Status: AC
  Administered 2016-12-21: 0.5 mL via INTRAMUSCULAR

## 2016-12-21 NOTE — Progress Notes (Signed)
Pre visit review using our clinic review tool, if applicable. No additional management support is needed unless otherwise documented below in the visit note. 

## 2016-12-21 NOTE — Progress Notes (Signed)
Subjective:    Patient ID: Jenny Mcfarland, female    DOB: [birth year], 66 y.o.   MRN: [id number]  HPI  Here for welcome to medicare visit     Wt Readings from Last 3 Encounters:  12/21/16 167 lb (75.8 kg)  03/12/16 170 lb (77.1 kg)  01/20/16 170 lb 12.8 oz (77.5 kg)    bmi 28.22   I have personally reviewed the Medicare Annual Wellness questionnaire and have noted 1. The patient's medical and social history 2. Their use of alcohol, tobacco or illicit drugs 3. Their current medications and supplements 4. The patient's functional ability including ADL's, fall risks, home safety risks and hearing or visual             impairment. 5. Diet and physical activities 6. Evidence for depression or mood disorders  The patients weight, height, BMI have been recorded in the chart and visual acuity is per eye clinic.  I have made referrals, counseling and provided education to the patient based review of the above and I have provided the pt with a written personalized care plan for preventive services. Reviewed and updated provider list, see scanned forms.  See scanned forms.  Routine anticipatory guidance given to patient.  See health maintenance. Colon cancer screening 7/17- 5 year recall  Breast cancer screening 11/17-neg  Self breast exam-no lumps  Breast cancer in a sister / no other breast cancer  Flu vaccine 10/17 Tetanus vaccine 9/12 Pneumovax - prevnar today  Zoster vaccine- considering  dexa-- stable 11/16 , no falls or fractures  Taking her D regularly , not always calcium-it constipates it  Hx of vit D def- taking 2000 iu weekly   Advance directive-has living will and poa / will bring it by and scan it in  Cognitive function addressed- see scanned forms- and if abnormal then additional documentation follows. -doing well  Does not misplace things/ sometimes walks into a room and forgets why Does her finances  Does homework with grand kids   Hx of  hyperlipidemia Lab Results  Component Value Date   CHOL 187 09/29/2015   CHOL 218 (H) 09/23/2014   CHOL 212 (H) 09/17/2013   Lab Results  Component Value Date   HDL 67.60 09/29/2015   HDL 70.90 09/23/2014   HDL 64.30 09/17/2013   Lab Results  Component Value Date   LDLCALC 106 (H) 09/29/2015   LDLCALC 125 (H) 09/23/2014   Lab Results  Component Value Date   TRIG 67.0 09/29/2015   TRIG 113.0 09/23/2014   TRIG 106.0 09/17/2013   Lab Results  Component Value Date   CHOLHDL 3 09/29/2015   CHOLHDL 3 09/23/2014   CHOLHDL 3 09/17/2013   Lab Results  Component Value Date   LDLDIRECT 135.4 09/17/2013   LDLDIRECT 125.5 09/11/2012   LDLDIRECT 121.7 05/25/2011    PMH and SH reviewed  Meds, vitals, and allergies reviewed.   ROS: See HPI.  Otherwise negative.    GERD- still takes prevacid and it works well from Dr Fuller Plan (hx of barretts esoph) Some allegra for allegra for allergies , she uses steroid ns on and off   Also advair - and it works very very well/ seldom bothered by asthma   Lorrin Mais works well - she takes 1/2 usually  Some funky dreams occasionally     Hearing Screening   125Hz  250Hz  500Hz  1000Hz  2000Hz  3000Hz  4000Hz  6000Hz  8000Hz   Right ear:   40 0 40  40    Left  ear:   40 0 40  0    Vision Screening Comments: Pt had an eye exam in Nov 2017 at [facility]  She has trouble hearing a whisper or a monotone voice / no problems in crowded rooms  Not bothered by that   EKG done today No change from baseline with short PR and some ST changes - rev with her EKG from 4/11 EKG Patient Active Problem List   Diagnosis Date Noted  . Welcome to Medicare preventive visit 12/21/2016  . Estrogen deficiency 06/04/2015  . Vitamin D deficiency 09/23/2014  . Thoracic or lumbosacral neuritis or radiculitis, unspecified 05/21/2013  . Gynecological examination 05/25/2011  . Other screening mammogram 05/25/2011  . Routine general medical examination at a health  care facility 05/25/2011  . Osteopenia 12/27/2010  . IBS (irritable bowel syndrome) 12/25/2010  . Allergic rhinitis 12/25/2010  . Asthma 12/25/2010  . Mild hyperlipidemia 12/25/2010  . Family history of breast cancer 12/25/2010  . Family history of aortic aneurysm 12/25/2010  . GERD 11/08/2008  . BARRETT'S ESOPHAGUS 11/08/2008   Past Medical History:  Diagnosis Date  . Allergic rhinitis   . Asthma   . Barrett's esophagus 2005  . GERD (gastroesophageal reflux disease) 11/2008  . Hemorrhoids   . Hiatal hernia   . History of chicken pox   . IBS (irritable bowel syndrome)   . Osteopenia   . Peptic stricture of esophagus 11-2008  . Recurrent sinusitis    Past Surgical History:  Procedure Laterality Date  . CHOLECYSTECTOMY  2010  . TUBAL LIGATION     Social History  Substance Use Topics  . Smoking status: Never Smoker  . Smokeless tobacco: Never Used  . Alcohol use No   Family History  Problem Relation Age of Onset  . Aortic aneurysm Mother   . Heart disease Mother   . Heart attack Brother     early 41's  . Arthritis Brother   . Breast cancer Sister 81  . Colon cancer Neg Hx    Allergies  Allergen Reactions  . Levofloxacin Itching    Felt like she had bugs crawling all over her.   Current Outpatient Prescriptions on File Prior to Visit  Medication Sig Dispense Refill  . fexofenadine (ALLEGRA) 180 MG tablet Take 180 mg by mouth daily.      . lansoprazole (PREVACID SOLUTAB) 30 MG disintegrating tablet Take 1 tablet (30 mg total) by mouth 2 (two) times daily. 60 tablet 4  . zolpidem (AMBIEN) 5 MG tablet TAKE 1/2 TO 1 TABLET BY MOUTH AT BEDTIMEAS NEEDED 30 tablet 3   No current facility-administered medications on file prior to visit.      Review of Systems Review of Systems  Constitutional: Negative for fever, appetite change, fatigue and unexpected weight change.  Eyes: Negative for pain and visual disturbance.  Respiratory: Negative for cough and shortness of  breath.   Cardiovascular: Negative for cp or palpitations    Gastrointestinal: Negative for nausea, diarrhea and constipation.  Genitourinary: Negative for urgency and frequency.  Skin: Negative for pallor or rash   Neurological: Negative for weakness, light-headedness, numbness and headaches.  Hematological: Negative for adenopathy. Does not bruise/bleed easily.  Psychiatric/Behavioral: Negative for dysphoric mood. The patient is not nervous/anxious.         Objective:   Physical Exam  Constitutional: She appears well-developed and well-nourished. No distress.  overwt and well appearing   HENT:  Head: Normocephalic and atraumatic.  Right Ear: External ear  normal.  Left Ear: External ear normal.  Mouth/Throat: Oropharynx is clear and moist.  Eyes: Conjunctivae and EOM are normal. Pupils are equal, round, and reactive to light. No scleral icterus.  Neck: Normal range of motion. Neck supple. No JVD present. Carotid bruit is not present. No thyromegaly present.  Cardiovascular: Normal rate, regular rhythm, normal heart sounds and intact distal pulses.  Exam reveals no gallop.   Pulmonary/Chest: Effort normal and breath sounds normal. No respiratory distress. She has no wheezes. She exhibits no tenderness.  Abdominal: Soft. Bowel sounds are normal. She exhibits no distension, no abdominal bruit and no mass. There is no tenderness.  Genitourinary: No breast swelling, tenderness, discharge or bleeding.  Genitourinary Comments: Breast exam: No mass, nodules, thickening, tenderness, bulging, retraction, inflamation, nipple discharge or skin changes noted.  No axillary or clavicular LA.      Musculoskeletal: Normal range of motion. She exhibits no edema or tenderness.  Lymphadenopathy:    She has no cervical adenopathy.  Neurological: She is alert. She has normal reflexes. No cranial nerve deficit. She exhibits normal muscle tone. Coordination normal.  Skin: Skin is warm and dry. No rash noted.  No erythema. No pallor.  Lentigines diffusely  Some skin tags on neck  Psychiatric: She has a normal mood and affect.          Assessment & Plan:   Problem List Items Addressed This Visit      Respiratory   Asthma    Doing well with advair which decreases symptoms significantly       Relevant Medications   Fluticasone-Salmeterol (ADVAIR DISKUS) 250-50 MCG/DOSE AEPB     Musculoskeletal and Integument   Osteopenia    Stable dexa 11/16 w/o falls or fractures Takes vit D Can re check at 3 y  Disc fall prev         Other   Mild hyperlipidemia    Lab ordered Disc goals for lipids and reasons to control them Rev labs with pt (last check) Rev low sat fat diet in detail       Relevant Orders   Lipid panel (Completed)   Vitamin D deficiency    Takes 2000 iu daily  Lab ordered       Relevant Orders   VITAMIN D 25 Hydroxy (Vit-D Deficiency, Fractures) (Completed)   Welcome to Medicare preventive visit - Primary    Reviewed health habits including diet and exercise and skin cancer prevention Reviewed appropriate screening tests for age  Also reviewed health mt list, fam hx and immunization status , as well as social and family history   See HPI Labs ordered She will bring in adv directive to scan  No memory issues  Rev hearing exam -she is not bothered by hearing  Considering Shingrix vaccine if affordable        Relevant Orders   EKG 12-Lead (Completed)   CBC with Differential/Platelet (Completed)   Comprehensive metabolic panel (Completed)   Lipid panel (Completed)   TSH (Completed)    Other Visit Diagnoses    Need for vaccination with 13-polyvalent pneumococcal conjugate vaccine       Relevant Medications   pneumococcal 13-valent conjugate vaccine (PREVNAR 13) injection 0.5 mL (Completed)

## 2016-12-21 NOTE — Patient Instructions (Addendum)
In you are interested in a shingles vaccine (Shingrix)   If you are interested in a shingles/zoster vaccine - call your insurance to check on coverage,( you should not get it within 1 month of other vaccines) , then call us for a prescription  for it to take to a pharmacy that gives the shot , or make a nurse visit to get it here depending on your coverage   Make sure you are taking at least 2000 iu of vitamin D daily   Keep working on diet and exercise   prevnar vaccine today   Labs today

## 2016-12-22 ENCOUNTER — Encounter: Payer: Self-pay | Admitting: *Deleted

## 2016-12-22 NOTE — Assessment & Plan Note (Addendum)
Reviewed health habits including diet and exercise and skin cancer prevention Reviewed appropriate screening tests for age  Also reviewed health mt list, fam hx and immunization status , as well as social and family history   See HPI Labs ordered She will bring in adv directive to scan  No memory issues  Rev hearing exam -she is not bothered by hearing  Considering Shingrix vaccine if affordable

## 2016-12-22 NOTE — Assessment & Plan Note (Signed)
Takes 2000 iu daily  Lab ordered

## 2016-12-22 NOTE — Assessment & Plan Note (Signed)
Stable dexa 11/16 w/o falls or fractures Takes vit D Can re check at 3 y  Disc fall prev

## 2016-12-22 NOTE — Assessment & Plan Note (Signed)
Lab ordered Disc goals for lipids and reasons to control them Rev labs with pt (last check) Rev low sat fat diet in detail

## 2016-12-22 NOTE — Assessment & Plan Note (Signed)
Doing well with advair which decreases symptoms significantly

## 2017-01-20 ENCOUNTER — Encounter: Payer: Self-pay | Admitting: Gastroenterology

## 2017-01-20 ENCOUNTER — Ambulatory Visit (INDEPENDENT_AMBULATORY_CARE_PROVIDER_SITE_OTHER): Payer: Medicare HMO | Admitting: Gastroenterology

## 2017-01-20 ENCOUNTER — Encounter (INDEPENDENT_AMBULATORY_CARE_PROVIDER_SITE_OTHER): Payer: Self-pay

## 2017-01-20 VITALS — BP 126/60 | HR 60 | Ht 64.0 in | Wt 169.6 lb

## 2017-01-20 DIAGNOSIS — K2270 Barrett's esophagus without dysplasia: Secondary | ICD-10-CM

## 2017-01-20 DIAGNOSIS — K2100 Gastro-esophageal reflux disease with esophagitis, without bleeding: Secondary | ICD-10-CM

## 2017-01-20 DIAGNOSIS — Z86010 Personal history of colonic polyps: Secondary | ICD-10-CM | POA: Diagnosis not present

## 2017-01-20 MED ORDER — LANSOPRAZOLE 30 MG PO TBDP
30.0000 mg | ORAL_TABLET | Freq: Two times a day (BID) | ORAL | 11 refills | Status: DC
Start: 2017-01-20 — End: 2018-02-01

## 2017-01-20 NOTE — Patient Instructions (Signed)
We have sent the following medications to your pharmacy for you to pick up at your convenience: Prevacid.   Thank you for choosing me and [facility].  Malcolm T. Stark, Jr., MD., FACG   

## 2017-01-20 NOTE — Progress Notes (Signed)
    History of Present Illness: This is a 66 year old female with a history of erosive esophagitis, Barrett's esophagus and IBS. EGD and colonoscopy were performed in July 2017. Barrett's was not noted on her last EGD. She has occasional GERD symptoms however she feels she is doing quite well. IBS is only occasionally active.   Current Medications, Allergies, Past Medical History, Past Surgical History, Family History and Social History were reviewed in [employer] record.  Physical Exam: General: Well developed, well nourished, no acute distress Head: Normocephalic and atraumatic Eyes:  sclerae anicteric, EOMI Ears: Normal auditory acuity Mouth: No deformity or lesions Lungs: Clear throughout to auscultation Heart: Regular rate and rhythm; no murmurs, rubs or bruits Abdomen: Soft, non tender and non distended. No masses, hepatosplenomegaly or hernias noted. Normal Bowel sounds Musculoskeletal: Symmetrical with no gross deformities  Pulses:  Normal pulses noted Extremities: No clubbing, cyanosis, edema or deformities noted Neurological: Alert oriented x 4, grossly nonfocal Psychological:  Alert and cooperative. Normal mood and affect  Assessment and Recommendations:  1. GERD with erosive esophagitis. History of Barrett's. Continue Prevacid Solu-Tabs 30 mg po bid and antireflux measures long term. Surveillance EGD 02/2021.  2. Personal history of adenomatous colon polyps. Surveillance colonoscopy at 5 year interval in 02/2021.  3. IBS. Minimal intermittent symptoms.   I spent 15 minutes of face-to-face time with the patient. Greater than 50% of the time was spent counseling and coordinating care. She had several questions/concerns about Barrett's, PPIs, colon polyps.

## 2017-05-18 ENCOUNTER — Other Ambulatory Visit: Payer: Self-pay | Admitting: Family Medicine

## 2017-05-18 NOTE — Telephone Encounter (Signed)
Rx called in as prescribed 

## 2017-05-18 NOTE — Telephone Encounter (Signed)
Px written for call in   

## 2017-05-18 NOTE — Telephone Encounter (Signed)
CPE scheduled 12/27/17, last filled on 11/01/16 #30 tabs with 3 additional refills, please advise

## 2017-05-30 ENCOUNTER — Other Ambulatory Visit: Payer: Self-pay | Admitting: Family Medicine

## 2017-05-30 DIAGNOSIS — Z1231 Encounter for screening mammogram for malignant neoplasm of breast: Secondary | ICD-10-CM

## 2017-06-17 ENCOUNTER — Encounter: Payer: Self-pay | Admitting: Family Medicine

## 2017-06-17 ENCOUNTER — Ambulatory Visit (INDEPENDENT_AMBULATORY_CARE_PROVIDER_SITE_OTHER): Payer: Medicare HMO | Admitting: Family Medicine

## 2017-06-17 VITALS — BP 126/70 | HR 69 | Temp 97.4°F | Ht 64.0 in | Wt 169.4 lb

## 2017-06-17 DIAGNOSIS — Z23 Encounter for immunization: Secondary | ICD-10-CM

## 2017-06-17 DIAGNOSIS — L84 Corns and callosities: Secondary | ICD-10-CM | POA: Diagnosis not present

## 2017-06-17 NOTE — Progress Notes (Signed)
Subjective:    Patient ID: Jenny Mcfarland, female    DOB: 04/15/1951, 66 y.o.   MRN: [id number]  HPI Here for foot issue/ ? Spur   Pain in plantar= base of 3rd toe   Has a thickened area of skin  Feels like stepping on gravel  Bothers her if she is walking a lot or if no cushion in shoes  Aches occ   No trauma or injury  No new exercise program   Wants a flu shot   Wt Readings from Last 3 Encounters:  06/17/17 169 lb 6.4 oz (76.8 kg)  01/20/17 169 lb 9.6 oz (76.9 kg)  12/21/16 167 lb (75.8 kg)  29.08 kg/m   Osteopenia on dexa 2016  Patient Active Problem List   Diagnosis Date Noted  . Pre-ulcerative corn or callous 06/17/2017  . Welcome to Medicare preventive visit 12/21/2016  . Estrogen deficiency 06/04/2015  . Vitamin D deficiency 09/23/2014  . Thoracic or lumbosacral neuritis or radiculitis, unspecified 05/21/2013  . Gynecological examination 05/25/2011  . Other screening mammogram 05/25/2011  . Routine general medical examination at a health care facility 05/25/2011  . Osteopenia 12/27/2010  . IBS (irritable bowel syndrome) 12/25/2010  . Allergic rhinitis 12/25/2010  . Asthma 12/25/2010  . Mild hyperlipidemia 12/25/2010  . Family history of breast cancer 12/25/2010  . Family history of aortic aneurysm 12/25/2010  . GERD 11/08/2008  . BARRETT'S ESOPHAGUS 11/08/2008   Past Medical History:  Diagnosis Date  . Allergic rhinitis   . Asthma   . Barrett's esophagus 2005  . GERD (gastroesophageal reflux disease) 11/2008  . Hemorrhoids   . Hiatal hernia   . History of chicken pox   . IBS (irritable bowel syndrome)   . Osteopenia   . Peptic stricture of esophagus 11-2008  . Recurrent sinusitis   . Tubular adenoma of colon 02/2016   Past Surgical History:  Procedure Laterality Date  . CHOLECYSTECTOMY  2010  . TUBAL LIGATION     Social History  Substance Use Topics  . Smoking status: Never Smoker  . Smokeless tobacco: Never Used  . Alcohol use No     Family History  Problem Relation Age of Onset  . Aortic aneurysm Mother   . Heart disease Mother   . Heart attack Brother        early 40's  . Arthritis Brother   . Breast cancer Sister 61  . Colon cancer Neg Hx    Allergies  Allergen Reactions  . Levofloxacin Itching    Felt like she had bugs crawling all over her.   Current Outpatient Prescriptions on File Prior to Visit  Medication Sig Dispense Refill  . fexofenadine (ALLEGRA) 180 MG tablet Take 180 mg by mouth daily.      . Fluticasone-Salmeterol (ADVAIR DISKUS) 250-50 MCG/DOSE AEPB Inhale 1 puff into the lungs 2 (two) times daily as needed. 60 each 11  . lansoprazole (PREVACID SOLUTAB) 30 MG disintegrating tablet Take 1 tablet (30 mg total) by mouth 2 (two) times daily. 60 tablet 11  . zolpidem (AMBIEN) 5 MG tablet TAKE 1/2 TO 1 (ONE-HALF TO ONE) TABLET BY MOUTH AT BEDTIME AS NEEDED 30 tablet 3   No current facility-administered medications on file prior to visit.     Review of Systems  Constitutional: Negative for activity change, appetite change, fatigue, fever and unexpected weight change.  HENT: Negative for congestion, rhinorrhea, sore throat and trouble swallowing.   Eyes: Negative for pain, redness, itching and visual  disturbance.  Respiratory: Negative for cough, chest tightness, shortness of breath and wheezing.   Cardiovascular: Negative for chest pain and palpitations.  Gastrointestinal: Negative for abdominal pain, blood in stool, constipation, diarrhea and nausea.  Endocrine: Negative for cold intolerance, heat intolerance, polydipsia and polyuria.  Genitourinary: Negative for difficulty urinating, dysuria, frequency and urgency.  Musculoskeletal: Negative for arthralgias, joint swelling and myalgias.  Skin: Negative for pallor and rash.       Pain on bottom of L foot   Neurological: Negative for dizziness, tremors, weakness, numbness and headaches.  Hematological: Negative for adenopathy. Does not  bruise/bleed easily.  Psychiatric/Behavioral: Negative for decreased concentration and dysphoric mood. The patient is not nervous/anxious.        Objective:   Physical Exam  Constitutional: She appears well-developed and well-nourished.  overwt and well app  HENT:  Head: Normocephalic.  Eyes: Conjunctivae and EOM are normal.  Neck: Normal range of motion. Neck supple. No thyromegaly present.  Cardiovascular: Normal rate and regular rhythm.   Musculoskeletal: She exhibits no edema.  Skin: Skin is warm and dry.  3 mm area of hyperkeratotic skin on plantar surface of L foot under head of 2nd metatarsal (appears to be a callous)  No ulceration noted  Cleaned and debrided with no 11 scalpel today  Well tolerated  Relief was immediate            Assessment & Plan:   Problem List Items Addressed This Visit      Musculoskeletal and Integument   Pre-ulcerative corn or callous    Plantar- under base of 2nd metatarsal  Small- easily debrided with scalpel Immediate relief  inst given for aftercare   Keep clean /moisturize  Supportive shoes  Corn pad if helpful Update if not starting to improve in a week or if worsening         Other Visit Diagnoses    Need for immunization against influenza       Relevant Orders   Flu Vaccine QUAD 36+ mos IM (Completed)

## 2017-06-17 NOTE — Progress Notes (Signed)
Pre visit review using our clinic review tool, if applicable. No additional management support is needed unless otherwise documented below in the visit note. 

## 2017-06-17 NOTE — Patient Instructions (Signed)
We shaved/debrided your foot callous today Keep it clean  Moisturize regularly  Wear supportive shoes with cushion  A corn pad over the counter is fine also  Foot file/pumice stone is ok as well   Update if not starting to improve in a week or if worsening    Flu shot today

## 2017-06-17 NOTE — Assessment & Plan Note (Signed)
Plantar- under base of 2nd metatarsal  Small- easily debrided with scalpel Immediate relief  inst given for aftercare   Keep clean /moisturize  Supportive shoes  Corn pad if helpful Update if not starting to improve in a week or if worsening

## 2017-06-21 DIAGNOSIS — H1045 Other chronic allergic conjunctivitis: Secondary | ICD-10-CM | POA: Diagnosis not present

## 2017-06-21 DIAGNOSIS — J3089 Other allergic rhinitis: Secondary | ICD-10-CM | POA: Diagnosis not present

## 2017-06-21 DIAGNOSIS — J301 Allergic rhinitis due to pollen: Secondary | ICD-10-CM | POA: Diagnosis not present

## 2017-06-21 DIAGNOSIS — J4530 Mild persistent asthma, uncomplicated: Secondary | ICD-10-CM | POA: Diagnosis not present

## 2017-07-15 ENCOUNTER — Ambulatory Visit
Admission: RE | Admit: 2017-07-15 | Discharge: 2017-07-15 | Disposition: A | Discharge status: Home / Self Care | Payer: Medicare HMO | Source: Ambulatory Visit | Attending: Family Medicine | Admitting: Family Medicine

## 2017-07-15 DIAGNOSIS — R928 Other abnormal and inconclusive findings on diagnostic imaging of breast: Secondary | ICD-10-CM | POA: Diagnosis not present

## 2017-07-15 DIAGNOSIS — Z1231 Encounter for screening mammogram for malignant neoplasm of breast: Secondary | ICD-10-CM | POA: Diagnosis not present

## 2017-07-18 ENCOUNTER — Other Ambulatory Visit: Payer: Self-pay | Admitting: Family Medicine

## 2017-07-18 DIAGNOSIS — R928 Other abnormal and inconclusive findings on diagnostic imaging of breast: Secondary | ICD-10-CM

## 2017-07-18 DIAGNOSIS — N6489 Other specified disorders of breast: Secondary | ICD-10-CM

## 2017-07-26 ENCOUNTER — Ambulatory Visit
Admission: RE | Admit: 2017-07-26 | Discharge: 2017-07-26 | Disposition: A | Discharge status: Home / Self Care | Payer: Medicare HMO | Source: Ambulatory Visit | Attending: Family Medicine | Admitting: Family Medicine

## 2017-07-26 ENCOUNTER — Other Ambulatory Visit: Payer: Self-pay | Admitting: Family Medicine

## 2017-07-26 DIAGNOSIS — N6001 Solitary cyst of right breast: Secondary | ICD-10-CM | POA: Insufficient documentation

## 2017-07-26 DIAGNOSIS — R928 Other abnormal and inconclusive findings on diagnostic imaging of breast: Secondary | ICD-10-CM

## 2017-07-26 DIAGNOSIS — N6314 Unspecified lump in the right breast, lower inner quadrant: Secondary | ICD-10-CM | POA: Diagnosis not present

## 2017-07-26 DIAGNOSIS — N6489 Other specified disorders of breast: Secondary | ICD-10-CM

## 2017-07-26 DIAGNOSIS — N6310 Unspecified lump in the right breast, unspecified quadrant: Secondary | ICD-10-CM

## 2017-07-26 HISTORY — PX: BREAST BIOPSY: SHX20

## 2017-08-01 ENCOUNTER — Ambulatory Visit
Admission: RE | Admit: 2017-08-01 | Discharge: 2017-08-01 | Disposition: A | Discharge status: Home / Self Care | Payer: Medicare HMO | Source: Ambulatory Visit | Attending: Family Medicine | Admitting: Family Medicine

## 2017-08-01 DIAGNOSIS — R928 Other abnormal and inconclusive findings on diagnostic imaging of breast: Secondary | ICD-10-CM

## 2017-08-01 DIAGNOSIS — N6091 Unspecified benign mammary dysplasia of right breast: Secondary | ICD-10-CM | POA: Insufficient documentation

## 2017-08-01 DIAGNOSIS — N6310 Unspecified lump in the right breast, unspecified quadrant: Secondary | ICD-10-CM | POA: Insufficient documentation

## 2017-08-01 DIAGNOSIS — D241 Benign neoplasm of right breast: Secondary | ICD-10-CM | POA: Diagnosis not present

## 2017-08-01 DIAGNOSIS — N6314 Unspecified lump in the right breast, lower inner quadrant: Secondary | ICD-10-CM | POA: Diagnosis not present

## 2017-08-02 LAB — SURGICAL PATHOLOGY

## 2017-08-04 ENCOUNTER — Ambulatory Visit: Payer: Medicare HMO

## 2017-08-04 ENCOUNTER — Other Ambulatory Visit: Payer: Medicare HMO

## 2017-08-05 ENCOUNTER — Telehealth: Payer: Self-pay | Admitting: Family Medicine

## 2017-08-05 ENCOUNTER — Telehealth: Payer: Self-pay | Admitting: Gastroenterology

## 2017-08-05 NOTE — Telephone Encounter (Signed)
Patient returning call to Amanda. °

## 2017-08-05 NOTE — Telephone Encounter (Signed)
Spoke with Langley Gauss from [facility] stating we can initiate PA for a non-formulary medication for 2019 but this medication is not eligable for tier reduction. The plan this next year does not let us do a tier reduction on non-formulary medications so it could be a higher co-pay. I informed Langley Gauss that I want to still go ahead with the non-formulary PA. Prevacid was approved starting 08/28/17-08/28/18. Called patient with no answer and no voicemail.

## 2017-08-05 NOTE — Telephone Encounter (Signed)
Left a message for patient to return my call. 

## 2017-08-05 NOTE — Telephone Encounter (Signed)
Informed patient that medication was approved for 2019 but still could be a higher co-pay. Patient states she call her insurance company and find out how much it will cost for 2019.

## 2017-08-05 NOTE — Telephone Encounter (Signed)
Copied from [place] [phone]. Topic: General - Other >> Aug 05, 2017 11:18 AM Darl Householder, RMA wrote: Reason for CRM: patient needs a prior auth for Ambien 5 mg, please call [employer] at [phone], option 2

## 2017-08-16 ENCOUNTER — Other Ambulatory Visit: Payer: Self-pay | Admitting: Surgery

## 2017-08-16 DIAGNOSIS — N63 Unspecified lump in unspecified breast: Secondary | ICD-10-CM | POA: Diagnosis not present

## 2017-08-30 HISTORY — PX: BREAST LUMPECTOMY: SHX2

## 2017-09-02 ENCOUNTER — Other Ambulatory Visit: Payer: Self-pay

## 2017-09-02 ENCOUNTER — Encounter
Admission: RE | Admit: 2017-09-02 | Discharge: 2017-09-02 | Disposition: A | Discharge status: Home / Self Care | Payer: Medicare HMO | Source: Ambulatory Visit | Attending: Surgery | Admitting: Surgery

## 2017-09-02 HISTORY — DX: Other specified postprocedural states: Z98.890

## 2017-09-02 HISTORY — DX: Nausea with vomiting, unspecified: R11.2

## 2017-09-02 HISTORY — DX: Other complications of anesthesia, initial encounter: T88.59XA

## 2017-09-02 HISTORY — DX: Adverse effect of unspecified anesthetic, initial encounter: T41.45XA

## 2017-09-02 NOTE — Patient Instructions (Signed)
  Your procedure is scheduled on: 09-09-16 FRIDAY Report to [place] @ 7:45 AM  Remember: Instructions that are not followed completely may result in serious medical risk, up to and including death, or upon the discretion of your surgeon and anesthesiologist your surgery may need to be rescheduled.    _x___ 1. Do not eat food after midnight the night before your procedure. NO GUM OR CANDY AFTER MIDNIGHT.  You may drink clear liquids up to 2 hours before you are scheduled to arrive at the hospital for your procedure.  Do not drink clear liquids within 2 hours of your scheduled arrival to the hospital.  Clear liquids include  --Water or Apple juice without pulp  --Clear carbohydrate beverage such as ClearFast or Gatorade  --Black Coffee or Clear Tea (No milk, no creamers, do not add anything to the coffee or Tea  .     __x__ 2. No Alcohol for 24 hours before or after surgery.   __x__3. No Smoking for 24 prior to surgery.   ____  4. Bring all medications with you on the day of surgery if instructed.    __x__ 5. Notify your doctor if there is any change in your medical condition     (cold, fever, infections).     Do not wear jewelry, make-up, hairpins, clips or nail polish.  Do not wear lotions, powders, or perfumes. You may wear deodorant.  Do not shave 48 hours prior to surgery. Men may shave face and neck.  Do not bring valuables to the hospital.    [facility] is not responsible for any belongings or valuables.               Contacts, dentures or bridgework may not be worn into surgery.  Leave your suitcase in the car. After surgery it may be brought to your room.  For patients admitted to the hospital, discharge time is determined by your treatment team.   Patients discharged the day of surgery will not be allowed to drive home.  You will need someone to drive you home and stay with you the night of your procedure.    Please read over the following fact sheets that  you were given:   [facility] Preparing for Surgery and or MRSA Information   _x___ TAKE THE FOLLOWING MEDICATION THE MORNING OF SURGERY WITH  A SMALL SIP OF WATER. These include:  1. ALLEGRA  2. PREVACID  3.  4.  5.  6.  ____Fleets enema or Magnesium Citrate as directed.   _x___ Use CHG Soap or sage wipes as directed on instruction sheet   _X___ Use inhalers on the day of surgery and bring to hospital day of [place]  ____ Stop Metformin and Janumet 2 days prior to surgery.    ____ Take 1/2 of usual insulin dose the night before surgery and none on the morning surgery.   ____ Follow recommendations from Cardiologist, Pulmonologist or PCP regarding stopping Aspirin, Coumadin, Plavix ,Eliquis, Effient, or Pradaxa, and Pletal.  X____Stop Anti-inflammatories such as Advil, Aleve, Ibuprofen, Motrin, Naproxen, Naprosyn, Goodies powders or aspirin products NOW-OK to take Tylenol    ____ Stop supplements until after surgery   ____ Bring C-Pap to the hospital.

## 2017-09-05 ENCOUNTER — Encounter
Admission: RE | Admit: 2017-09-05 | Discharge: 2017-09-05 | Disposition: A | Discharge status: Home / Self Care | Payer: Medicare HMO | Source: Ambulatory Visit | Attending: Surgery | Admitting: Surgery

## 2017-09-05 DIAGNOSIS — N62 Hypertrophy of breast: Secondary | ICD-10-CM | POA: Diagnosis not present

## 2017-09-05 DIAGNOSIS — Z79899 Other long term (current) drug therapy: Secondary | ICD-10-CM | POA: Insufficient documentation

## 2017-09-05 DIAGNOSIS — Z01810 Encounter for preprocedural cardiovascular examination: Secondary | ICD-10-CM | POA: Diagnosis not present

## 2017-09-05 DIAGNOSIS — J45909 Unspecified asthma, uncomplicated: Secondary | ICD-10-CM | POA: Insufficient documentation

## 2017-09-05 DIAGNOSIS — Z888 Allergy status to other drugs, medicaments and biological substances status: Secondary | ICD-10-CM | POA: Diagnosis not present

## 2017-09-05 DIAGNOSIS — K219 Gastro-esophageal reflux disease without esophagitis: Secondary | ICD-10-CM | POA: Diagnosis not present

## 2017-09-05 DIAGNOSIS — Z825 Family history of asthma and other chronic lower respiratory diseases: Secondary | ICD-10-CM | POA: Diagnosis not present

## 2017-09-05 DIAGNOSIS — D241 Benign neoplasm of right breast: Secondary | ICD-10-CM | POA: Diagnosis not present

## 2017-09-05 DIAGNOSIS — Z803 Family history of malignant neoplasm of breast: Secondary | ICD-10-CM | POA: Diagnosis not present

## 2017-09-05 DIAGNOSIS — Z9049 Acquired absence of other specified parts of digestive tract: Secondary | ICD-10-CM | POA: Insufficient documentation

## 2017-09-05 DIAGNOSIS — R9431 Abnormal electrocardiogram [ECG] [EKG]: Secondary | ICD-10-CM | POA: Insufficient documentation

## 2017-09-05 DIAGNOSIS — R001 Bradycardia, unspecified: Secondary | ICD-10-CM | POA: Insufficient documentation

## 2017-09-05 DIAGNOSIS — Z8249 Family history of ischemic heart disease and other diseases of the circulatory system: Secondary | ICD-10-CM | POA: Insufficient documentation

## 2017-09-05 NOTE — Pre-Procedure Instructions (Signed)
EKG'S REVIEWED BY DR P CARROLL AND OK

## 2017-09-09 ENCOUNTER — Other Ambulatory Visit: Payer: Self-pay

## 2017-09-09 ENCOUNTER — Ambulatory Visit
Admission: RE | Admit: 2017-09-09 | Discharge: 2017-09-09 | Disposition: A | Discharge status: Home / Self Care | Payer: Medicare HMO | Source: Ambulatory Visit | Attending: Surgery | Admitting: Surgery

## 2017-09-09 ENCOUNTER — Encounter
Admission: RE | Disposition: A | Discharge status: Home / Self Care | Payer: Self-pay | Source: Ambulatory Visit | Attending: Surgery

## 2017-09-09 ENCOUNTER — Ambulatory Visit: Payer: Medicare HMO | Admitting: Anesthesiology

## 2017-09-09 DIAGNOSIS — D241 Benign neoplasm of right breast: Secondary | ICD-10-CM | POA: Diagnosis not present

## 2017-09-09 DIAGNOSIS — N63 Unspecified lump in unspecified breast: Secondary | ICD-10-CM

## 2017-09-09 DIAGNOSIS — M8580 Other specified disorders of bone density and structure, unspecified site: Secondary | ICD-10-CM | POA: Insufficient documentation

## 2017-09-09 DIAGNOSIS — N6310 Unspecified lump in the right breast, unspecified quadrant: Secondary | ICD-10-CM | POA: Diagnosis not present

## 2017-09-09 DIAGNOSIS — Z7951 Long term (current) use of inhaled steroids: Secondary | ICD-10-CM | POA: Insufficient documentation

## 2017-09-09 DIAGNOSIS — N6011 Diffuse cystic mastopathy of right breast: Secondary | ICD-10-CM | POA: Diagnosis not present

## 2017-09-09 DIAGNOSIS — J45909 Unspecified asthma, uncomplicated: Secondary | ICD-10-CM | POA: Diagnosis not present

## 2017-09-09 DIAGNOSIS — N6314 Unspecified lump in the right breast, lower inner quadrant: Secondary | ICD-10-CM | POA: Diagnosis not present

## 2017-09-09 DIAGNOSIS — Z79899 Other long term (current) drug therapy: Secondary | ICD-10-CM | POA: Diagnosis not present

## 2017-09-09 DIAGNOSIS — K219 Gastro-esophageal reflux disease without esophagitis: Secondary | ICD-10-CM | POA: Insufficient documentation

## 2017-09-09 HISTORY — PX: BREAST LUMPECTOMY WITH NEEDLE LOCALIZATION: SHX5759

## 2017-09-09 SURGERY — BREAST LUMPECTOMY WITH NEEDLE LOCALIZATION
Anesthesia: General | Laterality: Right | Wound class: Clean

## 2017-09-09 MED ORDER — BUPIVACAINE-EPINEPHRINE 0.5% -1:200000 IJ SOLN
INTRAMUSCULAR | Status: DC | PRN
  Administered 2017-09-09: 15 mL

## 2017-09-09 MED ORDER — ROCURONIUM BROMIDE 100 MG/10ML IV SOLN
INTRAVENOUS | Status: DC | PRN
  Administered 2017-09-09: 20 mg via INTRAVENOUS

## 2017-09-09 MED ORDER — BUPIVACAINE-EPINEPHRINE (PF) 0.5% -1:200000 IJ SOLN
INTRAMUSCULAR | Status: AC
  Filled 2017-09-09: qty 30

## 2017-09-09 MED ORDER — HYDROCODONE-ACETAMINOPHEN 5-325 MG PO TABS: 1.0000 | ORAL_TABLET | Freq: Four times a day (QID) | ORAL | Status: DC | PRN

## 2017-09-09 MED ORDER — FENTANYL CITRATE (PF) 100 MCG/2ML IJ SOLN: 25.0000 ug | INTRAMUSCULAR | Status: DC | PRN

## 2017-09-09 MED ORDER — ONDANSETRON HCL 4 MG/2ML IJ SOLN: 4.0000 mg | Freq: Once | INTRAMUSCULAR | Status: DC | PRN

## 2017-09-09 MED ORDER — FENTANYL CITRATE (PF) 100 MCG/2ML IJ SOLN
INTRAMUSCULAR | Status: DC | PRN
  Administered 2017-09-09 (×2): 50 ug via INTRAVENOUS

## 2017-09-09 MED ORDER — HYDROMORPHONE HCL 1 MG/ML IJ SOLN
INTRAMUSCULAR | Status: AC
  Filled 2017-09-09: qty 1

## 2017-09-09 MED ORDER — MIDAZOLAM HCL 2 MG/2ML IJ SOLN
INTRAMUSCULAR | Status: DC | PRN
  Administered 2017-09-09: 2 mg via INTRAVENOUS

## 2017-09-09 MED ORDER — FENTANYL CITRATE (PF) 250 MCG/5ML IJ SOLN
INTRAMUSCULAR | Status: AC
  Filled 2017-09-09: qty 5

## 2017-09-09 MED ORDER — HYDROCODONE-ACETAMINOPHEN 5-325 MG PO TABS
ORAL_TABLET | ORAL | Status: AC
  Administered 2017-09-09: 1 via ORAL
  Filled 2017-09-09: qty 1

## 2017-09-09 MED ORDER — SEVOFLURANE IN SOLN
RESPIRATORY_TRACT | Status: AC
  Filled 2017-09-09: qty 250

## 2017-09-09 MED ORDER — LACTATED RINGERS IV SOLN
INTRAVENOUS | Status: DC | PRN
  Administered 2017-09-09: 10:00:00 via INTRAVENOUS

## 2017-09-09 MED ORDER — SUGAMMADEX SODIUM 200 MG/2ML IV SOLN
INTRAVENOUS | Status: DC | PRN
  Administered 2017-09-09: 152.4 mg via INTRAVENOUS

## 2017-09-09 MED ORDER — LACTATED RINGERS IV SOLN
INTRAVENOUS | Status: DC
  Administered 2017-09-09: 09:00:00 via INTRAVENOUS

## 2017-09-09 MED ORDER — HYDROCODONE-ACETAMINOPHEN 5-325 MG PO TABS: 1.0000 | ORAL_TABLET | Freq: Four times a day (QID) | ORAL | 0 refills | Status: DC | PRN

## 2017-09-09 MED ORDER — MIDAZOLAM HCL 2 MG/2ML IJ SOLN
INTRAMUSCULAR | Status: AC
  Filled 2017-09-09: qty 2

## 2017-09-09 MED ORDER — ONDANSETRON HCL 4 MG/2ML IJ SOLN
INTRAMUSCULAR | Status: DC | PRN
  Administered 2017-09-09: 4 mg via INTRAVENOUS

## 2017-09-09 SURGICAL SUPPLY — 30 items
ADH SKN CLS APL DERMABOND .7 (GAUZE/BANDAGES/DRESSINGS) ×1
BLADE SURG 15 STRL LF DISP TIS (BLADE) ×1 IMPLANT
BLADE SURG 15 STRL SS (BLADE) ×1
CANISTER SUCT 1200ML W/VALVE (MISCELLANEOUS) ×2 IMPLANT
CHLORAPREP W/TINT 26ML (MISCELLANEOUS) ×2 IMPLANT
DERMABOND ADVANCED (GAUZE/BANDAGES/DRESSINGS) ×1
DERMABOND ADVANCED .7 DNX12 (GAUZE/BANDAGES/DRESSINGS) ×1 IMPLANT
DEVICE DUBIN SPECIMEN MAMMOGRA (MISCELLANEOUS) ×2 IMPLANT
DRAPE LAPAROTOMY 77X122 PED (DRAPES) ×2 IMPLANT
ELECT REM PT RETURN 9FT ADLT (ELECTROSURGICAL) ×2
ELECTRODE REM PT RTRN 9FT ADLT (ELECTROSURGICAL) ×1 IMPLANT
GLOVE BIO SURGEON STRL SZ7.5 (GLOVE) ×6 IMPLANT
GLOVE BIOGEL PI IND STRL 6.5 (GLOVE) ×3 IMPLANT
GLOVE BIOGEL PI INDICATOR 6.5 (GLOVE) ×3
GOWN STRL REUS W/ TWL LRG LVL3 (GOWN DISPOSABLE) ×2 IMPLANT
GOWN STRL REUS W/TWL LRG LVL3 (GOWN DISPOSABLE) ×4
KIT RM TURNOVER STRD PROC AR (KITS) ×2 IMPLANT
LABEL OR SOLS (LABEL) ×2 IMPLANT
MARGIN MAP 10MM (MISCELLANEOUS) ×2 IMPLANT
NEEDLE HYPO 25X1 1.5 SAFETY (NEEDLE) ×2 IMPLANT
PACK BASIN MINOR ARMC (MISCELLANEOUS) ×2 IMPLANT
SUT CHROMIC 3 0 SH 27 (SUTURE) IMPLANT
SUT CHROMIC 4 0 RB 1X27 (SUTURE) ×2 IMPLANT
SUT ETHILON 3-0 FS-10 30 BLK (SUTURE) ×2
SUT MNCRL 4-0 (SUTURE) ×2
SUT MNCRL 4-0 27XMFL (SUTURE) ×1
SUTURE EHLN 3-0 FS-10 30 BLK (SUTURE) ×1 IMPLANT
SUTURE MNCRL 4-0 27XMF (SUTURE) ×1 IMPLANT
SYR 10ML LL (SYRINGE) ×2 IMPLANT
WATER STERILE IRR 1000ML POUR (IV SOLUTION) ×2 IMPLANT

## 2017-09-09 NOTE — Op Note (Signed)
OPERATIVE REPORT  PREOPERATIVE  DIAGNOSIS: .  Right breast mass  POSTOPERATIVE DIAGNOSIS: .  Right breast mass  PROCEDURE: .  Excision right breast mass  ANESTHESIA:  General  SURGEON: Rochel Brome  MD   INDICATIONS: .  She had recent ultrasound-guided core needle biopsy findings of intraductal papilloma of the lower inner quadrant of the right breast.  Excision was recommended for further evaluation and treatment.  She did have preoperative insertion of Kopans wire.  Mammogram images were reviewed prior to incision.  With the patient on the operating table in the supine position under general anesthesia the dressing was removed from the right breast exposing the Kopan's wire which entered the medial aspect of the breast.  The wire was cut 2 cm from the skin.  The site was prepared with ChloraPrep and draped in a sterile manner.  A curvilinear incision was made from approximately 2:00 to 4 o'clock position 4 cm from the nipple.  The incision was carried down through subcutaneous tissues.  Several small bleeding points were cauterized.  The Kopans wire was encountered.  A portion of tissue surrounding the wire extending beyond the tip of the wire was excised.  There was no significant grossly palpable mass.  The specimen was labeled with margin maps using nylon sutures to label the medial lateral cranial caudal superficial and deep margins.  The specimen was submitted for specimen mammogram demonstrating the location of the biopsy marker within the specimen.  The specimen was submitted for routine pathology.  The wound was inspected and small bleeding points were cauterized.  Tissues surrounding cautery artifact were infiltrated with 0.5% Sensorcaine with epinephrine.  Also subcuticular tissues were infiltrated.  Subcutaneous tissues were approximated with interrupted 4-0 chromic.  The skin was closed with running 4-0 Monocryl subcuticular suture and Dermabond.  The patient tolerated surgery  satisfactorily and was prepared for transfer to the recovery room  Rochel Brome MD

## 2017-09-09 NOTE — Discharge Instructions (Addendum)
Take Tylenol or Norco if needed for pain.  Should not drive or do anything dangerous when taking Norco.  May shower and blot dry.  Wear bra as desired for comfort and support.  AMBULATORY SURGERY  DISCHARGE INSTRUCTIONS   1) The drugs that you were given will stay in your system until tomorrow so for the next 24 hours you should not:  A) Drive an automobile B) Make any legal decisions C) Drink any alcoholic beverage   2) You may resume regular meals tomorrow.  Today it is better to start with liquids and gradually work up to solid foods.  You may eat anything you prefer, but it is better to start with liquids, then soup and crackers, and gradually work up to solid foods.   3) Please notify your doctor immediately if you have any unusual bleeding, trouble breathing, redness and pain at the surgery site, drainage, fever, or pain not relieved by medication.    4) Additional Instructions:        Please contact your physician with any problems or Same Day Surgery at [phone], Monday through Friday 6 am to 4 pm, or [place] [facility] number at [phone].

## 2017-09-09 NOTE — Progress Notes (Signed)
Dr. Tamala Julian into see

## 2017-09-09 NOTE — Progress Notes (Signed)
She comes for excision of right breast mass.  She has had Kopans wire insertion.  Mammograms viewed.  She reports no change in condition since office exam.  Right side marked YES.  Discussed plan for surgery.

## 2017-09-09 NOTE — Anesthesia Post-op Follow-up Note (Signed)
Anesthesia QCDR form completed.        

## 2017-09-09 NOTE — Anesthesia Postprocedure Evaluation (Signed)
Anesthesia Post Note  Patient: Jenny Mcfarland  Procedure(s) Performed: BREAST LUMPECTOMY WITH NEEDLE LOCALIZATION (Right )  Patient location during evaluation: PACU Anesthesia Type: General Level of consciousness: awake and alert and oriented Pain management: pain level controlled Vital Signs Assessment: post-procedure vital signs reviewed and stable Respiratory status: spontaneous breathing Cardiovascular status: blood pressure returned to baseline Anesthetic complications: no     Last Vitals:  Vitals:   09/09/17 1238 09/09/17 1323  BP: (!) 138/56 (!) 146/64  Pulse: 96 72  Resp: 16   Temp: (!) 35.9 C   SpO2: 94% 98%    Last Pain:  Vitals:   09/09/17 1323  TempSrc:   PainSc: 3                  Shakenna Herrero

## 2017-09-09 NOTE — Transfer of Care (Signed)
Immediate Anesthesia Transfer of Care Note  Patient: Jenny Mcfarland  Procedure(s) Performed: BREAST LUMPECTOMY WITH NEEDLE LOCALIZATION (Right )  Patient Location: PACU  Anesthesia Type:General  Level of Consciousness: awake, alert  and oriented  Airway & Oxygen Therapy: Patient Spontanous Breathing and Patient connected to nasal cannula oxygen  Post-op Assessment: Report given to RN and Post -op Vital signs reviewed and stable  Post vital signs: stable  Last Vitals:  Vitals:   09/09/17 0843 09/09/17 1145  BP: 134/60 (!) 152/62  Pulse: 60 77  Resp: 16 12  Temp: (!) 35.7 C (!) 36.2 C  SpO2: 100% 100%    Last Pain:  Vitals:   09/09/17 0843  TempSrc: Temporal         Complications: No apparent anesthesia complications

## 2017-09-09 NOTE — Anesthesia Preprocedure Evaluation (Signed)
Anesthesia Evaluation  Patient identified by MRN, date of birth, ID band Patient awake    Reviewed: Allergy & Precautions, NPO status , Patient's Chart, lab work & pertinent test results  History of Anesthesia Complications (+) PONV and history of anesthetic complications  Airway Mallampati: III  TM Distance: <3 FB     Dental  (+) Chipped, Caps   Pulmonary asthma ,    Pulmonary exam normal        Cardiovascular negative cardio ROS Normal cardiovascular exam     Neuro/Psych negative neurological ROS  negative psych ROS   GI/Hepatic Neg liver ROS, hiatal hernia, GERD  Medicated and Controlled,  Endo/Other  negative endocrine ROS  Renal/GU negative Renal ROS  negative genitourinary   Musculoskeletal negative musculoskeletal ROS (+)   Abdominal   Peds negative pediatric ROS (+)  Hematology negative hematology ROS (+)   Anesthesia Other Findings Past Medical History: No date: Allergic rhinitis No date: Asthma     Comment:  WELL CONTROLLED 2005: Barrett's esophagus No date: Complication of anesthesia 11/2008: GERD (gastroesophageal reflux disease) No date: Hemorrhoids No date: Hiatal hernia No date: History of chicken pox No date: IBS (irritable bowel syndrome) No date: Osteopenia 11-2008: Peptic stricture of esophagus No date: PONV (postoperative nausea and vomiting)     Comment:  N/V WITH GALLBLADDER No date: Recurrent sinusitis  Reproductive/Obstetrics                             Anesthesia Physical Anesthesia Plan  ASA: II  Anesthesia Plan: General   Post-op Pain Management:    Induction: Intravenous, Rapid sequence and Cricoid pressure planned  PONV Risk Score and Plan:   Airway Management Planned: Oral ETT  Additional Equipment:   Intra-op Plan:   Post-operative Plan: Extubation in OR  Informed Consent: I have reviewed the patients History and Physical, chart,  labs and discussed the procedure including the risks, benefits and alternatives for the proposed anesthesia with the patient or authorized representative who has indicated his/her understanding and acceptance.   Dental advisory given  Plan Discussed with: Surgeon and CRNA  Anesthesia Plan Comments:         Anesthesia Quick Evaluation

## 2017-09-09 NOTE — Anesthesia Procedure Notes (Signed)
Procedure Name: Intubation Date/Time: 09/09/2017 10:28 AM Performed by: Justus Memory, CRNA Pre-anesthesia Checklist: Patient identified, Patient being monitored, Timeout performed, Emergency Drugs available and Suction available Patient Re-evaluated:Patient Re-evaluated prior to induction Oxygen Delivery Method: Circle system utilized Preoxygenation: Pre-oxygenation with 100% oxygen Induction Type: IV induction Ventilation: Mask ventilation without difficulty Laryngoscope Size: Mac and 3 Grade View: Grade I Tube type: Oral Tube size: 7.0 mm Number of attempts: 1 Airway Equipment and Method: Stylet Placement Confirmation: ETT inserted through vocal cords under direct vision,  positive ETCO2 and breath sounds checked- equal and bilateral Secured at: 21 cm Tube secured with: Tape Dental Injury: Teeth and Oropharynx as per pre-operative assessment

## 2017-09-10 ENCOUNTER — Encounter: Payer: Self-pay | Admitting: Surgery

## 2017-09-13 LAB — SURGICAL PATHOLOGY

## 2017-10-26 ENCOUNTER — Telehealth: Payer: Self-pay | Admitting: Oncology

## 2017-10-26 NOTE — Telephone Encounter (Signed)
Please fit this patient in on Friday. I barely have any patients. Doesn't matter new slot or not.

## 2017-10-26 NOTE — Telephone Encounter (Signed)
Returned V/M, per patient call msg/Diana regarding r/s appt. Called patient numerous times. Mobile phone has no voicemail set up. Left a detailed message on patient's home v/m with my direct phone number for her to contact me directly..  Patient's appt for 11/04/17, was a date requested by patient, per referral received from her referring MD.

## 2017-10-26 NOTE — Telephone Encounter (Signed)
Consultation for Atypical Cells in Lumpectomy. Ref by Dr. Rochel Brome. New patient packet given to Registration for patient to complete upon arrival. Notes in Epic. (Date per patient request)  L/M on patient Home V/M. Mobile phone has no V/M set up.

## 2017-11-04 ENCOUNTER — Encounter: Payer: Self-pay | Admitting: Oncology

## 2017-11-04 ENCOUNTER — Encounter (INDEPENDENT_AMBULATORY_CARE_PROVIDER_SITE_OTHER): Payer: Self-pay

## 2017-11-04 ENCOUNTER — Inpatient Hospital Stay: Payer: Medicare HMO | Attending: Oncology | Admitting: Oncology

## 2017-11-04 ENCOUNTER — Other Ambulatory Visit: Payer: Self-pay

## 2017-11-04 VITALS — BP 145/83 | HR 66 | Temp 96.9°F | Resp 15 | Ht 65.0 in | Wt 168.0 lb

## 2017-11-04 DIAGNOSIS — K449 Diaphragmatic hernia without obstruction or gangrene: Secondary | ICD-10-CM | POA: Diagnosis not present

## 2017-11-04 DIAGNOSIS — K589 Irritable bowel syndrome without diarrhea: Secondary | ICD-10-CM | POA: Diagnosis not present

## 2017-11-04 DIAGNOSIS — E559 Vitamin D deficiency, unspecified: Secondary | ICD-10-CM | POA: Diagnosis not present

## 2017-11-04 DIAGNOSIS — N6091 Unspecified benign mammary dysplasia of right breast: Secondary | ICD-10-CM | POA: Insufficient documentation

## 2017-11-04 DIAGNOSIS — J45909 Unspecified asthma, uncomplicated: Secondary | ICD-10-CM

## 2017-11-04 DIAGNOSIS — Z79899 Other long term (current) drug therapy: Secondary | ICD-10-CM | POA: Insufficient documentation

## 2017-11-04 DIAGNOSIS — E785 Hyperlipidemia, unspecified: Secondary | ICD-10-CM | POA: Diagnosis not present

## 2017-11-04 DIAGNOSIS — K219 Gastro-esophageal reflux disease without esophagitis: Secondary | ICD-10-CM | POA: Insufficient documentation

## 2017-11-04 DIAGNOSIS — Z803 Family history of malignant neoplasm of breast: Secondary | ICD-10-CM | POA: Insufficient documentation

## 2017-11-04 DIAGNOSIS — M8580 Other specified disorders of bone density and structure, unspecified site: Secondary | ICD-10-CM | POA: Diagnosis not present

## 2017-11-04 NOTE — Progress Notes (Signed)
Hematology/Oncology Consult note [facility] Telephone:([phone] [phone]  Patient Care Team: Tower, [name], MD as PCP - General (Family Medicine)   Name of the patient: Jenny Mcfarland  [id number]  12-21-1950    Reason for referral- atypical lobular hyperplasia   Referring physician- Dr. Tamala Julian  Date of visit: 11/04/17   History of presenting illness-patient is a 67 year old female who underwent a screening mammogram in November 2018 which showed possible asymmetry in the right breast.  Additional films showed 7 mm mass in the lower inner quadrant of the right breast.  Ultrasound demonstrated irregular microlobulated mass with indistinct margins at 4 o'clock position 3 cm from the nipple measuring 7 x 4 x 4 mm.  A complicated cyst was also noted in the right breast.  No abnormal axillary lymph nodes were noted.  Patient underwent core biopsy of the right breast mass which showed intraductal papilloma along with atypical lobular hyperplasia and usual ductal hyperplasia.  Negative for malignancy.  She was then seen by Dr. Tamala Julian and underwent lumpectomy which showed a 5 mm intraductal papilloma along with atypical lobular hyperplasia and background fibrocystic change.  Focal changes consistent with radial scar.  Surgical margins were negative  She has been referred to [place] for atypical lobular hyperplasia.  Family history significant for breast cancer in her sister patient had menarche at the age of 15 in her 64s.  And menopause at the age of 67.  She is G2P2 L2.  Used hormone contraception for 5 years.  She is currently not on any hormone replacement therapy.  She has not had any prior breast biopsies other than the one in January.  Patient lives alone and she is independent of her ADLs and IADLs and denies any complaints today.    ECOG PS- 0  Pain scale- 0   Review of systems- Review of Systems  Constitutional: Negative for chills, fever,  malaise/fatigue and weight loss.  HENT: Negative for congestion, ear discharge and nosebleeds.   Eyes: Negative for blurred vision.  Respiratory: Negative for cough, hemoptysis, sputum production, shortness of breath and wheezing.   Cardiovascular: Negative for chest pain, palpitations, orthopnea and claudication.  Gastrointestinal: Negative for abdominal pain, blood in stool, constipation, diarrhea, heartburn, melena, nausea and vomiting.  Genitourinary: Negative for dysuria, flank pain, frequency, hematuria and urgency.  Musculoskeletal: Negative for back pain, joint pain and myalgias.  Skin: Negative for rash.  Neurological: Negative for dizziness, tingling, focal weakness, seizures, weakness and headaches.  Endo/Heme/Allergies: Does not bruise/bleed easily.  Psychiatric/Behavioral: Negative for depression and suicidal ideas. The patient does not have insomnia.     Allergies  Allergen Reactions  . Levofloxacin Itching and Other (See Comments)    Felt like she had bugs crawling all over her.    Patient Active Problem List   Diagnosis Date Noted  . Pre-ulcerative corn or callous 06/17/2017  . Welcome to Medicare preventive visit 12/21/2016  . Estrogen deficiency 06/04/2015  . Vitamin D deficiency 09/23/2014  . Thoracic or lumbosacral neuritis or radiculitis, unspecified 05/21/2013  . Gynecological examination 05/25/2011  . Other screening mammogram 05/25/2011  . Routine general medical examination at a health care facility 05/25/2011  . Osteopenia 12/27/2010  . IBS (irritable bowel syndrome) 12/25/2010  . Allergic rhinitis 12/25/2010  . Asthma 12/25/2010  . Mild hyperlipidemia 12/25/2010  . Family history of breast cancer 12/25/2010  . Family history of aortic aneurysm 12/25/2010  . GERD 11/08/2008  . BARRETT'S ESOPHAGUS 11/08/2008  Past Medical History:  Diagnosis Date  . Allergic rhinitis   . Asthma    WELL CONTROLLED  . Barrett's esophagus 2005  . Complication  of anesthesia   . GERD (gastroesophageal reflux disease) 11/2008  . Hemorrhoids   . Hiatal hernia   . History of chicken pox   . IBS (irritable bowel syndrome)   . Osteopenia   . Peptic stricture of esophagus 11-2008  . PONV (postoperative nausea and vomiting)    N/V WITH GALLBLADDER  . Recurrent sinusitis      Past Surgical History:  Procedure Laterality Date  . BREAST BIOPSY Right 07/26/2017   INTRADUCTAL PAPILLOMA.   Marland Kitchen BREAST LUMPECTOMY WITH NEEDLE LOCALIZATION Right 09/09/2017   Procedure: BREAST LUMPECTOMY WITH NEEDLE LOCALIZATION;  Surgeon: Leonie Green, MD;  Location: [facility] ORS;  Service: General;  Laterality: Right;  . CHOLECYSTECTOMY  2010  . TUBAL LIGATION      Social History   Socioeconomic History  . Marital status: Single    Spouse name: Not on file  . Number of children: 2  . Years of education: Not on file  . Highest education level: Not on file  Social Needs  . Financial resource strain: Not on file  . Food insecurity - worry: Not on file  . Food insecurity - inability: Not on file  . Transportation needs - medical: Not on file  . Transportation needs - non-medical: Not on file  Occupational History  . 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 Smoking status: Never Smoker  . Smokeless tobacco: Never Used  Substance and Sexual Activity  . Alcohol use: Yes    Alcohol/week: 0.0 oz    Comment: RARE  . Drug use: No  . Sexual activity: Not on file  Other Topics Concern  . Not on file  Social History Narrative   Daily caffeine use - soft drinks/tea       Separating from husband 1/17     Family History  Problem Relation Age of Onset  . Aortic aneurysm Mother   . Heart disease Mother   . Heart attack Brother        early 19's  . Arthritis Brother   . Breast cancer Sister 67  . Colon cancer Neg Hx      Current Outpatient Medications:  .  Cholecalciferol (VITAMIN D3 PO), Take 1 capsule by mouth 3 (three) times a week.,  Disp: , Rfl:  .  fexofenadine (ALLEGRA) 180 MG tablet, Take 180 mg by mouth every morning. , Disp: , Rfl:  .  Fluticasone-Salmeterol (ADVAIR DISKUS) 250-50 MCG/DOSE AEPB, Inhale 1 puff into the lungs 2 (two) times daily as needed. (Patient taking differently: Inhale 1 puff into the lungs 2 (two) times daily. ), Disp: 60 each, Rfl: 11 .  lansoprazole (PREVACID SOLUTAB) 30 MG disintegrating tablet, Take 1 tablet (30 mg total) by mouth 2 (two) times daily., Disp: 60 tablet, Rfl: 11 .  levalbuterol (XOPENEX HFA) 45 MCG/ACT inhaler, Inhale 1 puff into the lungs every 4 (four) hours as needed for wheezing., Disp: , Rfl:  .  zolpidem (AMBIEN) 5 MG tablet, TAKE 1/2 TO 1 (ONE-HALF TO ONE) TABLET BY MOUTH AT BEDTIME AS NEEDED (Patient taking differently: Take 2.5 mg by mouth at bedtime), Disp: 30 tablet, Rfl: 3   Physical exam:  Vitals:   11/04/17 1111  BP: (!) 145/83  Pulse: 66  Resp: 15  Temp: (!) 96.9 F (36.1 C)  TempSrc: Tympanic  Weight: 168 lb (  76.2 kg)  Height: 5\' 5"  (1.651 m)   Physical Exam  Constitutional: She is oriented to person, place, and time and well-developed, well-nourished, and in no distress.  HENT:  Head: Normocephalic and atraumatic.  Eyes: EOM are normal. Pupils are equal, round, and reactive to light.  Neck: Normal range of motion.  Cardiovascular: Normal rate, regular rhythm and normal heart sounds.  Pulmonary/Chest: Effort normal and breath sounds normal.  Abdominal: Soft. Bowel sounds are normal.  Neurological: She is alert and oriented to person, place, and time.  Skin: Skin is warm and dry.   Breast exam was performed in seated and lying down position. Patient is status post right lumpectomy with a well-healed surgical scar. No evidence of any palpable masses. No evidence of axillary adenopathy. No evidence of any palpable masses or lumps in the left breast. No evidence of leftt axillary adenopathy     CMP Latest Ref Rng & Units 12/21/2016  Glucose 70 - 99  mg/dL 99  BUN 6 - 23 mg/dL 6  Creatinine 0.40 - 1.20 mg/dL 0.78  Sodium 135 - 145 mEq/L 140  Potassium 3.5 - 5.1 mEq/L 4.5  Chloride 96 - 112 mEq/L 104  CO2 19 - 32 mEq/L 31  Calcium 8.4 - 10.5 mg/dL 9.9  Total Protein 6.0 - 8.3 g/dL 7.2  Total Bilirubin 0.2 - 1.2 mg/dL 0.8  Alkaline Phos 39 - 117 U/L 78  AST 0 - 37 U/L 13  ALT 0 - 35 U/L 12   CBC Latest Ref Rng & Units 12/21/2016  WBC 4.0 - 10.5 K/uL 6.5  Hemoglobin 12.0 - 15.0 g/dL 12.9  Hematocrit 36.0 - 46.0 % 39.2  Platelets 150.0 - 400.0 K/uL 266.0     Assessment and plan- Patient is a 67 y.o. female with findings of atypical lobular hyperplasia in the right breast status post lumpectomy  Given that patient has atypical lobular hyperplasia, her relative risk of developing a subsequent breast cancer is 3.7-5.3 as compared to general population.  I also calculated her Baker Janus score based on her risk factors and a 5-year risk of developing breast cancer is 8.2% as compared to an average risk of 2.1% and lifetime risk of developing breast cancer is 26.4% as compared to 7.5% in the general population for her age.  Given these findings I would recommend chemoprevention with either tamoxifen or raloxifene for 5 years.  Discussed that there is no role for continuing treatment beyond 5 years.  Both tamoxifen and raloxifene can decrease the risk of subsequent breast cancer by about 7-9 for every thousand women.  Tamoxifen is better than raloxifene in preventing a subsequent breast cancer by about 4 cases every thousand women.  Both tamoxifen and raloxifene do not make a difference in overall survival or breast cancer specific mortality and both of them have a positive impact in the reduction of non-vertebral fractures.  There is an increased risk of thromboembolic events and endometrial cancer with tamoxifen about 4 cases every thousand women and raloxifene does not have these side effects.  Written information about both tamoxifen and  raloxifene has been handed over to the patient.  Patient would like to think about it and get back to [place] in 1 week's time.  If patient decides to pursue 1 of these treatments I will see her back in 6 weeks time to see how she is tolerating the medication.  Otherwise I will see her back in 6 months time for a routine breast exam.  Given  that her lifetime risk of developing breast cancer is greater than 20% she may even qualify for MRIs alternating with mammograms every 6 months if her insurance would approve it.  Moving forward she will need yearly mammograms as well as breast exams twice a year.  I have recommended that her 2 adult daughters should start getting their screening mammogram starting at the age of 63. Also counseled her regarding benefits of physical activity/ exercise and maintaining normal BMI to decrease risk of breast cancer in the future  Patient is in understanding of the plan   Thank you for this kind referral and the opportunity to participate in the care of this patient   Visit Diagnosis 1. Atypical lobular hyperplasia [facility]) of right breast     Dr. Randa Evens, MD, MPH [facility] Pager- [phone] 11/04/2017 12:06 PM

## 2017-11-07 ENCOUNTER — Telehealth: Payer: Self-pay | Admitting: *Deleted

## 2017-11-07 NOTE — Telephone Encounter (Signed)
Returned patient's call from today; no answer, left message on answering machine.       dhs

## 2017-11-11 ENCOUNTER — Telehealth: Payer: Self-pay

## 2017-11-11 NOTE — Telephone Encounter (Signed)
I think it is a good idea- her breast cancer risk is increased from normal and I would do it.  If side effects are intolerable she can always stop it  Worth a try

## 2017-11-11 NOTE — Telephone Encounter (Signed)
Copied from [place]. Topic: Inquiry >> Nov 11, 2017 10:50 AM Scherrie Gerlach wrote: Reason for CRM: pt saw an oncologist last Friday at [facility] and discussed the possibility of starting the 5 year pill (2 different ones) due to pt having the a typical cells that can cause breast cancer. Pt would like Dr Glori Bickers to review and advise pt on what she thinks she should do, considering pt's history. Some of the side effects did not sound very good, pt is leery.  Pt states all the notes are in Epic, if Dr Glori Bickers would not mind to look at what the oncologist advised and get back with her. Pt has a cpe on 4/20

## 2017-11-11 NOTE — Telephone Encounter (Signed)
Pt notified of Dr. Tower's comments and verbalized understanding  

## 2017-11-14 ENCOUNTER — Telehealth: Payer: Self-pay | Admitting: *Deleted

## 2017-11-14 NOTE — Telephone Encounter (Signed)
Pt called with questions and she wanted to ask questions. Her first question was what scale Dr. Janese Banks used to giver her percentages of her risk of breast cancer in the future. I told her it was GAIL scale.  She understood average risk vs her risk because she has hyperplasia.  She also spoke about the side effects of the drugs and that her sister had breast cancer and was on tamoxifen and had horrible side effects. I told her that everyone is different and can experience different side effects.  She knows that.  She wants to wait til she sees her PCP in April and discuss with her as well as base line labs and will let me know what she has decided about starting prevention medication of tamoxifen,and raloxifene. Dr Janese Banks notified of her decision above.

## 2017-12-05 ENCOUNTER — Other Ambulatory Visit: Payer: Self-pay | Admitting: Family Medicine

## 2017-12-05 NOTE — Telephone Encounter (Signed)
CPE scheduled on 12/27/17, last filled on 05/18/17 #30 tabs with 3 additional refill, please advise

## 2017-12-21 ENCOUNTER — Telehealth: Payer: Self-pay | Admitting: Family Medicine

## 2017-12-21 DIAGNOSIS — Z0000 Encounter for general adult medical examination without abnormal findings: Secondary | ICD-10-CM

## 2017-12-21 DIAGNOSIS — E559 Vitamin D deficiency, unspecified: Secondary | ICD-10-CM

## 2017-12-21 DIAGNOSIS — E785 Hyperlipidemia, unspecified: Secondary | ICD-10-CM

## 2017-12-21 NOTE — Telephone Encounter (Signed)
-----   Message from Eustace Pen, LPN sent at 2/41/9914  4:56 PM EDT ----- Regarding: Labs 4/25 Lab orders needed. Thank you.  Insurance:  [employer]

## 2017-12-22 ENCOUNTER — Ambulatory Visit (INDEPENDENT_AMBULATORY_CARE_PROVIDER_SITE_OTHER): Payer: Medicare HMO

## 2017-12-22 ENCOUNTER — Ambulatory Visit: Payer: Medicare HMO

## 2017-12-22 ENCOUNTER — Ambulatory Visit: Payer: Medicare HMO | Admitting: Family Medicine

## 2017-12-22 ENCOUNTER — Encounter: Payer: Self-pay | Admitting: Family Medicine

## 2017-12-22 VITALS — BP 100/60 | HR 59 | Temp 98.5°F | Ht 64.5 in | Wt 168.0 lb

## 2017-12-22 DIAGNOSIS — J209 Acute bronchitis, unspecified: Secondary | ICD-10-CM

## 2017-12-22 DIAGNOSIS — E559 Vitamin D deficiency, unspecified: Secondary | ICD-10-CM

## 2017-12-22 DIAGNOSIS — E785 Hyperlipidemia, unspecified: Secondary | ICD-10-CM | POA: Diagnosis not present

## 2017-12-22 DIAGNOSIS — Z0000 Encounter for general adult medical examination without abnormal findings: Secondary | ICD-10-CM | POA: Diagnosis not present

## 2017-12-22 DIAGNOSIS — E2839 Other primary ovarian failure: Secondary | ICD-10-CM | POA: Diagnosis not present

## 2017-12-22 LAB — LIPID PANEL
Cholesterol: 193 mg/dL (ref 0–200)
HDL: 69.4 mg/dL (ref >39.00)
LDL Cholesterol: 102 mg/dL — ABNORMAL HIGH (ref 0–99)
NONHDL: 123.1
Total CHOL/HDL Ratio: 3
Triglycerides: 106 mg/dL (ref 0.0–149.0)
VLDL: 21.2 mg/dL (ref 0.0–40.0)

## 2017-12-22 LAB — COMPREHENSIVE METABOLIC PANEL
ALT: 10 U/L (ref 0–35)
AST: 11 U/L (ref 0–37)
Albumin: 4 g/dL (ref 3.5–5.2)
Alkaline Phosphatase: 76 U/L (ref 39–117)
BUN: 8 mg/dL (ref 6–23)
CHLORIDE: 106 meq/L (ref 96–112)
CO2: 29 meq/L (ref 19–32)
Calcium: 9.6 mg/dL (ref 8.4–10.5)
Creatinine, Ser: 0.77 mg/dL (ref 0.40–1.20)
GFR: 79.68 mL/min (ref >60.00)
Glucose, Bld: 95 mg/dL (ref 70–99)
POTASSIUM: 4.5 meq/L (ref 3.5–5.1)
SODIUM: 141 meq/L (ref 135–145)
Total Bilirubin: 0.7 mg/dL (ref 0.2–1.2)
Total Protein: 7 g/dL (ref 6.0–8.3)

## 2017-12-22 LAB — CBC WITH DIFFERENTIAL/PLATELET
BASOS PCT: 0.5 % (ref 0.0–3.0)
Basophils Absolute: 0 10*3/uL (ref 0.0–0.1)
EOS PCT: 12.9 % — AB (ref 0.0–5.0)
Eosinophils Absolute: 0.8 10*3/uL — ABNORMAL HIGH (ref 0.0–0.7)
HCT: 36.8 % (ref 36.0–46.0)
Hemoglobin: 12.2 g/dL (ref 12.0–15.0)
LYMPHS ABS: 2 10*3/uL (ref 0.7–4.0)
Lymphocytes Relative: 32.7 % (ref 12.0–46.0)
MCHC: 33.2 g/dL (ref 30.0–36.0)
MCV: 82.5 fl (ref 78.0–100.0)
MONO ABS: 0.3 10*3/uL (ref 0.1–1.0)
Monocytes Relative: 5.5 % (ref 3.0–12.0)
NEUTROS PCT: 48.4 % (ref 43.0–77.0)
Neutro Abs: 3 10*3/uL (ref 1.4–7.7)
PLATELETS: 271 10*3/uL (ref 150.0–400.0)
RBC: 4.46 Mil/uL (ref 3.87–5.11)
RDW: 14 % (ref 11.5–15.5)
WBC: 6.2 10*3/uL (ref 4.0–10.5)

## 2017-12-22 LAB — TSH: TSH: 1.75 u[IU]/mL (ref 0.35–4.50)

## 2017-12-22 LAB — VITAMIN D 25 HYDROXY (VIT D DEFICIENCY, FRACTURES): VITD: 26.03 ng/mL — ABNORMAL LOW (ref 30.00–100.00)

## 2017-12-22 MED ORDER — PREDNISONE 10 MG PO TABS: ORAL_TABLET | ORAL | 0 refills | Status: DC

## 2017-12-22 MED ORDER — DOXYCYCLINE HYCLATE 100 MG PO TABS: 100.0000 mg | ORAL_TABLET | Freq: Two times a day (BID) | ORAL | 0 refills | Status: DC

## 2017-12-22 MED ORDER — BENZONATATE 200 MG PO CAPS: 200.0000 mg | ORAL_CAPSULE | Freq: Three times a day (TID) | ORAL | 1 refills | Status: DC | PRN

## 2017-12-22 NOTE — Patient Instructions (Signed)
Jenny Mcfarland , Thank you for taking time to come for your Medicare Wellness Visit. I appreciate your ongoing commitment to your health goals. Please review the following plan we discussed and let me know if I can assist you in the future.   These are the goals we discussed: Goals    . Increase physical activity     Starting 12/22/2017, I will continue to walk at least 20 minutes 3 days per week.        This is a list of the screening recommended for you and due dates:  Health Maintenance  Topic Date Due  . DEXA scan (bone density measurement)  08/29/2018*  . Pneumonia vaccines (2 of 2 - PPSV23) 12/28/2018*  .  Hepatitis C: One time screening is recommended by Center for Disease Control  (CDC) for  adults born from [birth year] through 1964.   10/01/2020*  . Flu Shot  03/30/2018  . Mammogram  07/15/2018  . Colon Cancer Screening  03/12/2021  . Tetanus Vaccine  05/24/2021  *Topic was postponed. The date shown is not the original due date.   Preventive Care for Adults  A healthy lifestyle and preventive care can promote health and wellness. Preventive health guidelines for adults include the following key practices.  . A routine yearly physical is a good way to check with your health care provider about your health and preventive screening. It is a chance to share any concerns and updates on your health and to receive a thorough exam.  . Visit your dentist for a routine exam and preventive care every 6 months. Brush your teeth twice a day and floss once a day. Good oral hygiene prevents tooth decay and gum disease.  . The frequency of eye exams is based on your age, health, family medical history, use  of contact lenses, and other factors. Follow your health care provider's recommendations for frequency of eye exams.  . Eat a healthy diet. Foods like vegetables, fruits, whole grains, low-fat dairy products, and lean protein foods contain the nutrients you need without too many calories. Decrease  your intake of foods high in solid fats, added sugars, and salt. Eat the right amount of calories for you. Get information about a proper diet from your health care provider, if necessary.  . Regular physical exercise is one of the most important things you can do for your health. Most adults should get at least 150 minutes of moderate-intensity exercise (any activity that increases your heart rate and causes you to sweat) each week. In addition, most adults need muscle-strengthening exercises on 2 or more days a week.  Silver Sneakers may be a benefit available to you. To determine eligibility, you may visit the website: www.silversneakers.com or contact program at (303)364-3972 Mon-Fri between 8AM-8PM.   . Maintain a healthy weight. The body mass index (BMI) is a screening tool to identify possible weight problems. It provides an estimate of body fat based on height and weight. Your health care provider can find your BMI and can help you achieve or maintain a healthy weight.   For adults 20 years and older: ? A BMI below 18.5 is considered underweight. ? A BMI of 18.5 to 24.9 is normal. ? A BMI of 25 to 29.9 is considered overweight. ? A BMI of 30 and above is considered obese.   . Maintain normal blood lipids and cholesterol levels by exercising and minimizing your intake of saturated fat. Eat a balanced diet with plenty of fruit and  vegetables. Blood tests for lipids and cholesterol should begin at age 13 and be repeated every 5 years. If your lipid or cholesterol levels are high, you are over 50, or you are at high risk for heart disease, you may need your cholesterol levels checked more frequently. Ongoing high lipid and cholesterol levels should be treated with medicines if diet and exercise are not working.  . If you smoke, find out from your health care provider how to quit. If you do not use tobacco, please do not start.  . If you choose to drink alcohol, please do not consume more than  2 drinks per day. One drink is considered to be 12 ounces (355 mL) of beer, 5 ounces (148 mL) of wine, or 1.5 ounces (44 mL) of liquor.  . If you are 54-1 years old, ask your health care provider if you should take aspirin to prevent strokes.  . Use sunscreen. Apply sunscreen liberally and repeatedly throughout the day. You should seek shade when your shadow is shorter than you. Protect yourself by wearing long sleeves, pants, a wide-brimmed hat, and sunglasses year round, whenever you are outdoors.  . Once a month, do a whole body skin exam, using a mirror to look at the skin on your back. Tell your health care provider of new moles, moles that have irregular borders, moles that are larger than a pencil eraser, or moles that have changed in shape or color.

## 2017-12-22 NOTE — Progress Notes (Signed)
Subjective:    Patient ID: Jenny Mcfarland, female    DOB: 07-02-1951, 67 y.o.   MRN: [id number]  HPI Here for symptom of chest congestion   Started 3 months ago -just not getting better  Caught uri from grandkids -- 3 times  Started with a bad st and low grade temp   Lots of PND - also with pollen   Still coughing  Hears rattling  Prod of phlegm- clear/yellow/ brown at times   Asthma- wheezing is fairly well controlled with advair  Has not needed rescue inhaler more than 3 times per week with this uri   Day quil mucinex   Wt Readings from Last 3 Encounters:  12/22/17 168 lb (76.2 kg)  12/22/17 168 lb (76.2 kg)  11/04/17 168 lb (76.2 kg)   Patient Active Problem List   Diagnosis Date Noted  . Acute bronchitis 12/22/2017  . Pre-ulcerative corn or callous 06/17/2017  . Welcome to Medicare preventive visit 12/21/2016  . Estrogen deficiency 06/04/2015  . Vitamin D deficiency 09/23/2014  . Thoracic or lumbosacral neuritis or radiculitis, unspecified 05/21/2013  . Gynecological examination 05/25/2011  . Other screening mammogram 05/25/2011  . Routine general medical examination at a health care facility 05/25/2011  . Osteopenia 12/27/2010  . IBS (irritable bowel syndrome) 12/25/2010  . Allergic rhinitis 12/25/2010  . Asthma 12/25/2010  . Mild hyperlipidemia 12/25/2010  . Family history of breast cancer 12/25/2010  . Family history of aortic aneurysm 12/25/2010  . GERD 11/08/2008  . BARRETT'S ESOPHAGUS 11/08/2008   Past Medical History:  Diagnosis Date  . Allergic rhinitis   . Asthma    WELL CONTROLLED  . Barrett's esophagus 2005  . Complication of anesthesia   . GERD (gastroesophageal reflux disease) 11/2008  . Hemorrhoids   . Hiatal hernia   . History of chicken pox   . IBS (irritable bowel syndrome)   . Osteopenia   . Peptic stricture of esophagus 11-2008  . PONV (postoperative nausea and vomiting)    N/V WITH GALLBLADDER  . Recurrent sinusitis     Past Surgical History:  Procedure Laterality Date  . BREAST BIOPSY Right 07/26/2017   INTRADUCTAL PAPILLOMA.   Marland Kitchen BREAST LUMPECTOMY WITH NEEDLE LOCALIZATION Right 09/09/2017   Procedure: BREAST LUMPECTOMY WITH NEEDLE LOCALIZATION;  Surgeon: Leonie Green, MD;  Location: [facility] ORS;  Service: General;  Laterality: Right;  . CHOLECYSTECTOMY  2010  . TUBAL LIGATION     Social History   Tobacco Use  . Smoking status: Never Smoker  . Smokeless tobacco: Never Used  Substance Use Topics  . Alcohol use: Yes    Alcohol/week: 0.0 oz    Comment: RARE  . Drug use: No   Family History  Problem Relation Age of Onset  . Aortic aneurysm Mother   . Heart disease Mother   . Heart attack Brother        early 67's  . Arthritis Brother   . Breast cancer Sister 65  . Colon cancer Neg Hx    Allergies  Allergen Reactions  . Levofloxacin Itching and Other (See Comments)    Felt like she had bugs crawling all over her.   Current Outpatient Medications on File Prior to Visit  Medication Sig Dispense Refill  . Cholecalciferol (VITAMIN D3 PO) Take 1 capsule by mouth 3 (three) times a week.    . fexofenadine (ALLEGRA) 180 MG tablet Take 180 mg by mouth every morning.     . Fluticasone-Salmeterol (ADVAIR DISKUS) 250-50  MCG/DOSE AEPB Inhale 1 puff into the lungs 2 (two) times daily as needed. (Patient taking differently: Inhale 1 puff into the lungs 2 (two) times daily. ) 60 each 11  . lansoprazole (PREVACID SOLUTAB) 30 MG disintegrating tablet Take 1 tablet (30 mg total) by mouth 2 (two) times daily. 60 tablet 11  . levalbuterol (XOPENEX HFA) 45 MCG/ACT inhaler Inhale 1 puff into the lungs every 4 (four) hours as needed for wheezing.    Marland Kitchen zolpidem (AMBIEN) 5 MG tablet TAKE 1/2 TO 1 (ONE-HALF TO ONE) TABLET BY MOUTH AT BEDTIME AS NEEDED 30 tablet 3   No current facility-administered medications on file prior to visit.     Review of Systems  Constitutional: Positive for appetite change and  fatigue. Negative for fever.  HENT: Positive for congestion, postnasal drip, rhinorrhea, sinus pressure, sneezing and sore throat. Negative for ear pain.   Eyes: Negative for pain and discharge.  Respiratory: Positive for cough. Negative for shortness of breath, wheezing and stridor.   Cardiovascular: Negative for chest pain.  Gastrointestinal: Negative for diarrhea, nausea and vomiting.  Genitourinary: Negative for frequency, hematuria and urgency.  Musculoskeletal: Negative for arthralgias and myalgias.  Skin: Negative for rash.  Neurological: Positive for headaches. Negative for dizziness, weakness and light-headedness.  Psychiatric/Behavioral: Negative for confusion and dysphoric mood.       Objective:   Physical Exam  Constitutional: She appears well-developed and well-nourished. No distress.  obese and well appearing   HENT:  Head: Normocephalic and atraumatic.  Right Ear: External ear normal.  Left Ear: External ear normal.  Mouth/Throat: Oropharynx is clear and moist.  Nares are injected and congested  No sinus tenderness Clear rhinorrhea and post nasal drip   Eyes: Pupils are equal, round, and reactive to light. Conjunctivae and EOM are normal. Right eye exhibits no discharge. Left eye exhibits no discharge.  Neck: Normal range of motion. Neck supple.  Cardiovascular: Normal rate and normal heart sounds.  Pulmonary/Chest: Effort normal and breath sounds normal. No respiratory distress. She has no wheezes. She has no rales. She exhibits no tenderness.  Harsh bs with scattered rhonchi  Good air exch No rales   Lymphadenopathy:    She has no cervical adenopathy.  Neurological: She is alert.  Skin: Skin is warm and dry. No rash noted.  Psychiatric: She has a normal mood and affect.          Assessment & Plan:   Problem List Items Addressed This Visit      Respiratory   Acute bronchitis    After several uris over 3 mo  Hx of asthma-some reactive airways Cover  with doxycycline  Prednisone 20 mg for inflammation/wheeze Continue inhalers Tessalon for cough  Update if not starting to improve in a week or if worsening   Has appt next week -will re check

## 2017-12-22 NOTE — Assessment & Plan Note (Signed)
After several uris over 3 mo  Hx of asthma-some reactive airways Cover with doxycycline  Prednisone 20 mg for inflammation/wheeze Continue inhalers Tessalon for cough  Update if not starting to improve in a week or if worsening   Has appt next week -will re check

## 2017-12-22 NOTE — Patient Instructions (Signed)
Drink fluids Try to get extra rest   Take the doxycycline and prednisone for bronchitis Tessalon for cough   Update if not starting to improve in a week or if worsening

## 2017-12-22 NOTE — Progress Notes (Signed)
PCP notes:   Health maintenance:  Bone density - order generated PPSV23 - postponed/pt ill today  Abnormal screenings:   None  Patient concerns:   Patient has respiratory concerns. Pt has same day appt with PCP.  Nurse concerns:  None  Next PCP appt:   12/27/17 @ 1130  I reviewed health advisor's note, was available for consultation, and agree with documentation and plan. Loura Pardon MD

## 2017-12-22 NOTE — Progress Notes (Signed)
Subjective:   Jenny Mcfarland is a 67 y.o. female who presents for Medicare Annual (Subsequent) preventive examination.  Review of Systems:  N/A Cardiac Risk Factors include: advanced age (>53men, >43 women);dyslipidemia     Objective:     Vitals: BP 100/60 (BP Location: Right Arm, Patient Position: Sitting, Cuff Size: Normal)   Pulse (!) 59   Temp 98.5 F (36.9 C) (Oral)   Ht 5' 4.5" (1.638 m) Comment: no shoes  Wt 168 lb (76.2 kg)   SpO2 98%   BMI 28.39 kg/m   Body mass index is 28.39 kg/m.  Advanced Directives 12/22/2017 11/04/2017 09/09/2017 09/02/2017  Does Patient Have a Medical Advance Directive? Yes Yes Yes Yes  Type of Advance Directive Living will;Healthcare Power of [place];Living will [place];Living will -  Does patient want to make changes to medical advance directive? - - Yes (MAU/Ambulatory/Procedural Areas - Information given) -  Copy of [place] in Chart? No - copy requested Yes Yes -    Tobacco Social History   Tobacco Use  Smoking Status Never Smoker  Smokeless Tobacco Never Used     Counseling given: No   Clinical Intake:  Pre-visit preparation completed: Yes  Pain : No/denies pain Pain Score: 0-No pain     Nutritional Status: BMI 25 -29 Overweight Nutritional Risks: None Diabetes: No  How often do you need to have someone help you when you read instructions, pamphlets, or other written materials from your doctor or pharmacy?: 1 - Never What is the last grade level you completed in school?: 12th grade  Interpreter Needed?: No  Comments: pt is separated and lives alone Information entered by :: LPinson, LPN  Past Medical History:  Diagnosis Date  . Allergic rhinitis   . Asthma    WELL CONTROLLED  . Barrett's esophagus 2005  . Complication of anesthesia   . GERD (gastroesophageal reflux disease) 11/2008  . Hemorrhoids   . Hiatal hernia   . History of chicken  pox   . IBS (irritable bowel syndrome)   . Osteopenia   . Peptic stricture of esophagus 11-2008  . PONV (postoperative nausea and vomiting)    N/V WITH GALLBLADDER  . Recurrent sinusitis    Past Surgical History:  Procedure Laterality Date  . BREAST BIOPSY Right 07/26/2017   INTRADUCTAL PAPILLOMA.   Marland Kitchen BREAST LUMPECTOMY WITH NEEDLE LOCALIZATION Right 09/09/2017   Procedure: BREAST LUMPECTOMY WITH NEEDLE LOCALIZATION;  Surgeon: Leonie Green, MD;  Location: [facility] ORS;  Service: General;  Laterality: Right;  . CHOLECYSTECTOMY  2010  . TUBAL LIGATION     Family History  Problem Relation Age of Onset  . Aortic aneurysm Mother   . Heart disease Mother   . Heart attack Brother        early 49's  . Arthritis Brother   . Breast cancer Sister 26  . Colon cancer Neg Hx    Social History   Socioeconomic History  . Marital status: Single    Spouse name: Not on file  . Number of children: 2  . Years of education: Not on file  . Highest education level: Not on file  Occupational History  . Occupation: Research scientist (physical sciences) for [employer]  Social Needs  . Financial resource strain: Not on file  . Food insecurity:    Worry: Not on file    Inability: Not on file  . Transportation needs:    Medical: Not on file  Non-medical: Not on file  Tobacco Use  . Smoking status: Never Smoker  . Smokeless tobacco: Never Used  Substance and Sexual Activity  . Alcohol use: Yes    Alcohol/week: 0.0 oz    Comment: RARE  . Drug use: No  . Sexual activity: Not Currently  Lifestyle  . Physical activity:    Days per week: Not on file    Minutes per session: Not on file  . Stress: Not on file  Relationships  . Social connections:    Talks on phone: Not on file    Gets together: Not on file    Attends religious service: Not on file    Active member of club or organization: Not on file    Attends meetings of clubs or organizations: Not on file    Relationship status: Not on file  Other Topics  Concern  . Not on file  Social History Narrative   Daily caffeine use - soft drinks/tea       Separating from husband 1/17    Outpatient Encounter Medications as of 12/22/2017  Medication Sig  . Cholecalciferol (VITAMIN D3 PO) Take 1 capsule by mouth 3 (three) times a week.  . fexofenadine (ALLEGRA) 180 MG tablet Take 180 mg by mouth every morning.   . Fluticasone-Salmeterol (ADVAIR DISKUS) 250-50 MCG/DOSE AEPB Inhale 1 puff into the lungs 2 (two) times daily as needed. (Patient taking differently: Inhale 1 puff into the lungs 2 (two) times daily. )  . lansoprazole (PREVACID SOLUTAB) 30 MG disintegrating tablet Take 1 tablet (30 mg total) by mouth 2 (two) times daily.  Marland Kitchen levalbuterol (XOPENEX HFA) 45 MCG/ACT inhaler Inhale 1 puff into the lungs every 4 (four) hours as needed for wheezing.  Marland Kitchen zolpidem (AMBIEN) 5 MG tablet TAKE 1/2 TO 1 (ONE-HALF TO ONE) TABLET BY MOUTH AT BEDTIME AS NEEDED   No facility-administered encounter medications on file as of 12/22/2017.     Activities of Daily Living In your present state of health, do you have any difficulty performing the following activities: 12/22/2017 09/02/2017  Hearing? N N  Vision? N N  Difficulty concentrating or making decisions? N N  Walking or climbing stairs? N N  Dressing or bathing? N N  Doing errands, shopping? N N  Preparing Food and eating ? N -  Using the Toilet? N -  In the past six months, have you accidently leaked urine? N -  Do you have problems with loss of bowel control? N -  Managing your Medications? N -  Managing your Finances? N -  Housekeeping or managing your Housekeeping? N -  Some recent data might be hidden    Patient Care Team: Tower, [name], MD as PCP - General (Family Medicine)    Assessment:   This is a routine wellness examination for [name].   Hearing Screening   125Hz  250Hz  500Hz  1000Hz  2000Hz  3000Hz  4000Hz  6000Hz  8000Hz   Right ear:   40 40 40  40    Left ear:   40 40 40  40      Visual  Acuity Screening   Right eye Left eye Both eyes  Without correction: 20/70-1 20/40 20/40  With correction:       Exercise Activities and Dietary recommendations Current Exercise Habits: Home exercise routine, Type of exercise: walking;Other - see comments(yardwork), Time (Minutes): 20, Frequency (Times/Week): 3, Weekly Exercise (Minutes/Week): 60, Intensity: Mild  Goals    . Increase physical activity     Starting 12/22/2017, I  will continue to walk at least 20 minutes 3 days per week.        Fall Risk Fall Risk  12/22/2017 06/17/2017 12/21/2016  Falls in the past year? No No No   Depression Screen PHQ 2/9 Scores 12/22/2017 06/17/2017 12/21/2016 09/12/2012  PHQ - 2 Score 0 0 0 0  PHQ- 9 Score 0 - - -     Cognitive Function MMSE - Mini Mental State Exam 12/22/2017  Orientation to time 5  Orientation to Place 5  Registration 3  Attention/ Calculation 0  Recall 3  Language- name 2 objects 0  Language- repeat 1  Language- follow 3 step command 3  Language- read & follow direction 0  Write a sentence 0  Copy design 0  Total score 20     PLEASE NOTE: A Mini-Cog screen was completed. Maximum score is 20. A value of 0 denotes this part of Folstein MMSE was not completed or the patient failed this part of the Mini-Cog screening.   Mini-Cog Screening Orientation to Time - Max 5 pts Orientation to Place - Max 5 pts Registration - Max 3 pts Recall - Max 3 pts Language Repeat - Max 1 pts Language Follow 3 Step Command - Max 3 pts'    Immunization History  Administered Date(s) Administered  . Influenza Split 06/21/2012  . Influenza,inj,Quad PF,6+ Mos 06/13/2013, 07/01/2014, 06/16/2015, 06/25/2016, 06/17/2017  . Pneumococcal Conjugate-13 12/21/2016  . Tdap 05/25/2011    Screening Tests Health Maintenance  Topic Date Due  . DEXA SCAN  08/29/2018 (Originally 07/02/2017)  . PNA vac Low Risk Adult (2 of 2 - PPSV23) 12/28/2018 (Originally 12/21/2017)  . Hepatitis C Screening   10/01/2020 (Originally [birth year])  . INFLUENZA VACCINE  03/30/2018  . MAMMOGRAM  07/15/2018  . COLONOSCOPY  03/12/2021  . TETANUS/TDAP  05/24/2021        Plan:     I have personally reviewed, addressed, and noted the following in the patient's chart:  A. Medical and social history B. Use of alcohol, tobacco or illicit drugs  C. Current medications and supplements D. Functional ability and status E.  Nutritional status F.  Physical activity G. Advance directives H. List of other physicians I.  Hospitalizations, surgeries, and ER visits in previous 12 months J.  [place] to include hearing, vision, cognitive, depression L. Referrals and appointments - none  In addition, I have reviewed and discussed with patient certain preventive protocols, quality metrics, and best practice recommendations. A written personalized care plan for preventive services as well as general preventive health recommendations were provided to patient.  See attached scanned questionnaire for additional information.   Signed,   Lindell Noe, MHA, BS, LPN Health Coach

## 2017-12-27 ENCOUNTER — Ambulatory Visit (INDEPENDENT_AMBULATORY_CARE_PROVIDER_SITE_OTHER): Payer: Medicare HMO | Admitting: Family Medicine

## 2017-12-27 ENCOUNTER — Encounter: Payer: Self-pay | Admitting: Family Medicine

## 2017-12-27 VITALS — BP 130/80 | HR 58 | Temp 98.3°F | Ht 64.5 in | Wt 168.5 lb

## 2017-12-27 DIAGNOSIS — Z0000 Encounter for general adult medical examination without abnormal findings: Secondary | ICD-10-CM

## 2017-12-27 DIAGNOSIS — E785 Hyperlipidemia, unspecified: Secondary | ICD-10-CM

## 2017-12-27 DIAGNOSIS — E559 Vitamin D deficiency, unspecified: Secondary | ICD-10-CM | POA: Diagnosis not present

## 2017-12-27 DIAGNOSIS — M8580 Other specified disorders of bone density and structure, unspecified site: Secondary | ICD-10-CM | POA: Diagnosis not present

## 2017-12-27 NOTE — Progress Notes (Signed)
Subjective:    Patient ID: Jenny Mcfarland, female    DOB: 1951-06-09, 67 y.o.   MRN: [id number]  HPI Here for health maintenance exam and to review chronic medical problems    Recent bronchitis-tx with doxycycline and prednisone  Much improved now  Did not tolerate tessalon - esophageal side eff   Very busy- ball season for grandkids for next 6-8 weeks She loves it   Wt Readings from Last 3 Encounters:  12/27/17 168 lb 8 oz (76.4 kg)  12/22/17 168 lb (76.2 kg)  12/22/17 168 lb (76.2 kg)  wt is stable  Taking care of herself  Exercises more in warm weather- yard work / walking  Eating healthy also- tries hard most of the time  28.48 kg/m   Had amw on 4/25 Order done for dexa (needs to schedule) - wants to do it in the fall  Postponed [id number] due to illness   (pt was then seen for bronchitis)  Mammogram 11/18-followed by a biopsy (intraductal papilloma)- ok  Self breast exam -no lumps or changes   Colonoscopy 7/17- 5 year recall  Sees GI in June for her esophageal issues - doing ok overall   bp BP Readings from Last 3 Encounters:  12/27/17 140/68  12/22/17 100/60  12/22/17 100/60     Hyperlipidemia Lab Results  Component Value Date   CHOL 193 12/22/2017   CHOL 205 (H) 12/21/2016   CHOL 187 09/29/2015   Lab Results  Component Value Date   HDL 69.40 12/22/2017   HDL 77.00 12/21/2016   HDL 67.60 09/29/2015   Lab Results  Component Value Date   LDLCALC 102 (H) 12/22/2017   LDLCALC 108 (H) 12/21/2016   LDLCALC 106 (H) 09/29/2015   Lab Results  Component Value Date   TRIG 106.0 12/22/2017   TRIG 101.0 12/21/2016   TRIG 67.0 09/29/2015   Lab Results  Component Value Date   CHOLHDL 3 12/22/2017   CHOLHDL 3 12/21/2016   CHOLHDL 3 09/29/2015   Lab Results  Component Value Date   LDLDIRECT 135.4 09/17/2013   LDLDIRECT 125.5 09/11/2012   LDLDIRECT 121.7 05/25/2011  no red meat or fried food  Pretty well controlled with diet    Other  labs Results for orders placed or performed in visit on 12/22/17  VITAMIN D 25 Hydroxy (Vit-D Deficiency, Fractures)  Result Value Ref Range   VITD 26.03 (L) 30.00 - 100.00 ng/mL  TSH  Result Value Ref Range   TSH 1.75 0.35 - 4.50 uIU/mL  Lipid panel  Result Value Ref Range   Cholesterol 193 0 - 200 mg/dL   Triglycerides 106.0 0.0 - 149.0 mg/dL   HDL 69.40 >39.00 mg/dL   VLDL 21.2 0.0 - 40.0 mg/dL   LDL Cholesterol 102 (H) 0 - 99 mg/dL   Total CHOL/HDL Ratio 3    NonHDL 123.10   CBC with Differential/Platelet  Result Value Ref Range   WBC 6.2 4.0 - 10.5 K/uL   RBC 4.46 3.87 - 5.11 Mil/uL   Hemoglobin 12.2 12.0 - 15.0 g/dL   HCT 36.8 36.0 - 46.0 %   MCV 82.5 78.0 - 100.0 fl   MCHC 33.2 30.0 - 36.0 g/dL   RDW 14.0 11.5 - 15.5 %   Platelets 271.0 150.0 - 400.0 K/uL   Neutrophils Relative % 48.4 43.0 - 77.0 %   Lymphocytes Relative 32.7 12.0 - 46.0 %   Monocytes Relative 5.5 3.0 - 12.0 %   Eosinophils Relative 12.9 (H)  0.0 - 5.0 %   Basophils Relative 0.5 0.0 - 3.0 %   Neutro Abs 3.0 1.4 - 7.7 K/uL   Lymphs Abs 2.0 0.7 - 4.0 K/uL   Monocytes Absolute 0.3 0.1 - 1.0 K/uL   Eosinophils Absolute 0.8 (H) 0.0 - 0.7 K/uL   Basophils Absolute 0.0 0.0 - 0.1 K/uL  Comprehensive metabolic panel  Result Value Ref Range   Sodium 141 135 - 145 mEq/L   Potassium 4.5 3.5 - 5.1 mEq/L   Chloride 106 96 - 112 mEq/L   CO2 29 19 - 32 mEq/L   Glucose, Bld 95 70 - 99 mg/dL   BUN 8 6 - 23 mg/dL   Creatinine, Ser 0.77 0.40 - 1.20 mg/dL   Total Bilirubin 0.7 0.2 - 1.2 mg/dL   Alkaline Phosphatase 76 39 - 117 U/L   AST 11 0 - 37 U/L   ALT 10 0 - 35 U/L   Total Protein 7.0 6.0 - 8.3 g/dL   Albumin 4.0 3.5 - 5.2 g/dL   Calcium 9.6 8.4 - 10.5 mg/dL   GFR 79.68 >60.00 mL/min     Patient Active Problem List   Diagnosis Date Noted  . Acute bronchitis 12/22/2017  . Pre-ulcerative corn or callous 06/17/2017  . Welcome to Medicare preventive visit 12/21/2016  . Estrogen deficiency 06/04/2015  .  Vitamin D deficiency 09/23/2014  . Thoracic or lumbosacral neuritis or radiculitis, unspecified 05/21/2013  . Gynecological examination 05/25/2011  . Other screening mammogram 05/25/2011  . Routine general medical examination at a health care facility 05/25/2011  . Osteopenia 12/27/2010  . IBS (irritable bowel syndrome) 12/25/2010  . Allergic rhinitis 12/25/2010  . Asthma 12/25/2010  . Mild hyperlipidemia 12/25/2010  . Family history of breast cancer 12/25/2010  . Family history of aortic aneurysm 12/25/2010  . GERD 11/08/2008  . BARRETT'S ESOPHAGUS 11/08/2008   Past Medical History:  Diagnosis Date  . Allergic rhinitis   . Asthma    WELL CONTROLLED  . Barrett's esophagus 2005  . Complication of anesthesia   . GERD (gastroesophageal reflux disease) 11/2008  . Hemorrhoids   . Hiatal hernia   . History of chicken pox   . IBS (irritable bowel syndrome)   . Osteopenia   . Peptic stricture of esophagus 11-2008  . PONV (postoperative nausea and vomiting)    N/V WITH GALLBLADDER  . Recurrent sinusitis    Past Surgical History:  Procedure Laterality Date  . BREAST BIOPSY Right 07/26/2017   INTRADUCTAL PAPILLOMA.   Marland Kitchen BREAST LUMPECTOMY WITH NEEDLE LOCALIZATION Right 09/09/2017   Procedure: BREAST LUMPECTOMY WITH NEEDLE LOCALIZATION;  Surgeon: Leonie Green, MD;  Location: [facility] ORS;  Service: General;  Laterality: Right;  . CHOLECYSTECTOMY  2010  . TUBAL LIGATION     Social History   Tobacco Use  . Smoking status: Never Smoker  . Smokeless tobacco: Never Used  Substance Use Topics  . Alcohol use: Yes    Alcohol/week: 0.0 oz    Comment: RARE  . Drug use: No   Family History  Problem Relation Age of Onset  . Aortic aneurysm Mother   . Heart disease Mother   . Heart attack Brother        early 101's  . Arthritis Brother   . Breast cancer Sister 58  . Colon cancer Neg Hx    Allergies  Allergen Reactions  . Levofloxacin Itching and Other (See Comments)    Felt  like she had bugs crawling all over her.  Marland Kitchen  Tessalon [Benzonatate] Nausea And Vomiting    Made GERD worse   Current Outpatient Medications on File Prior to Visit  Medication Sig Dispense Refill  . Cholecalciferol (VITAMIN D3 PO) Take 1 capsule by mouth 3 (three) times a week.    . doxycycline (VIBRA-TABS) 100 MG tablet Take 1 tablet (100 mg total) by mouth 2 (two) times daily. 14 tablet 0  . fexofenadine (ALLEGRA) 180 MG tablet Take 180 mg by mouth every morning.     . Fluticasone-Salmeterol (ADVAIR DISKUS) 250-50 MCG/DOSE AEPB Inhale 1 puff into the lungs 2 (two) times daily as needed. (Patient taking differently: Inhale 1 puff into the lungs 2 (two) times daily. ) 60 each 11  . lansoprazole (PREVACID SOLUTAB) 30 MG disintegrating tablet Take 1 tablet (30 mg total) by mouth 2 (two) times daily. 60 tablet 11  . levalbuterol (XOPENEX HFA) 45 MCG/ACT inhaler Inhale 1 puff into the lungs every 4 (four) hours as needed for wheezing.    . predniSONE (DELTASONE) 10 MG tablet Take 2 pills by mouth once daily for 3 days then 1 pill once daily for 3 days 9 tablet 0  . zolpidem (AMBIEN) 5 MG tablet TAKE 1/2 TO 1 (ONE-HALF TO ONE) TABLET BY MOUTH AT BEDTIME AS NEEDED 30 tablet 3   No current facility-administered medications on file prior to visit.     Review of Systems  Constitutional: Negative for activity change, appetite change, fatigue, fever and unexpected weight change.  HENT: Negative for congestion, ear pain, rhinorrhea, sinus pressure and sore throat.   Eyes: Negative for pain, redness and visual disturbance.  Respiratory: Positive for cough. Negative for shortness of breath and wheezing.        Improved cough    Cardiovascular: Negative for chest pain and palpitations.  Gastrointestinal: Negative for abdominal pain, blood in stool, constipation and diarrhea.  Endocrine: Negative for polydipsia and polyuria.  Genitourinary: Negative for dysuria, frequency and urgency.  Musculoskeletal:  Negative for arthralgias, back pain and myalgias.  Skin: Negative for pallor and rash.  Allergic/Immunologic: Negative for environmental allergies.  Neurological: Negative for dizziness, syncope and headaches.  Hematological: Negative for adenopathy. Does not bruise/bleed easily.  Psychiatric/Behavioral: Negative for decreased concentration and dysphoric mood. The patient is not nervous/anxious.        Objective:   Physical Exam  Constitutional: She appears well-developed and well-nourished. No distress.  overwt and well app  HENT:  Head: Normocephalic and atraumatic.  Right Ear: External ear normal.  Left Ear: External ear normal.  Mouth/Throat: Oropharynx is clear and moist.  Eyes: Pupils are equal, round, and reactive to light. Conjunctivae and EOM are normal. No scleral icterus.  Neck: Normal range of motion. Neck supple. No JVD present. Carotid bruit is not present. No thyromegaly present.  Cardiovascular: Normal rate, regular rhythm, normal heart sounds and intact distal pulses. Exam reveals no gallop.  Pulmonary/Chest: Effort normal and breath sounds normal. No respiratory distress. She has no wheezes. She exhibits no tenderness. No breast tenderness, discharge or bleeding.  Good air exch No rales or rhonchi  Abdominal: Soft. Bowel sounds are normal. She exhibits no distension, no abdominal bruit and no mass. There is no tenderness.  Genitourinary: No breast tenderness, discharge or bleeding.  Genitourinary Comments: Breast exam: No mass, nodules, thickening, tenderness, bulging, retraction, inflamation, nipple discharge or skin changes noted.  No axillary or clavicular LA.     Healed scar near R nipple   Musculoskeletal: Normal range of motion. She exhibits no edema  or tenderness.  Lymphadenopathy:    She has no cervical adenopathy.  Neurological: She is alert. She has normal reflexes. No cranial nerve deficit. She exhibits normal muscle tone. Coordination normal.  Skin: Skin  is warm and dry. No rash noted. No erythema. No pallor.  Solar lentigines diffusely   Psychiatric: She has a normal mood and affect.  Pleasant           Assessment & Plan:   Problem List Items Addressed This Visit      Musculoskeletal and Integument   Osteopenia    Will schedule dexa for fall with mammogram  Disc need for calcium/ vitamin D/ wt bearing exercise and bone density test every 2 y to monitor Disc safety/ fracture risk in detail   D is low- plan to inc D to 2000 iu daily  Also exercise No falls or fx        Other   Mild hyperlipidemia    Disc goals for lipids and reasons to control them Rev labs with pt Rev low sat fat diet in detail Well controlled with diet       Routine general medical examination at a health care facility - Primary    Reviewed health habits including diet and exercise and skin cancer prevention Reviewed appropriate screening tests for age  Also reviewed health mt list, fam hx and immunization status , as well as social and family history   See HPI Labs rev amw rev  Disc shingrix  incr vit D3 to 2000 iu daily Plan dexa for fall  Return for PPSV 23 when done with prednisone       Vitamin D deficiency    Disc imp to bone and overall health  Inc dose to 2000 iu daily

## 2017-12-27 NOTE — Patient Instructions (Addendum)
Call and make a nurse appointment for a pneumonia shot in the summer   Increase your vitamin D to 2000 iu per day  Your vitamin D is low   Stay active Take care of yourself

## 2017-12-27 NOTE — Assessment & Plan Note (Signed)
Disc imp to bone and overall health  Inc dose to 2000 iu daily

## 2017-12-27 NOTE — Assessment & Plan Note (Signed)
Reviewed health habits including diet and exercise and skin cancer prevention Reviewed appropriate screening tests for age  Also reviewed health mt list, fam hx and immunization status , as well as social and family history   See HPI Labs rev amw rev  Disc shingrix  incr vit D3 to 2000 iu daily Plan dexa for fall  Return for PPSV 23 when done with prednisone

## 2017-12-27 NOTE — Assessment & Plan Note (Signed)
Disc goals for lipids and reasons to control them Rev labs with pt Rev low sat fat diet in detail Well controlled with diet

## 2017-12-27 NOTE — Assessment & Plan Note (Signed)
Will schedule dexa for fall with mammogram  Disc need for calcium/ vitamin D/ wt bearing exercise and bone density test every 2 y to monitor Disc safety/ fracture risk in detail   D is low- plan to inc D to 2000 iu daily  Also exercise No falls or fx

## 2017-12-30 ENCOUNTER — Other Ambulatory Visit: Payer: Self-pay | Admitting: Family Medicine

## 2017-12-30 DIAGNOSIS — Z1231 Encounter for screening mammogram for malignant neoplasm of breast: Secondary | ICD-10-CM

## 2018-01-30 ENCOUNTER — Telehealth: Payer: Self-pay | Admitting: Family Medicine

## 2018-01-30 NOTE — Telephone Encounter (Signed)
Copied from [place] [id number]. Topic: Quick Communication - See Telephone Encounter >> Jan 30, 2018  2:05 PM Conception Chancy, NT wrote: CRM for notification. See Telephone encounter for: 01/30/18.  Patient states she has another upper respiratory infection and would like to know can she get a refill on doxycycline (VIBRA-TABS) 100 MG tablet  and predniSONE (DELTASONE) 10 MG tablet. Please advise.  [facility] [address] [place] [address] Phone: [phone] Fax: [phone]

## 2018-02-01 ENCOUNTER — Ambulatory Visit: Payer: Self-pay | Admitting: *Deleted

## 2018-02-01 ENCOUNTER — Ambulatory Visit: Payer: Medicare HMO | Admitting: Gastroenterology

## 2018-02-01 ENCOUNTER — Encounter: Payer: Self-pay | Admitting: Gastroenterology

## 2018-02-01 ENCOUNTER — Ambulatory Visit (INDEPENDENT_AMBULATORY_CARE_PROVIDER_SITE_OTHER): Payer: Medicare HMO | Admitting: Family Medicine

## 2018-02-01 ENCOUNTER — Encounter: Payer: Self-pay | Admitting: Family Medicine

## 2018-02-01 ENCOUNTER — Other Ambulatory Visit: Payer: Self-pay

## 2018-02-01 VITALS — BP 116/60 | HR 80 | Ht 64.5 in | Wt 168.0 lb

## 2018-02-01 VITALS — BP 120/60 | HR 70 | Temp 98.6°F | Ht 64.5 in | Wt 168.5 lb

## 2018-02-01 DIAGNOSIS — K2270 Barrett's esophagus without dysplasia: Secondary | ICD-10-CM

## 2018-02-01 DIAGNOSIS — J209 Acute bronchitis, unspecified: Secondary | ICD-10-CM | POA: Diagnosis not present

## 2018-02-01 DIAGNOSIS — J4531 Mild persistent asthma with (acute) exacerbation: Secondary | ICD-10-CM

## 2018-02-01 DIAGNOSIS — Z86010 Personal history of colonic polyps: Secondary | ICD-10-CM | POA: Diagnosis not present

## 2018-02-01 MED ORDER — AZITHROMYCIN 250 MG PO TABS: ORAL_TABLET | ORAL | 0 refills | Status: DC

## 2018-02-01 MED ORDER — LANSOPRAZOLE 30 MG PO TBDP: 30.0000 mg | ORAL_TABLET | Freq: Two times a day (BID) | ORAL | 11 refills | Status: DC

## 2018-02-01 MED ORDER — PREDNISONE 10 MG PO TABS
ORAL_TABLET | ORAL | 0 refills | Status: DC
Start: 2018-02-01 — End: 2018-04-03

## 2018-02-01 NOTE — Telephone Encounter (Signed)
Pt has appt Dr Lorelei Pont 02/01/18 at 12 noon. Dr Glori Bickers out of office.

## 2018-02-01 NOTE — Telephone Encounter (Signed)
Patient is calling to request refill of medication- she is having a reoccurrence of cough and chest congestion. Appointment scheduled.

## 2018-02-01 NOTE — Telephone Encounter (Incomplete)
  Reason for Disposition . [1] Continuous (nonstop) coughing interferes with work or school AND [2] no improvement using cough treatment per Care Advice  Answer Assessment - Initial Assessment Questions 1. ONSET: "When did the cough begin?"      1 1/2 weeks- started 2. SEVERITY: "How bad is the cough today?"      Coughing almost every other breath worse in morning- uses inhaler- gets better at lunchtime- then gets worse I the evening 3. RESPIRATORY DISTRESS: "Describe your breathing."      Patient is breathing deeper in the mornings- her chest fills heavy- she has chest pain from coughing so much 4. FEVER: "Do you have a fever?" If so, ask: "What is your temperature, how was it measured, and when did it start?"     Fever at the beginning- no fever in a week 5. SPUTUM: "Describe the color of your sputum" (clear, white, yellow, green)     Sometimes clear- to yellow/brownish 6. HEMOPTYSIS: "Are you coughing up any blood?" If so ask: "How much?" (flecks, streaks, tablespoons, etc.)     No blood 7. CARDIAC HISTORY: "Do you have any history of heart disease?" (e.g., heart attack, congestive heart failure)      no 8. LUNG HISTORY: "Do you have any history of lung disease?"  (e.g., pulmonary embolus, asthma, emphysema)     asthma 9. PE RISK FACTORS: "Do you have a history of blood clots?" (or: recent major surgery, recent prolonged travel, bedridden )     no 10. OTHER SYMPTOMS: "Do you have any other symptoms?" (e.g., runny nose, wheezing, chest pain)       Heaviness in chest 11. PREGNANCY: "Is there any chance you are pregnant?" "When was your last menstrual period?"       n/a 12. TRAVEL: "Have you traveled out of the country in the last month?" (e.g., travel history, exposures)       n/a  Protocols used: [place]

## 2018-02-01 NOTE — Telephone Encounter (Signed)
  Answer Assessment - Initial Assessment Questions 1. ONSET: "When did the cough begin?"      1 1/2 weeks- started 2. SEVERITY: "How bad is the cough today?"      Coughing almost every other breath worse in morning- uses inhaler- gets better at lunchtime- then gets worse I the evening 3. RESPIRATORY DISTRESS: "Describe your breathing."      Patient is breathing deeper in the mornings- her chest fills heavy- she has chest pain from coughing so much 4. FEVER: "Do you have a fever?" If so, ask: "What is your temperature, how was it measured, and when did it start?"     Fever at the beginning- no fever in a week 5. SPUTUM: "Describe the color of your sputum" (clear, white, yellow, green)     Sometimes clear- to yellow/brownish 6. HEMOPTYSIS: "Are you coughing up any blood?" If so ask: "How much?" (flecks, streaks, tablespoons, etc.)     No blood 7. CARDIAC HISTORY: "Do you have any history of heart disease?" (e.g., heart attack, congestive heart failure)      no 8. LUNG HISTORY: "Do you have any history of lung disease?"  (e.g., pulmonary embolus, asthma, emphysema)     asthma 9. PE RISK FACTORS: "Do you have a history of blood clots?" (or: recent major surgery, recent prolonged travel, bedridden )     no 10. OTHER SYMPTOMS: "Do you have any other symptoms?" (e.g., runny nose, wheezing, chest pain)       Heaviness in chest 11. PREGNANCY: "Is there any chance you are pregnant?" "When was your last menstrual period?"       n/a 12. TRAVEL: "Have you traveled out of the country in the last month?" (e.g., travel history, exposures)       n/a  Protocols used: [place]

## 2018-02-01 NOTE — Telephone Encounter (Signed)
Patient has been triaged and she has been scheduled for appointment.

## 2018-02-01 NOTE — Progress Notes (Signed)
Dr. Frederico Hamman T. Mayuri Staples, MD, [facility] [place], [address] Phone: [phone] Fax: [phone]  02/01/2018  Patient: Jenny Mcfarland, MRN: [id number], DOB: [birth year], 67 y.o.  Primary Physician:  Tower, [name], MD   Chief Complaint  Patient presents with  . Cough    x 2 weeks   Subjective:   Jenny Mcfarland is a 67 y.o. very pleasant female patient who presents with the following:  Had another illness and in the spring. Drainage and down into her chest.   She is a pleasant lady, and she has a history of asthma.  Generally this is well controlled, but she has had some increased cough over the last 2 weeks.  Initially she started to have some runny nose and drainage went down into her throat and ultimately this is transitioned into her chest.  She now has a cough is productive of significant sputum.  She has been having to take her Xopenex over the last few days.  Past Medical History, Surgical History, Social History, Family History, Problem List, Medications, and Allergies have been reviewed and updated if relevant.  Patient Active Problem List   Diagnosis Date Noted  . Welcome to Medicare preventive visit 12/21/2016  . Estrogen deficiency 06/04/2015  . Vitamin D deficiency 09/23/2014  . Thoracic or lumbosacral neuritis or radiculitis, unspecified 05/21/2013  . Gynecological examination 05/25/2011  . Other screening mammogram 05/25/2011  . Routine general medical examination at a health care facility 05/25/2011  . Osteopenia 12/27/2010  . IBS (irritable bowel syndrome) 12/25/2010  . Allergic rhinitis 12/25/2010  . Asthma 12/25/2010  . Mild hyperlipidemia 12/25/2010  . Family history of breast cancer 12/25/2010  . Family history of aortic aneurysm 12/25/2010  . GERD 11/08/2008  . BARRETT'S ESOPHAGUS 11/08/2008    Past Medical History:  Diagnosis Date  . Allergic rhinitis   . Asthma    WELL  CONTROLLED  . Barrett's esophagus 2005  . Complication of anesthesia   . GERD (gastroesophageal reflux disease) 11/2008  . Hemorrhoids   . Hiatal hernia   . History of chicken pox   . IBS (irritable bowel syndrome)   . Osteopenia   . Peptic stricture of esophagus 11-2008  . PONV (postoperative nausea and vomiting)    N/V WITH GALLBLADDER  . Recurrent sinusitis     Past Surgical History:  Procedure Laterality Date  . BREAST BIOPSY Right 07/26/2017   INTRADUCTAL PAPILLOMA.   Marland Kitchen BREAST LUMPECTOMY WITH NEEDLE LOCALIZATION Right 09/09/2017   Procedure: BREAST LUMPECTOMY WITH NEEDLE LOCALIZATION;  Surgeon: Leonie Green, MD;  Location: [facility] ORS;  Service: General;  Laterality: Right;  . CHOLECYSTECTOMY  2010  . TUBAL LIGATION      Social History   Socioeconomic History  . Marital status: Single    Spouse name: Not on file  . Number of children: 2  . Years of education: Not on file  . Highest education level: Not on file  Occupational History  . Occupation: Research scientist (physical sciences) for [employer]  Social Needs  . Financial resource strain: Not on file  . Food insecurity:    Worry: Not on file    Inability: Not on file  . Transportation needs:    Medical: Not on file    Non-medical: Not on file  Tobacco Use  . Smoking status: Never Smoker  . Smokeless tobacco: Never Used  Substance and Sexual Activity  . Alcohol use: Yes  Alcohol/week: 0.0 oz    Comment: RARE  . Drug use: No  . Sexual activity: Not Currently  Lifestyle  . Physical activity:    Days per week: Not on file    Minutes per session: Not on file  . Stress: Not on file  Relationships  . Social connections:    Talks on phone: Not on file    Gets together: Not on file    Attends religious service: Not on file    Active member of club or organization: Not on file    Attends meetings of clubs or organizations: Not on file    Relationship status: Not on file  . Intimate partner violence:    Fear of current or  ex partner: Not on file    Emotionally abused: Not on file    Physically abused: Not on file    Forced sexual activity: Not on file  Other Topics Concern  . Not on file  Social History Narrative   Daily caffeine use - soft drinks/tea       Separating from husband 1/17    Family History  Problem Relation Age of Onset  . Aortic aneurysm Mother   . Heart disease Mother   . Heart attack Brother        early 35's  . Arthritis Brother   . Breast cancer Sister 64  . Colon cancer Neg Hx     Allergies  Allergen Reactions  . Levofloxacin Itching and Other (See Comments)    Felt like she had bugs crawling all over her.  Lavella Lemons [Benzonatate] Nausea And Vomiting    Made GERD worse    Medication list reviewed and updated in full in [facility].  ROS: GEN: Acute illness details above GI: Tolerating PO intake GU: maintaining adequate hydration and urination Pulm: No SOB Interactive and getting along well at home.  Otherwise, ROS is as per the HPI.  Objective:   BP 120/60   Pulse 70   Temp 98.6 F (37 C) (Oral)   Ht 5' 4.5" (1.638 m)   Wt 168 lb 8 oz (76.4 kg)   SpO2 98%   BMI 28.48 kg/m    GEN: A and O x 3. WDWN. NAD.    ENT: Nose clear, ext NML.  No LAD.  No JVD.  TM's clear. Oropharynx clear.  PULM: Normal WOB, no distress. No crackles, wheezes, rhonchi. CV: RRR, no M/G/R, No rubs, No JVD.   EXT: warm and well-perfused, No c/c/e. PSYCH: Pleasant and conversant.    Laboratory and Imaging Data:  Assessment and Plan:   Asthma exacerbation, non-allergic, mild persistent  Acute bronchitis, unspecified organism  Fluids, supportive care  Follow-up: No follow-ups on file.  Meds ordered this encounter  Medications  . azithromycin (ZITHROMAX) 250 MG tablet    Sig: 2 tabs po on day 1, then 1 tab po for 4 days    Dispense:  6 tablet    Refill:  0  . predniSONE (DELTASONE) 10 MG tablet    Sig: 3 tabs po for 2 days, then 2 tabs po for 2 days, then 1 tab  po for 2 days    Dispense:  12 tablet    Refill:  0   Signed,  Rasool Rommel T. Alvah Lagrow, MD   Allergies as of 02/01/2018      Reactions   Levofloxacin Itching, Other (See Comments)   Felt like she had bugs crawling all over her.   Tessalon [benzonatate] Nausea And  Vomiting   Made GERD worse      Medication List        Accurate as of 02/01/18  1:48 PM. Always use your most recent med list.          azithromycin 250 MG tablet Commonly known as:  ZITHROMAX 2 tabs po on day 1, then 1 tab po for 4 days   fexofenadine 180 MG tablet Commonly known as:  ALLEGRA Take 180 mg by mouth every morning.   Fluticasone-Salmeterol 250-50 MCG/DOSE Aepb Commonly known as:  ADVAIR DISKUS Inhale 1 puff into the lungs 2 (two) times daily as needed.   lansoprazole 30 MG disintegrating tablet Commonly known as:  PREVACID SOLUTAB Take 1 tablet (30 mg total) by mouth 2 (two) times daily.   levalbuterol 45 MCG/ACT inhaler Commonly known as:  XOPENEX HFA Inhale 1 puff into the lungs every 4 (four) hours as needed for wheezing.   predniSONE 10 MG tablet Commonly known as:  DELTASONE 3 tabs po for 2 days, then 2 tabs po for 2 days, then 1 tab po for 2 days   VITAMIN D3 PO Take 1 capsule by mouth 3 (three) times a week.   zolpidem 5 MG tablet Commonly known as:  AMBIEN TAKE 1/2 TO 1 (ONE-HALF TO ONE) TABLET BY MOUTH AT BEDTIME AS NEEDED

## 2018-02-01 NOTE — Patient Instructions (Signed)
We have sent the following medications to your pharmacy for you to pick up at your convenience: Prevacid.   Thank you for choosing me and [facility].  Pricilla Riffle. Dagoberto Ligas., MD., [facility]

## 2018-02-01 NOTE — Telephone Encounter (Signed)
Patient reports she has the exact same thing she had in April and she is requesting treatment. Mornings are really bad- patient is using inhaler and allergy medication without relief- she is coughing so much that she has pain. Constant tickle in her throat. Patient states she had low grade temperature when it first started about a 10 days ago. Patient states she got better the last treatment.

## 2018-02-01 NOTE — Progress Notes (Addendum)
    History of Present Illness: This is a 67 year old female with GERD and a history of Barrett's. EGD in 2010 showed Barrett's however past 2 EGDs Barrett's has not been noted on pathology.  Her reflux symptoms are under excellent control.  No gastrointestinal complaints.  EGD 02/2016 - LA Grade B chronic esophagitis. Rule out Barrett's esophagus. Biopsied. - Multiple gastric polyps. Biopsied. - Medium-sized hiatal hernia. - Normal duodenal bulb and second portion of the duodenum.  Current Medications, Allergies, Past Medical History, Past Surgical History, Family History and Social History were reviewed in [employer] record.  Physical Exam: General: Well developed, well nourished, no acute distress Head: Normocephalic and atraumatic Eyes:  sclerae anicteric, EOMI Ears: Normal auditory acuity Mouth: No deformity or lesions Lungs: Clear throughout to auscultation Heart: Regular rate and rhythm; no murmurs, rubs or bruits Abdomen: Soft, non tender and non distended. No masses, hepatosplenomegaly or hernias noted. Normal Bowel sounds Musculoskeletal: Symmetrical with no gross deformities  Pulses:  Normal pulses noted Extremities: No clubbing, cyanosis, edema or deformities noted Neurological: Alert oriented x 4, grossly nonfocal Psychological:  Alert and cooperative. Normal mood and affect  Assessment and Recommendations:  1. GERD with a history of Barrett's esophagus.  However last 2 endoscopies have not shown clear indications of Barrett's endoscopically and biopsies have not shown Barrett's.  Surveillance EGD recommended in July 2022.  Continue Prevacid 30 mg Solutab twice daily and follow standard antireflux measures.  2. Personal history of adenomatous colon polyps.  A 5-year interval surveillance colonoscopy is recommended July 2022.

## 2018-02-14 ENCOUNTER — Ambulatory Visit: Payer: Medicare HMO

## 2018-02-16 DIAGNOSIS — Z83511 Family history of glaucoma: Secondary | ICD-10-CM | POA: Diagnosis not present

## 2018-02-16 DIAGNOSIS — H5203 Hypermetropia, bilateral: Secondary | ICD-10-CM | POA: Diagnosis not present

## 2018-02-16 DIAGNOSIS — H524 Presbyopia: Secondary | ICD-10-CM | POA: Diagnosis not present

## 2018-02-16 DIAGNOSIS — H2513 Age-related nuclear cataract, bilateral: Secondary | ICD-10-CM | POA: Diagnosis not present

## 2018-04-03 ENCOUNTER — Encounter: Payer: Self-pay | Admitting: Family Medicine

## 2018-04-03 ENCOUNTER — Ambulatory Visit (INDEPENDENT_AMBULATORY_CARE_PROVIDER_SITE_OTHER): Payer: Medicare HMO | Admitting: Family Medicine

## 2018-04-03 VITALS — BP 130/80 | HR 62 | Temp 98.3°F | Ht 64.5 in | Wt 167.2 lb

## 2018-04-03 DIAGNOSIS — M5442 Lumbago with sciatica, left side: Secondary | ICD-10-CM

## 2018-04-03 DIAGNOSIS — B070 Plantar wart: Secondary | ICD-10-CM | POA: Diagnosis not present

## 2018-04-03 DIAGNOSIS — M545 Low back pain, unspecified: Secondary | ICD-10-CM | POA: Insufficient documentation

## 2018-04-03 MED ORDER — MELOXICAM 15 MG PO TABS: 15.0000 mg | ORAL_TABLET | Freq: Every day | ORAL | 1 refills | Status: DC

## 2018-04-03 NOTE — Assessment & Plan Note (Signed)
With radiation to L leg  Suspect spasm with sciatic nerve inv  Reassuring exam -no neuro red flags Ref to PT meloxicam 15 mg daily with food Heat/ice Walking  Update if not starting to improve in a week (or with PT) or if worsening

## 2018-04-03 NOTE — Patient Instructions (Signed)
I think you have a sciatic nerve problem from your back Keep walking  We will refer you to PT  Take the meloxicam as directed with food   Use a wart treatment on foot

## 2018-04-03 NOTE — Assessment & Plan Note (Signed)
2 small plantar warts on surface of foot debrided with #11 scalpel Well tolerated Enc to use wart pads (continue) otc  Clean with soap and water

## 2018-04-03 NOTE — Progress Notes (Signed)
Subjective:    Patient ID: Jenny Mcfarland, female    DOB: November 10, 1950, 67 y.o.   MRN: [id number]  HPI  Here with muscle spasm in L leg Over a week   10 days ago-ache in L low back/buttock Then traveled down front of leg down to top of foot  occ throbs  Very bothersome  Has not changed her activities   Had mowed and raked   Last night much worse after lying on L side - that really exacerbated it  Had trouble sleeping  Getting up to walk helped   occ aches in knee area  Sitting is worse than walking   No numbness or weakness in foot or leg   Wt Readings from Last 3 Encounters:  04/03/18 167 lb 4 oz (75.9 kg)  02/01/18 168 lb 8 oz (76.4 kg)  02/01/18 168 lb (76.2 kg)   28.27 kg/m   Lab Results  Component Value Date   CREATININE 0.77 12/22/2017   BUN 8 12/22/2017   NA 141 12/22/2017   K 4.5 12/22/2017   CL 106 12/22/2017   CO2 29 12/22/2017   Spot on bottom of foot -? Wart   She took 2 advil this am -may have helped   Patient Active Problem List   Diagnosis Date Noted  . Low back pain 04/03/2018  . Plantar wart of left foot 04/03/2018  . Welcome to Medicare preventive visit 12/21/2016  . Estrogen deficiency 06/04/2015  . Vitamin D deficiency 09/23/2014  . Thoracic or lumbosacral neuritis or radiculitis, unspecified 05/21/2013  . Gynecological examination 05/25/2011  . Other screening mammogram 05/25/2011  . Routine general medical examination at a health care facility 05/25/2011  . Osteopenia 12/27/2010  . IBS (irritable bowel syndrome) 12/25/2010  . Allergic rhinitis 12/25/2010  . Asthma 12/25/2010  . Mild hyperlipidemia 12/25/2010  . Family history of breast cancer 12/25/2010  . Family history of aortic aneurysm 12/25/2010  . GERD 11/08/2008  . BARRETT'S ESOPHAGUS 11/08/2008   Past Medical History:  Diagnosis Date  . Allergic rhinitis   . Asthma    WELL CONTROLLED  . Barrett's esophagus 2005  . Complication of anesthesia   . GERD  (gastroesophageal reflux disease) 11/2008  . Hemorrhoids   . Hiatal hernia   . History of chicken pox   . IBS (irritable bowel syndrome)   . Osteopenia   . Peptic stricture of esophagus 11-2008  . PONV (postoperative nausea and vomiting)    N/V WITH GALLBLADDER  . Recurrent sinusitis    Past Surgical History:  Procedure Laterality Date  . BREAST BIOPSY Right 07/26/2017   INTRADUCTAL PAPILLOMA.   Marland Kitchen BREAST LUMPECTOMY WITH NEEDLE LOCALIZATION Right 09/09/2017   Procedure: BREAST LUMPECTOMY WITH NEEDLE LOCALIZATION;  Surgeon: Leonie Green, MD;  Location: [facility] ORS;  Service: General;  Laterality: Right;  . CHOLECYSTECTOMY  2010  . TUBAL LIGATION     Social History   Tobacco Use  . Smoking status: Never Smoker  . Smokeless tobacco: Never Used  Substance Use Topics  . Alcohol use: Yes    Alcohol/week: 0.0 oz    Comment: RARE  . Drug use: No   Family History  Problem Relation Age of Onset  . Aortic aneurysm Mother   . Heart disease Mother   . Heart attack Brother        early 71's  . Arthritis Brother   . Breast cancer Sister 70  . Colon cancer Neg Hx    Allergies  Allergen Reactions  . Levofloxacin Itching and Other (See Comments)    Felt like she had bugs crawling all over her.  Lavella Lemons [Benzonatate] Nausea And Vomiting    Made GERD worse   Current Outpatient Medications on File Prior to Visit  Medication Sig Dispense Refill  . Cholecalciferol (VITAMIN D3 PO) Take 1 capsule by mouth 3 (three) times a week.    . fexofenadine (ALLEGRA) 180 MG tablet Take 180 mg by mouth every morning.     . Fluticasone-Salmeterol (ADVAIR DISKUS) 250-50 MCG/DOSE AEPB Inhale 1 puff into the lungs 2 (two) times daily as needed. (Patient taking differently: Inhale 1 puff into the lungs 2 (two) times daily. ) 60 each 11  . lansoprazole (PREVACID SOLUTAB) 30 MG disintegrating tablet Take 1 tablet (30 mg total) by mouth 2 (two) times daily. 60 tablet 11  . levalbuterol (XOPENEX HFA) 45  MCG/ACT inhaler Inhale 1 puff into the lungs every 4 (four) hours as needed for wheezing.    Marland Kitchen zolpidem (AMBIEN) 5 MG tablet TAKE 1/2 TO 1 (ONE-HALF TO ONE) TABLET BY MOUTH AT BEDTIME AS NEEDED 30 tablet 3   No current facility-administered medications on file prior to visit.      Review of Systems  Constitutional: Negative for activity change, appetite change, fatigue, fever and unexpected weight change.  HENT: Negative for congestion, ear pain, rhinorrhea, sinus pressure and sore throat.   Eyes: Negative for pain, redness and visual disturbance.  Respiratory: Negative for cough, shortness of breath and wheezing.   Cardiovascular: Negative for chest pain and palpitations.  Gastrointestinal: Negative for abdominal pain, blood in stool, constipation and diarrhea.  Endocrine: Negative for polydipsia and polyuria.  Genitourinary: Negative for dysuria, frequency and urgency.  Musculoskeletal: Positive for back pain. Negative for arthralgias and myalgias.       Low back and L leg pain   Skin: Negative for pallor and rash.       Plantar warts   Allergic/Immunologic: Negative for environmental allergies.  Neurological: Negative for dizziness, syncope and headaches.  Hematological: Negative for adenopathy. Does not bruise/bleed easily.  Psychiatric/Behavioral: Negative for decreased concentration and dysphoric mood. The patient is not nervous/anxious.        Objective:   Physical Exam  Constitutional: She appears well-developed and well-nourished. No distress.  overwt and well appearing   HENT:  Head: Normocephalic and atraumatic.  Eyes: Pupils are equal, round, and reactive to light. Conjunctivae and EOM are normal. No scleral icterus.  Neck: Normal range of motion. Neck supple.  Cardiovascular: Normal rate and regular rhythm.  Pulmonary/Chest: Effort normal and breath sounds normal. She has no wheezes. She has no rales.  Abdominal: Soft. Bowel sounds are normal. She exhibits no  distension. There is no tenderness.  Musculoskeletal: She exhibits tenderness. She exhibits no edema or deformity.       Right shoulder: She exhibits tenderness and bony tenderness. She exhibits normal range of motion, no swelling, no crepitus, no deformity, normal pulse and normal strength.       Lumbar back: She exhibits decreased range of motion, tenderness and spasm. She exhibits no bony tenderness and no edema.  Pain on extension Flex and lateral bend are ok  Nl gait  Tender over L3-L4 L5 area and in the L lumbar musculature No piriformis tenderness Nl rom of hips  Some mild L greater troch tenderness  Pos slr for L leg pain   Lymphadenopathy:    She has no cervical adenopathy.  Neurological: She  is alert. She has normal strength and normal reflexes. She displays no atrophy. No cranial nerve deficit or sensory deficit. She exhibits normal muscle tone. Coordination normal.  Pos SLR- pain in L leg and top of foot   Skin: Skin is warm and dry. No rash noted. No erythema. No pallor.  Plantar warts-2 small on L foot  Cleaned and debrided /dead skin    Psychiatric: She has a normal mood and affect.  Pleasant           Assessment & Plan:   Problem List Items Addressed This Visit      Musculoskeletal and Integument   Plantar wart of left foot    2 small plantar warts on surface of foot debrided with #11 scalpel Well tolerated Enc to use wart pads (continue) otc  Clean with soap and water         Other   Low back pain - Primary    With radiation to L leg  Suspect spasm with sciatic nerve inv  Reassuring exam -no neuro red flags Ref to PT meloxicam 15 mg daily with food Heat/ice Walking  Update if not starting to improve in a week (or with PT) or if worsening        Relevant Medications   meloxicam (MOBIC) 15 MG tablet   Other Relevant Orders   Ambulatory referral to Physical Therapy

## 2018-04-06 ENCOUNTER — Telehealth: Payer: Self-pay | Admitting: Gastroenterology

## 2018-04-06 NOTE — Telephone Encounter (Signed)
Patient states her insurance company is now requiring a prior auth for medication prevacid solutab, for the 60 tablet supply. Patient requesting we get a prior auth and let her know if she needs to do anything.

## 2018-04-06 NOTE — Telephone Encounter (Signed)
Informed patient I initiated the PA for Prevacid solu-tab yesterday through cover my meds and are just waiting on a response from the insurance company. Informed patient I will contact her when they respond. Patient verbalized understanding.

## 2018-04-07 NOTE — Telephone Encounter (Signed)
Called insurance company to check on approval for PA. Insurance company states their is paid claim on this prescription and it has already been approved. They informed me [facility] may be running it for the generic and not the brand name. Called [facility] back and they ran the prescription has Prevacid brand name and it went through. Called patient to inform her she can pick up her prescription.

## 2018-04-10 DIAGNOSIS — M5430 Sciatica, unspecified side: Secondary | ICD-10-CM | POA: Diagnosis not present

## 2018-04-10 DIAGNOSIS — M545 Low back pain: Secondary | ICD-10-CM | POA: Diagnosis not present

## 2018-04-12 ENCOUNTER — Ambulatory Visit: Payer: Self-pay | Admitting: Family Medicine

## 2018-04-12 ENCOUNTER — Telehealth: Payer: Self-pay | Admitting: Family Medicine

## 2018-04-12 DIAGNOSIS — M5442 Lumbago with sciatica, left side: Secondary | ICD-10-CM

## 2018-04-12 MED ORDER — CYCLOBENZAPRINE HCL 10 MG PO TABS: 5.0000 mg | ORAL_TABLET | Freq: Three times a day (TID) | ORAL | 0 refills | Status: DC | PRN

## 2018-04-12 NOTE — Telephone Encounter (Signed)
Pt notified Rx sent to pharmacy and pt said she does want to see an ortho doc, she has one in mind but couldn't remember his name, pt said it's okay to put the referral in and when one of our PCCs calls to set up appt she will give them the name of the Ortho doc she want to try and see

## 2018-04-12 NOTE — Telephone Encounter (Signed)
Copied from [place] [id number]. Topic: Quick Communication - See Telephone Encounter >> Apr 12, 2018  3:14 PM Ivar Drape wrote: CRM for notification. See Telephone encounter for: 04/12/18. Patient called and was given the information Dr. Glori Bickers left and she stated she would call back tomorrow and let the office know if she wants a referral to an Ortho or not.

## 2018-04-12 NOTE — Telephone Encounter (Signed)
Referral done Will route to PCC Thanks  

## 2018-04-12 NOTE — Telephone Encounter (Signed)
See nurse triage note today.

## 2018-04-12 NOTE — Telephone Encounter (Signed)
It sounds like her back pain has become much worse   I will pend flexeril to her pharmacy   I also think we should refer to orthopedics Is she open to this ?  If so- does she pref [facility] or [facility]?   Thanks

## 2018-04-12 NOTE — Addendum Note (Signed)
Addended by: Loura Pardon A on: 04/12/2018 04:40 PM   Modules accepted: Orders

## 2018-04-12 NOTE — Telephone Encounter (Signed)
Pt stated back and left leg pain started toward the end of July. Pt stated that her pain starts 2 inches below the waist down her left leg to between her knee and ankle. Pt stated last night the pain began began to her posterior hip area then around knee cap and felt like a "knife."  Pt stated that she is only able to sit or lie down and go to the bathroom not much more. Pt stated she took "muscle relaxer." Did not see that she was prescribed a muscle relaxer in note, but was prescribed Meloxicam.  Pt stated she has tried heat, ice, 2 extra strength Tylenol, hot shower. Pt stated she was unable to sleep last night due to the pain. At 3 am she took 1/2 hydrocodone that she had left over form a lumpectomy from January. Pt stated after 30 - 45 minutes she slept for 3 hours. She stated this am the pain is back again.  Pt was seen by Dr. Glori Bickers on 04/03/18 and was referred to physical therapy. Pt stated she was given exercises to do but has been unable to do them  because of the pain.  Pt denies weakness, numbness or any problems with bowel or bladder control. Pt stated it would be very hard for her to come in for a visit. Pt is requesting something for the pain or a muscle relaxer to help.   Reason for Disposition . Back pain present > 2 weeks  Answer Assessment - Initial Assessment Questions 1. ONSET: "When did the pain begin?"      End of July 2. LOCATION: "Where does it hurt?" (upper, mid or lower back)     Pt stated that the pain starts 2 inches below the waist down leg to between knee and ankle. When walking the pain starts at the hip to the knee and ankle. Pt stated last night the pain began to her psoterior hip area then around knee cap and stated the pain feels like a knife.  3. SEVERITY: "How bad is the pain?"  (e.g., Scale 1-10; mild, moderate, or severe)   - MILD (1-3): doesn't interfere with normal activities    - MODERATE (4-7): interferes with normal activities or awakens from sleep    -  SEVERE (8-10): excruciating pain, unable to do any normal activities      Severe with activities. Moderate when not walking or sitting.  4. PATTERN: "Is the pain constant?" (e.g., yes, no; constant, intermittent)      Yesterday was constant- today the pain is is achy but the sharp pains when pt starts to walk 5. RADIATION: "Does the pain shoot into your legs or elsewhere?"     yes 6. CAUSE:  "What do you think is causing the back pain?"      Sciatica  7. BACK OVERUSE:  "Any recent lifting of heavy objects, strenuous work or exercise?"     no 8. MEDICATIONS: "What have you taken so far for the pain?" (e.g., nothing, acetaminophen, NSAIDS)     Muscle relaxers 2 extra strength Tylenol and 1/2 of hydrocodone (from a previous lumpectomy in January  9. NEUROLOGIC SYMPTOMS: "Do you have any weakness, numbness, or problems with bowel/bladder control?"     no 10. OTHER SYMPTOMS: "Do you have any other symptoms?" (e.g., fever, abdominal pain, burning with urination, blood in urine)       no 11. PREGNANCY: "Is there any chance you are pregnant?" (e.g., yes, no; LMP)  n/a  Protocols used: BACK PAIN-A-AH

## 2018-04-12 NOTE — Addendum Note (Signed)
Addended by: Loura Pardon A on: 04/12/2018 12:58 PM   Modules accepted: Orders

## 2018-04-13 NOTE — Telephone Encounter (Signed)
Appt made with Dr Sarina Ser on 04/19/18 at 12:45 and patient notified.

## 2018-04-21 DIAGNOSIS — M533 Sacrococcygeal disorders, not elsewhere classified: Secondary | ICD-10-CM | POA: Diagnosis not present

## 2018-04-21 DIAGNOSIS — M955 Acquired deformity of pelvis: Secondary | ICD-10-CM | POA: Diagnosis not present

## 2018-04-21 DIAGNOSIS — M5136 Other intervertebral disc degeneration, lumbar region: Secondary | ICD-10-CM | POA: Diagnosis not present

## 2018-04-21 DIAGNOSIS — M47816 Spondylosis without myelopathy or radiculopathy, lumbar region: Secondary | ICD-10-CM | POA: Diagnosis not present

## 2018-04-21 DIAGNOSIS — M5430 Sciatica, unspecified side: Secondary | ICD-10-CM | POA: Diagnosis not present

## 2018-04-21 DIAGNOSIS — M545 Low back pain: Secondary | ICD-10-CM | POA: Diagnosis not present

## 2018-04-21 DIAGNOSIS — M5442 Lumbago with sciatica, left side: Secondary | ICD-10-CM | POA: Diagnosis not present

## 2018-04-24 DIAGNOSIS — M5430 Sciatica, unspecified side: Secondary | ICD-10-CM | POA: Diagnosis not present

## 2018-04-24 DIAGNOSIS — M545 Low back pain: Secondary | ICD-10-CM | POA: Diagnosis not present

## 2018-04-26 DIAGNOSIS — M545 Low back pain: Secondary | ICD-10-CM | POA: Diagnosis not present

## 2018-04-26 DIAGNOSIS — M5430 Sciatica, unspecified side: Secondary | ICD-10-CM | POA: Diagnosis not present

## 2018-04-27 DIAGNOSIS — M955 Acquired deformity of pelvis: Secondary | ICD-10-CM | POA: Diagnosis not present

## 2018-04-27 DIAGNOSIS — M5136 Other intervertebral disc degeneration, lumbar region: Secondary | ICD-10-CM | POA: Diagnosis not present

## 2018-04-27 DIAGNOSIS — M47816 Spondylosis without myelopathy or radiculopathy, lumbar region: Secondary | ICD-10-CM | POA: Diagnosis not present

## 2018-04-27 DIAGNOSIS — M533 Sacrococcygeal disorders, not elsewhere classified: Secondary | ICD-10-CM | POA: Diagnosis not present

## 2018-04-27 DIAGNOSIS — M5442 Lumbago with sciatica, left side: Secondary | ICD-10-CM | POA: Diagnosis not present

## 2018-05-08 ENCOUNTER — Other Ambulatory Visit: Payer: Self-pay | Admitting: Sports Medicine

## 2018-05-08 DIAGNOSIS — M5442 Lumbago with sciatica, left side: Principal | ICD-10-CM

## 2018-05-08 DIAGNOSIS — M47816 Spondylosis without myelopathy or radiculopathy, lumbar region: Secondary | ICD-10-CM

## 2018-05-08 DIAGNOSIS — M5136 Other intervertebral disc degeneration, lumbar region: Secondary | ICD-10-CM

## 2018-05-08 DIAGNOSIS — G8929 Other chronic pain: Secondary | ICD-10-CM

## 2018-05-16 DIAGNOSIS — Z7951 Long term (current) use of inhaled steroids: Secondary | ICD-10-CM | POA: Diagnosis not present

## 2018-05-16 DIAGNOSIS — Z809 Family history of malignant neoplasm, unspecified: Secondary | ICD-10-CM | POA: Diagnosis not present

## 2018-05-16 DIAGNOSIS — K219 Gastro-esophageal reflux disease without esophagitis: Secondary | ICD-10-CM | POA: Diagnosis not present

## 2018-05-16 DIAGNOSIS — J45909 Unspecified asthma, uncomplicated: Secondary | ICD-10-CM | POA: Diagnosis not present

## 2018-05-16 DIAGNOSIS — Z791 Long term (current) use of non-steroidal anti-inflammatories (NSAID): Secondary | ICD-10-CM | POA: Diagnosis not present

## 2018-05-16 DIAGNOSIS — G4700 Insomnia, unspecified: Secondary | ICD-10-CM | POA: Diagnosis not present

## 2018-05-16 DIAGNOSIS — Z803 Family history of malignant neoplasm of breast: Secondary | ICD-10-CM | POA: Diagnosis not present

## 2018-05-16 DIAGNOSIS — M792 Neuralgia and neuritis, unspecified: Secondary | ICD-10-CM | POA: Diagnosis not present

## 2018-05-16 DIAGNOSIS — K2270 Barrett's esophagus without dysplasia: Secondary | ICD-10-CM | POA: Diagnosis not present

## 2018-05-16 DIAGNOSIS — Z8249 Family history of ischemic heart disease and other diseases of the circulatory system: Secondary | ICD-10-CM | POA: Diagnosis not present

## 2018-05-22 ENCOUNTER — Ambulatory Visit
Admission: RE | Admit: 2018-05-22 | Discharge: 2018-05-22 | Disposition: A | Discharge status: Home / Self Care | Payer: Medicare HMO | Source: Ambulatory Visit | Attending: Sports Medicine | Admitting: Sports Medicine

## 2018-05-22 DIAGNOSIS — M5126 Other intervertebral disc displacement, lumbar region: Secondary | ICD-10-CM | POA: Diagnosis not present

## 2018-05-22 DIAGNOSIS — M5442 Lumbago with sciatica, left side: Secondary | ICD-10-CM

## 2018-05-22 DIAGNOSIS — M5136 Other intervertebral disc degeneration, lumbar region: Secondary | ICD-10-CM | POA: Diagnosis not present

## 2018-05-22 DIAGNOSIS — M47816 Spondylosis without myelopathy or radiculopathy, lumbar region: Secondary | ICD-10-CM | POA: Diagnosis not present

## 2018-05-22 DIAGNOSIS — G8929 Other chronic pain: Secondary | ICD-10-CM

## 2018-05-22 DIAGNOSIS — M545 Low back pain: Secondary | ICD-10-CM | POA: Diagnosis not present

## 2018-05-30 DIAGNOSIS — M545 Low back pain: Secondary | ICD-10-CM | POA: Diagnosis not present

## 2018-06-06 DIAGNOSIS — M545 Low back pain: Secondary | ICD-10-CM | POA: Diagnosis not present

## 2018-06-13 DIAGNOSIS — M545 Low back pain: Secondary | ICD-10-CM | POA: Diagnosis not present

## 2018-06-15 DIAGNOSIS — J4530 Mild persistent asthma, uncomplicated: Secondary | ICD-10-CM | POA: Diagnosis not present

## 2018-06-15 DIAGNOSIS — J3089 Other allergic rhinitis: Secondary | ICD-10-CM | POA: Diagnosis not present

## 2018-06-15 DIAGNOSIS — H1045 Other chronic allergic conjunctivitis: Secondary | ICD-10-CM | POA: Diagnosis not present

## 2018-06-15 DIAGNOSIS — J301 Allergic rhinitis due to pollen: Secondary | ICD-10-CM | POA: Diagnosis not present

## 2018-06-22 ENCOUNTER — Ambulatory Visit: Payer: Medicare HMO

## 2018-06-29 ENCOUNTER — Ambulatory Visit: Payer: Medicare HMO

## 2018-06-29 ENCOUNTER — Ambulatory Visit (INDEPENDENT_AMBULATORY_CARE_PROVIDER_SITE_OTHER): Payer: Medicare HMO

## 2018-06-29 DIAGNOSIS — Z23 Encounter for immunization: Secondary | ICD-10-CM

## 2018-07-02 ENCOUNTER — Other Ambulatory Visit: Payer: Self-pay | Admitting: Family Medicine

## 2018-07-03 NOTE — Telephone Encounter (Signed)
Name of Medication: Ambien Name of Pharmacy: [facility] Last Fill or Written Date and Quantity: 12/05/17 #30 tabs with 3 refills Last Office Visit and Type: muscle spasm on 04/03/18 Next Office Visit and Type: CPE and AWV scheduled in 12/2018 Last Controlled Substance Agreement Date: 09/17/13 Last UDS:09/17/13

## 2018-07-04 ENCOUNTER — Telehealth: Payer: Self-pay | Admitting: Gastroenterology

## 2018-07-04 NOTE — Telephone Encounter (Signed)
Patient states she wants to speak with a nurse about if her insurance will cover medication iansoprazole in 2020 or if she needs a prior auth.

## 2018-07-04 NOTE — Telephone Encounter (Signed)
Informed patient that I will call when she gets a rejection at the pharmacy to initiate a PA. Patient verbalized understanding.

## 2018-07-17 ENCOUNTER — Ambulatory Visit
Admission: RE | Admit: 2018-07-17 | Discharge: 2018-07-17 | Disposition: A | Discharge status: Home / Self Care | Payer: Medicare HMO | Source: Ambulatory Visit | Attending: Family Medicine | Admitting: Family Medicine

## 2018-07-17 DIAGNOSIS — Z1231 Encounter for screening mammogram for malignant neoplasm of breast: Secondary | ICD-10-CM | POA: Diagnosis not present

## 2018-07-17 DIAGNOSIS — Z13820 Encounter for screening for osteoporosis: Secondary | ICD-10-CM | POA: Diagnosis not present

## 2018-07-17 DIAGNOSIS — E2839 Other primary ovarian failure: Secondary | ICD-10-CM | POA: Insufficient documentation

## 2018-07-18 ENCOUNTER — Encounter: Payer: Self-pay | Admitting: *Deleted

## 2018-09-11 ENCOUNTER — Encounter: Payer: Self-pay | Admitting: Family Medicine

## 2018-09-11 ENCOUNTER — Ambulatory Visit (INDEPENDENT_AMBULATORY_CARE_PROVIDER_SITE_OTHER): Payer: Medicare HMO | Admitting: Family Medicine

## 2018-09-11 VITALS — BP 132/60 | HR 102 | Temp 98.4°F | Resp 14 | Ht 64.5 in | Wt 157.8 lb

## 2018-09-11 DIAGNOSIS — R6889 Other general symptoms and signs: Secondary | ICD-10-CM

## 2018-09-11 DIAGNOSIS — R197 Diarrhea, unspecified: Secondary | ICD-10-CM

## 2018-09-11 DIAGNOSIS — R110 Nausea: Secondary | ICD-10-CM

## 2018-09-11 DIAGNOSIS — J4521 Mild intermittent asthma with (acute) exacerbation: Secondary | ICD-10-CM

## 2018-09-11 MED ORDER — PREDNISONE 10 MG PO TABS: ORAL_TABLET | ORAL | 0 refills | Status: DC

## 2018-09-11 NOTE — Progress Notes (Signed)
Subjective:     Jenny Mcfarland is a 68 y.o. female presenting for Cough (symptoms x 1 week. Started with a fever-100.1 and 100.2. Started to cough-some yellowish phlegm, hoarse, loosing voice, has had some nausea, body aches-improved.)     Cough  This is a new problem. The current episode started 1 to 4 weeks ago. The problem has been waxing and waning. The cough is productive of sputum. Associated symptoms include a fever, myalgias, postnasal drip and rhinorrhea. Pertinent negatives include no ear pain or sore throat. She has tried OTC cough suppressant and a beta-agonist inhaler (night time cold medicine) for the symptoms. The treatment provided mild relief. Her past medical history is significant for asthma.   Many of the symptoms are better Cough and hoarse voice are still present   Review of Systems  Constitutional: Positive for appetite change, fatigue and fever.  HENT: Positive for postnasal drip and rhinorrhea. Negative for congestion, ear pain, sinus pressure, sinus pain and sore throat.        Hoarse voice  Respiratory: Positive for cough.   Gastrointestinal: Positive for diarrhea and nausea.  Musculoskeletal: Positive for arthralgias and myalgias.     Social History   Tobacco Use  Smoking Status Never Smoker  Smokeless Tobacco Never Used        Objective:    BP Readings from Last 3 Encounters:  09/11/18 132/60  04/03/18 130/80  02/01/18 120/60   Wt Readings from Last 3 Encounters:  09/11/18 157 lb 12 oz (71.6 kg)  04/03/18 167 lb 4 oz (75.9 kg)  02/01/18 168 lb 8 oz (76.4 kg)    BP 132/60   Pulse (!) 102   Temp 98.4 F (36.9 C)   Resp 14   Ht 5' 4.5" (1.638 m)   Wt 157 lb 12 oz (71.6 kg)   SpO2 95%   BMI 26.66 kg/m    Physical Exam Constitutional:      General: She is not in acute distress.    Appearance: She is well-developed. She is not diaphoretic.  HENT:     Head: Normocephalic and atraumatic.     Right Ear: Ear canal normal.  There is impacted cerumen.     Left Ear: Tympanic membrane and ear canal normal.     Nose: Mucosal edema and rhinorrhea present.     Right Sinus: No maxillary sinus tenderness or frontal sinus tenderness.     Left Sinus: No maxillary sinus tenderness or frontal sinus tenderness.     Mouth/Throat:     Pharynx: Uvula midline. Posterior oropharyngeal erythema present. No oropharyngeal exudate.     Tonsils: Swelling: 0 on the right. 0 on the left.  Eyes:     General: No scleral icterus.    Conjunctiva/sclera: Conjunctivae normal.  Neck:     Musculoskeletal: Neck supple.  Cardiovascular:     Rate and Rhythm: Normal rate and regular rhythm.     Heart sounds: Normal heart sounds. No murmur.  Pulmonary:     Effort: Pulmonary effort is normal. No respiratory distress.     Breath sounds: Decreased air movement present. Wheezing present.  Lymphadenopathy:     Cervical: No cervical adenopathy.  Skin:    General: Skin is warm and dry.     Capillary Refill: Capillary refill takes less than 2 seconds.  Neurological:     Mental Status: She is alert.           Assessment & Plan:   Problem List Items  Addressed This Visit    None    Visit Diagnoses    Mild intermittent asthma with exacerbation    -  Primary   Relevant Medications   predniSONE (DELTASONE) 10 MG tablet   Flu-like symptoms         Given some decreased air movement and wheezing will treat with prednisone.   BRAT diet for diarrhea  Symptomatic care  Return if symptoms worsen or fail to improve.  Lesleigh Noe, MD

## 2018-09-11 NOTE — Patient Instructions (Addendum)
Based on your symptoms, it looks like you have a virus.   Antibiotics are not need for a viral infection but the following will help:   1. Drink plenty of fluids 2. Get lots of rest  Sinus Congestion 1) Neti Pot (Saline rinse) -- 2 times day -- if tolerated 2) Flonase (Store Brand ok) - once daily 3) Over the counter congestion medications  Cough 1) Cough drops can be helpful 2) Nyquil (or nighttime cough medication) 3) Honey is proven to be one of the best cough medications  4) Short course of Prednisone prescribed to help with inflammation  Sore Throat 1) Honey as above, cough drops 2) Ibuprofen or Aleve can be helpful 3) Salt water Gargles  If you develop fevers (Temperature >100.4), chills, worsening symptoms or symptoms lasting longer than 10 days return to clinic.     Diarrhea, Adult Diarrhea is when you pass loose and watery poop (stool) often. Diarrhea can make you feel weak and cause you to lose water in your body (get dehydrated). Losing water in your body can cause you to:  Feel tired and thirsty.  Have a dry mouth.  Go pee (urinate) less often. Diarrhea often lasts 2-3 days. However, it can last longer if it is a sign of something more serious. It is important to treat your diarrhea as told by your doctor. Follow these instructions at home: Eating and drinking     Follow these instructions as told by your doctor:  Take an ORS (oral rehydration solution). This is a drink that helps you replace fluids and minerals your body lost. It is sold at pharmacies and stores.  Drink plenty of fluids, such as: ? Water. ? Ice chips. ? Diluted fruit juice. ? Low-calorie sports drinks. ? Milk, if you want.  Avoid drinking fluids that have a lot of sugar or caffeine in them.  Eat bland, easy-to-digest foods in small amounts as you are able. These foods include: ? Bananas. ? Applesauce. ? Rice. ? Low-fat (lean) meats. ? Toast. ? Crackers.  Avoid  alcohol.  Avoid spicy or fatty foods.  Medicines  Take over-the-counter and prescription medicines only as told by your doctor.  If you were prescribed an antibiotic medicine, take it as told by your doctor. Do not stop using the antibiotic even if you start to feel better. General instructions   Wash your hands often using soap and water. If soap and water are not available, use a hand sanitizer. Others in your home should wash their hands as well. Hands should be washed: ? After using the toilet or changing a diaper. ? Before preparing, cooking, or serving food. ? While caring for a sick person. ? While visiting someone in a hospital.  Drink enough fluid to keep your pee (urine) pale yellow.  Rest at home while you get better.  Watch your condition for any changes.  Take a warm bath to help with any burning or pain from having diarrhea.  Keep all follow-up visits as told by your doctor. This is important. Contact a doctor if:  You have a fever.  Your diarrhea gets worse.  You have new symptoms.  You cannot keep fluids down.  You feel light-headed or dizzy.  You have a headache.  You have muscle cramps. Get help right away if:  You have chest pain.  You feel very weak or you pass out (faint).  You have bloody or black poop or poop that looks like tar.  You have  very bad pain, cramping, or bloating in your belly (abdomen).  You have trouble breathing or you are breathing very quickly.  Your heart is beating very quickly.  Your skin feels cold and clammy.  You feel confused.  You have signs of losing too much water in your body, such as: ? Dark pee, very little pee, or no pee. ? Cracked lips. ? Dry mouth. ? Sunken eyes. ? Sleepiness. ? Weakness. Summary  Diarrhea is when you pass loose and watery poop (stool) often.  Diarrhea can make you feel weak and cause you to lose water in your body (get dehydrated).  Take an ORS (oral rehydration  solution). This is a drink that is sold at pharmacies and stores.  Eat bland, easy-to-digest foods in small amounts as you are able.  Contact a doctor if your condition gets worse. Get help right away if you have signs that you have lost too much water in your body. This information is not intended to replace advice given to you by your health care provider. Make sure you discuss any questions you have with your health care provider. Document Released: 02/02/2008 Document Revised: 01/20/2018 Document Reviewed: 01/20/2018 Elsevier Interactive Patient Education  2019 [employer].

## 2018-09-18 ENCOUNTER — Telehealth: Payer: Self-pay | Admitting: *Deleted

## 2018-09-18 DIAGNOSIS — R197 Diarrhea, unspecified: Secondary | ICD-10-CM

## 2018-09-18 NOTE — Telephone Encounter (Signed)
Left message for patient to call back  

## 2018-09-18 NOTE — Addendum Note (Signed)
Addended by: Ellamae Sia on: 09/18/2018 02:10 PM   Modules accepted: Orders

## 2018-09-18 NOTE — Telephone Encounter (Signed)
Pt returned call to Anastasiya.

## 2018-09-18 NOTE — Telephone Encounter (Signed)
Spoke with patient. Patient will try to eat something different today besides blend diet and see how she does. If by Wednesday she is still having trouble with diarrhea she will come then and get the test done. Patient states she is also having a constant "salty-sodium/salty" taste in her mouth. No vomiting. It will be 2 weeks Wednesday of having issue with diarrhea. Advised patient we would wait to hear from patient.

## 2018-09-18 NOTE — Telephone Encounter (Signed)
Spoke to pt who states she was recently seen and given prednisone, which she finished on Saturday. She reports that she feels much better, but has still been unable to keep anything down. She has been sticking to a bland diet and has been pushing fluids as suggested and as a last resort, took 1 immodium Friday and Sunday afternoon. Pt is wanting to know how to proceed and when she can expect to be able to return to her normal diet. pls advise

## 2018-09-18 NOTE — Telephone Encounter (Signed)
During last visit, the issue was related to diarrhea.   Ordered GI pathogen stool test which she can come in to be done to look for infectious cause.   If having nausea, we can try an anti nausea medication.   Most viral causes of diarrhea last up to 2 weeks, if stool test is negative, recommend follow-up with Tower, [name], MD if not improving.   Jenny Mcfarland

## 2018-09-21 NOTE — Telephone Encounter (Signed)
Left message for patient

## 2018-09-21 NOTE — Telephone Encounter (Signed)
Spoke with patient. Patient is doing better with her bowels. She has not had diarrhea in 2 days. Feeling better. Not going to come in for stool test since she is improving. She does have runny nose now. Patient will come back in if her symptoms return or get worse again. Sending to Dr. Einar Pheasant as an Juluis Rainier

## 2018-09-21 NOTE — Telephone Encounter (Signed)
Pt said she is feeling better, in AM still slight head and chest congestion with cough but only in early AM. Pt has slowly increased diet and no loose stool now; stools have consistency. Pt is opting not to do stool kit now. Pt request cb.

## 2018-10-05 DIAGNOSIS — L7211 Pilar cyst: Secondary | ICD-10-CM | POA: Diagnosis not present

## 2018-10-05 DIAGNOSIS — L918 Other hypertrophic disorders of the skin: Secondary | ICD-10-CM | POA: Diagnosis not present

## 2018-10-26 DIAGNOSIS — L729 Follicular cyst of the skin and subcutaneous tissue, unspecified: Secondary | ICD-10-CM | POA: Diagnosis not present

## 2018-10-26 DIAGNOSIS — D485 Neoplasm of uncertain behavior of skin: Secondary | ICD-10-CM | POA: Diagnosis not present

## 2018-12-27 ENCOUNTER — Telehealth: Payer: Self-pay | Admitting: Family Medicine

## 2018-12-27 DIAGNOSIS — Z0000 Encounter for general adult medical examination without abnormal findings: Secondary | ICD-10-CM

## 2018-12-27 DIAGNOSIS — E785 Hyperlipidemia, unspecified: Secondary | ICD-10-CM

## 2018-12-27 DIAGNOSIS — E559 Vitamin D deficiency, unspecified: Secondary | ICD-10-CM

## 2018-12-27 NOTE — Telephone Encounter (Signed)
-----   Message from Ellamae Sia sent at 12/27/2018  2:07 PM EDT ----- Regarding: FW: Lab orders for Wednesday, 5.6.20  ----- Message ----- From: Lesleigh Noe, MD Sent: 12/27/2018  12:45 PM EDT To: Candi Leash, MD Subject: RE: Lab orders for Wednesday, 5.6.20           It looks like this is a Dr. Glori Bickers patient ----- Message ----- From: Ellamae Sia Sent: 12/27/2018  11:50 AM EDT To: Lesleigh Noe, MD Subject: Lab orders for Wednesday, 5.6.20               Lab orders, thanks

## 2019-01-01 ENCOUNTER — Ambulatory Visit (INDEPENDENT_AMBULATORY_CARE_PROVIDER_SITE_OTHER): Payer: Medicare HMO

## 2019-01-01 DIAGNOSIS — Z0000 Encounter for general adult medical examination without abnormal findings: Secondary | ICD-10-CM | POA: Diagnosis not present

## 2019-01-01 NOTE — Progress Notes (Signed)
Subjective:   Jenny Mcfarland is a 68 y.o. female who presents for Medicare Annual (Subsequent) preventive examination.  Review of Systems:  N/A Cardiac Risk Factors include: advanced age (>80men, >77 women);dyslipidemia     Objective:     Vitals: There were no vitals taken for this visit.  There is no height or weight on file to calculate BMI.  Advanced Directives 12/22/2017 11/04/2017 09/09/2017 09/02/2017  Does Patient Have a Medical Advance Directive? Yes Yes Yes Yes  Type of Advance Directive Living will;Healthcare Power of [place];Living will [place];Living will -  Does patient want to make changes to medical advance directive? - - Yes (MAU/Ambulatory/Procedural Areas - Information given) -  Copy of [place] in Chart? No - copy requested Yes Yes -    Tobacco Social History   Tobacco Use  Smoking Status Never Smoker  Smokeless Tobacco Never Used     Counseling given: No   Clinical Intake:  Pre-visit preparation completed: Yes  Pain : No/denies pain Pain Score: 0-No pain     Nutritional Status: BMI 25 -29 Overweight Nutritional Risks: None Diabetes: No  How often do you need to have someone help you when you read instructions, pamphlets, or other written materials from your doctor or pharmacy?: 1 - Never  Interpreter Needed?: No  Comments: pt lives independently Information entered by :: LPinson, LPN  Past Medical History:  Diagnosis Date  . Allergic rhinitis   . Asthma    WELL CONTROLLED  . Barrett's esophagus 2005  . Complication of anesthesia   . GERD (gastroesophageal reflux disease) 11/2008  . Hemorrhoids   . Hiatal hernia   . History of chicken pox   . IBS (irritable bowel syndrome)   . Osteopenia   . Peptic stricture of esophagus 11-2008  . PONV (postoperative nausea and vomiting)    N/V WITH GALLBLADDER  . Recurrent sinusitis    Past Surgical History:  Procedure  Laterality Date  . BREAST BIOPSY Right 07/26/2017   INTRADUCTAL PAPILLOMA.   Marland Kitchen BREAST LUMPECTOMY Right 08/2017  . BREAST LUMPECTOMY WITH NEEDLE LOCALIZATION Right 09/09/2017   Procedure: BREAST LUMPECTOMY WITH NEEDLE LOCALIZATION;  Surgeon: Leonie Green, MD;  Location: [facility] ORS;  Service: General;  Laterality: Right;  . CHOLECYSTECTOMY  2010  . TUBAL LIGATION     Family History  Problem Relation Age of Onset  . Aortic aneurysm Mother   . Heart disease Mother   . Heart attack Brother        early 109's  . Arthritis Brother   . Breast cancer Sister 15  . Colon cancer Neg Hx    Social History   Socioeconomic History  . Marital status: Single    Spouse name: Not on file  . Number of children: 2  . Years of education: Not on file  . Highest education level: Not on file  Occupational History  . Occupation: Research scientist (physical sciences) for pediatric[employer]  Social Needs  . Financial resource strain: Not on file  . Food insecurity:    Worry: Not on file    Inability: Not on file  . Transportation needs:    Medical: Not on file    Non-medical: Not on file  Tobacco Use  . Smoking status: Never Smoker  . Smokeless tobacco: Never Used  Substance and Sexual Activity  . Alcohol use: Yes    Alcohol/week: 0.0 standard drinks    Comment: RARE  . Drug use: No  .  Sexual activity: Not Currently  Lifestyle  . Physical activity:    Days per week: Not on file    Minutes per session: Not on file  . Stress: Not on file  Relationships  . Social connections:    Talks on phone: Not on file    Gets together: Not on file    Attends religious service: Not on file    Active member of club or organization: Not on file    Attends meetings of clubs or organizations: Not on file    Relationship status: Not on file  Other Topics Concern  . Not on file  Social History Narrative   Daily caffeine use - soft drinks/tea       Separating from husband 1/17    Outpatient Encounter Medications as of 01/01/2019   Medication Sig  . Cholecalciferol (VITAMIN D3 PO) Take 1 capsule by mouth 3 (three) times a week.  . fexofenadine (ALLEGRA) 180 MG tablet Take 180 mg by mouth every morning.   . Fluticasone-Salmeterol (ADVAIR DISKUS) 250-50 MCG/DOSE AEPB Inhale 1 puff into the lungs 2 (two) times daily as needed. (Patient taking differently: Inhale 1 puff into the lungs 2 (two) times daily. )  . lansoprazole (PREVACID SOLUTAB) 30 MG disintegrating tablet Take 1 tablet (30 mg total) by mouth 2 (two) times daily.  Marland Kitchen levalbuterol (XOPENEX HFA) 45 MCG/ACT inhaler Inhale 1 puff into the lungs every 4 (four) hours as needed for wheezing.  Marland Kitchen zolpidem (AMBIEN) 5 MG tablet TAKE 1/2 TO 1 (ONE-HALF TO ONE) TABLET BY MOUTH AT BEDTIME AS NEEDED  . [DISCONTINUED] predniSONE (DELTASONE) 10 MG tablet Take 2 pills daily for 3 days (20 mg) then take 1 pill daily for 3 days (10 mg)   No facility-administered encounter medications on file as of 01/01/2019.     Activities of Daily Living In your present state of health, do you have any difficulty performing the following activities: 01/01/2019  Hearing? N  Vision? N  Difficulty concentrating or making decisions? N  Walking or climbing stairs? N  Dressing or bathing? N  Doing errands, shopping? N  Preparing Food and eating ? N  Using the Toilet? N  In the past six months, have you accidently leaked urine? N  Do you have problems with loss of bowel control? N  Managing your Medications? N  Managing your Finances? N  Housekeeping or managing your Housekeeping? N  Some recent data might be hidden    Patient Care Team: Tower, [name], MD as PCP - General (Family Medicine) Ladene Artist, MD as Consulting Physician (Gastroenterology)    Assessment:   This is a routine wellness examination for [name].  Vision Screening Comments: Vision exam in 2019 with Dr. Maryruth Hancock B.   Exercise Activities and Dietary recommendations Current Exercise Habits: Home exercise routine, Type of  exercise: walking, Time (Minutes): 20, Frequency (Times/Week): 3, Weekly Exercise (Minutes/Week): 60, Intensity: Mild, Exercise limited by: None identified  Goals    . Increase physical activity     Starting 01/01/19, I will continue to walk at least 20 minutes 3 days per week.        Fall Risk Fall Risk  01/01/2019 12/22/2017 06/17/2017 12/21/2016  Falls in the past year? 0 No No No   Depression Screen PHQ 2/9 Scores 01/01/2019 12/22/2017 06/17/2017 12/21/2016  PHQ - 2 Score 0 0 0 0  PHQ- 9 Score 0 0 - -     Cognitive Function MMSE - Mini Mental State Exam  01/01/2019 12/22/2017  Orientation to time 5 5  Orientation to Place 5 5  Registration 3 3  Attention/ Calculation 0 0  Recall 3 3  Language- name 2 objects 0 0  Language- repeat 1 1  Language- follow 3 step command 0 3  Language- read & follow direction 0 0  Write a sentence 0 0  Copy design 0 0  Total score 17 20       PLEASE NOTE: A Mini-Cog screen was completed. Maximum score is 17. A value of 0 denotes this part of [place] MMSE was not completed or the patient failed this part of the Mini-Cog screening.   Mini-Cog Screening Orientation to Time - Max 5 pts Orientation to Place - Max 5 pts Registration - Max 3 pts Recall - Max 3 pts Language Repeat - Max 1 pts    Immunization History  Administered Date(s) Administered  . Influenza Split 06/21/2012  . Influenza,inj,Quad PF,6+ Mos 06/13/2013, 07/01/2014, 06/16/2015, 06/25/2016, 06/17/2017, 06/29/2018  . Pneumococcal Conjugate-13 12/21/2016  . Tdap 05/25/2011      Screening Tests Health Maintenance  Topic Date Due  . PNA vac Low Risk Adult (2 of 2 - PPSV23) 08/29/2020 (Originally 12/21/2017)  . Hepatitis C Screening  10/01/2020 (Originally July 21, 1951)  . INFLUENZA VACCINE  03/31/2019  . MAMMOGRAM  07/18/2019  . DEXA SCAN  07/17/2020  . COLONOSCOPY  03/12/2021  . TETANUS/TDAP  05/24/2021       Plan:     I have personally reviewed, addressed, and noted the  following in the patient's chart:  A. Medical and social history B. Use of alcohol, tobacco or illicit drugs  C. Current medications and supplements D. Functional ability and status E.  Nutritional status F.  Physical activity G. Advance directives H. List of other physicians I.  Hospitalizations, surgeries, and ER visits in previous 12 months J.  Vitals (unless it is a telemedicine encounter) K. Screenings to include cognitive, depression, hearing, vision (NOTE: hearing and vision screenings not completed in telemedicine encounter) L. Referrals and appointments   In addition, I have reviewed and discussed with patient certain preventive protocols, quality metrics, and best practice recommendations. A written personalized care plan for preventive services and recommendations were provided to patient.  With patient's permission, we connected on 01/01/19 at 10:00 AM EDT by a video enabled telemedicine application. Two patient identifiers were used to ensure the encounter occurred with the correct person.    Patient was in home and writer was in office.   Signed,   Lindell Noe, MHA, BS, LPN Health Coach

## 2019-01-01 NOTE — Progress Notes (Signed)
PCP notes:   Health maintenance:  PPSV23 - postponed  Abnormal screenings:   None  Patient concerns:   None  Nurse concerns:  None  Next PCP appt:   01/08/19 @ 1130  I reviewed health advisor's note, was available for consultation, and agree with documentation and plan. Loura Pardon MD

## 2019-01-01 NOTE — Patient Instructions (Signed)
Jenny Mcfarland , Thank you for taking time to come for your Medicare Wellness Visit. I appreciate your ongoing commitment to your health goals. Please review the following plan we discussed and let me know if I can assist you in the future.   These are the goals we discussed: Goals    . Increase physical activity     Starting 01/01/19, I will continue to walk at least 20 minutes 3 days per week.        This is a list of the screening recommended for you and due dates:  Health Maintenance  Topic Date Due  . Pneumonia vaccines (2 of 2 - PPSV23) 08/29/2020*  .  Hepatitis C: One time screening is recommended by Center for Disease Control  (CDC) for  adults born from [birth year] through 1964.   10/01/2020*  . Flu Shot  03/31/2019  . Mammogram  07/18/2019  . DEXA scan (bone density measurement)  07/17/2020  . Colon Cancer Screening  03/12/2021  . Tetanus Vaccine  05/24/2021  *Topic was postponed. The date shown is not the original due date.   Preventive Care for Adults  A healthy lifestyle and preventive care can promote health and wellness. Preventive health guidelines for adults include the following key practices.  . A routine yearly physical is a good way to check with your health care provider about your health and preventive screening. It is a chance to share any concerns and updates on your health and to receive a thorough exam.  . Visit your dentist for a routine exam and preventive care every 6 months. Brush your teeth twice a day and floss once a day. Good oral hygiene prevents tooth decay and gum disease.  . The frequency of eye exams is based on your age, health, family medical history, use  of contact lenses, and other factors. Follow your health care provider's recommendations for frequency of eye exams.  . Eat a healthy diet. Foods like vegetables, fruits, whole grains, low-fat dairy products, and lean protein foods contain the nutrients you need without too many calories. Decrease  your intake of foods high in solid fats, added sugars, and salt. Eat the right amount of calories for you. Get information about a proper diet from your health care provider, if necessary.  . Regular physical exercise is one of the most important things you can do for your health. Most adults should get at least 150 minutes of moderate-intensity exercise (any activity that increases your heart rate and causes you to sweat) each week. In addition, most adults need muscle-strengthening exercises on 2 or more days a week.  Silver Sneakers may be a benefit available to you. To determine eligibility, you may visit the website: www.silversneakers.com or contact program at 4030589454 Mon-Fri between 8AM-8PM.   . Maintain a healthy weight. The body mass index (BMI) is a screening tool to identify possible weight problems. It provides an estimate of body fat based on height and weight. Your health care provider can find your BMI and can help you achieve or maintain a healthy weight.   For adults 20 years and older: ? A BMI below 18.5 is considered underweight. ? A BMI of 18.5 to 24.9 is normal. ? A BMI of 25 to 29.9 is considered overweight. ? A BMI of 30 and above is considered obese.   . Maintain normal blood lipids and cholesterol levels by exercising and minimizing your intake of saturated fat. Eat a balanced diet with plenty of fruit and  vegetables. Blood tests for lipids and cholesterol should begin at age 60 and be repeated every 5 years. If your lipid or cholesterol levels are high, you are over 50, or you are at high risk for heart disease, you may need your cholesterol levels checked more frequently. Ongoing high lipid and cholesterol levels should be treated with medicines if diet and exercise are not working.  . If you smoke, find out from your health care provider how to quit. If you do not use tobacco, please do not start.  . If you choose to drink alcohol, please do not consume more than  2 drinks per day. One drink is considered to be 12 ounces (355 mL) of beer, 5 ounces (148 mL) of wine, or 1.5 ounces (44 mL) of liquor.  . If you are 22-60 years old, ask your health care provider if you should take aspirin to prevent strokes.  . Use sunscreen. Apply sunscreen liberally and repeatedly throughout the day. You should seek shade when your shadow is shorter than you. Protect yourself by wearing long sleeves, pants, a wide-brimmed hat, and sunglasses year round, whenever you are outdoors.  . Once a month, do a whole body skin exam, using a mirror to look at the skin on your back. Tell your health care provider of new moles, moles that have irregular borders, moles that are larger than a pencil eraser, or moles that have changed in shape or color.

## 2019-01-03 ENCOUNTER — Other Ambulatory Visit (INDEPENDENT_AMBULATORY_CARE_PROVIDER_SITE_OTHER): Payer: Medicare HMO

## 2019-01-03 ENCOUNTER — Other Ambulatory Visit: Payer: Self-pay

## 2019-01-03 DIAGNOSIS — E559 Vitamin D deficiency, unspecified: Secondary | ICD-10-CM | POA: Diagnosis not present

## 2019-01-03 DIAGNOSIS — E785 Hyperlipidemia, unspecified: Secondary | ICD-10-CM

## 2019-01-03 LAB — LIPID PANEL
Cholesterol: 191 mg/dL (ref 0–200)
HDL: 72.3 mg/dL (ref >39.00)
LDL Cholesterol: 102 mg/dL — ABNORMAL HIGH (ref 0–99)
NonHDL: 119.08
Total CHOL/HDL Ratio: 3
Triglycerides: 83 mg/dL (ref 0.0–149.0)
VLDL: 16.6 mg/dL (ref 0.0–40.0)

## 2019-01-03 LAB — COMPREHENSIVE METABOLIC PANEL
ALT: 8 U/L (ref 0–35)
AST: 11 U/L (ref 0–37)
Albumin: 3.8 g/dL (ref 3.5–5.2)
Alkaline Phosphatase: 64 U/L (ref 39–117)
BUN: 7 mg/dL (ref 6–23)
CO2: 29 mEq/L (ref 19–32)
Calcium: 9 mg/dL (ref 8.4–10.5)
Chloride: 106 mEq/L (ref 96–112)
Creatinine, Ser: 0.7 mg/dL (ref 0.40–1.20)
GFR: 83.42 mL/min (ref >60.00)
Glucose, Bld: 93 mg/dL (ref 70–99)
Potassium: 3.6 mEq/L (ref 3.5–5.1)
Sodium: 142 mEq/L (ref 135–145)
Total Bilirubin: 0.7 mg/dL (ref 0.2–1.2)
Total Protein: 6.3 g/dL (ref 6.0–8.3)

## 2019-01-03 LAB — VITAMIN D 25 HYDROXY (VIT D DEFICIENCY, FRACTURES): VITD: 27.4 ng/mL — ABNORMAL LOW (ref 30.00–100.00)

## 2019-01-08 ENCOUNTER — Ambulatory Visit (INDEPENDENT_AMBULATORY_CARE_PROVIDER_SITE_OTHER): Payer: Medicare HMO | Admitting: Family Medicine

## 2019-01-08 ENCOUNTER — Encounter: Payer: Self-pay | Admitting: Family Medicine

## 2019-01-08 VITALS — Wt 161.0 lb

## 2019-01-08 DIAGNOSIS — M8580 Other specified disorders of bone density and structure, unspecified site: Secondary | ICD-10-CM | POA: Diagnosis not present

## 2019-01-08 DIAGNOSIS — J301 Allergic rhinitis due to pollen: Secondary | ICD-10-CM

## 2019-01-08 DIAGNOSIS — K2100 Gastro-esophageal reflux disease with esophagitis, without bleeding: Secondary | ICD-10-CM

## 2019-01-08 DIAGNOSIS — E785 Hyperlipidemia, unspecified: Secondary | ICD-10-CM

## 2019-01-08 DIAGNOSIS — E559 Vitamin D deficiency, unspecified: Secondary | ICD-10-CM | POA: Diagnosis not present

## 2019-01-08 MED ORDER — ZOLPIDEM TARTRATE 5 MG PO TABS: ORAL_TABLET | ORAL | 3 refills | Status: DC

## 2019-01-08 NOTE — Assessment & Plan Note (Signed)
Level is still in the 20s  Disc imp to bone and overall health  Will inc dose to 3000 iu daily (instead of dosing weekly)

## 2019-01-08 NOTE — Assessment & Plan Note (Signed)
Discussed pollen avoidance  Takes allegra  xopenex prn

## 2019-01-08 NOTE — Assessment & Plan Note (Signed)
Well controlled with prevacid in setting of past barretts esophagus  Plans to f/u with GI summer or fall when they are open

## 2019-01-08 NOTE — Progress Notes (Signed)
Virtual Visit via Video Note  I connected with [name] on 01/08/19 at 11:30 AM EDT by a video enabled telemedicine application and verified that I am speaking with the correct person using two identifiers.  Location: Patient: home Provider: office   I discussed the limitations of evaluation and management by telemedicine and the availability of in person appointments. The patient expressed understanding and agreed to proceed.  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    Had amw 5/4 PPSV23 was postponed No other gaps or concerns  Mammogram 11/19  Self breast exam -no lumps or changes   Colonoscopy 7/17 with 5 y recall    Wt Readings from Last 3 Encounters:  09/11/18 157 lb 12 oz (71.6 kg)  04/03/18 167 lb 4 oz (75.9 kg)  02/01/18 168 lb 8 oz (76.4 kg)  weight at home 161  Lots of outdoor work for exercise  Eating fairly healthy   No BP problems or symptoms  BP Readings from Last 3 Encounters:  09/11/18 132/60  04/03/18 130/80  02/01/18 120/60   Last eye exam was 6/19 -no problems  Next one is end of July   Lab Results  Component Value Date   CREATININE 0.70 01/03/2019   BUN 7 01/03/2019   NA 142 01/03/2019   K 3.6 01/03/2019   CL 106 01/03/2019   CO2 29 01/03/2019   Lab Results  Component Value Date   ALT 8 01/03/2019   AST 11 01/03/2019   ALKPHOS 64 01/03/2019   BILITOT 0.7 01/03/2019   Glucose 93 fasting   Osteopenia 11/19 dexa stable at hip and down slt at forearm  Vit D level of 27.4 That is up from 26 a year ago  Takes 6000 iu weekly  Not taking daily  No falls  No broken bones   Hyperlipidemia Lab Results  Component Value Date   CHOL 191 01/03/2019   CHOL 193 12/22/2017   CHOL 205 (H) 12/21/2016   Lab Results  Component Value Date   HDL 72.30 01/03/2019   HDL  69.40 12/22/2017   HDL 77.00 12/21/2016   Lab Results  Component Value Date   LDLCALC 102 (H) 01/03/2019   LDLCALC 102 (H) 12/22/2017   LDLCALC 108 (H) 12/21/2016   Lab Results  Component Value Date   TRIG 83.0 01/03/2019   TRIG 106.0 12/22/2017   TRIG 101.0 12/21/2016   Lab Results  Component Value Date   CHOLHDL 3 01/03/2019   CHOLHDL 3 12/22/2017   CHOLHDL 3 12/21/2016   Lab Results  Component Value Date   LDLDIRECT 135.4 09/17/2013   LDLDIRECT 125.5 09/11/2012   LDLDIRECT 121.7 05/25/2011   No red meat or fried food   She had a cyst taken off head-not malignant  It was bothersome and got larger   Needs refill of ambien   On prevacid for GERD   Review of Systems  Constitutional: Negative for chills, fever, malaise/fatigue and weight loss.  HENT: Positive for congestion. Negative for hearing loss and sore throat.        Allergies- congestion/rhinorrhea  Eyes: Negative for blurred vision.  Respiratory: Negative for cough, shortness of breath and wheezing.   Cardiovascular: Negative for chest pain and palpitations.  Gastrointestinal: Negative for heartburn, nausea and vomiting.  GERD controlled with prevacid  Musculoskeletal: Negative for joint pain and myalgias.  Skin: Negative for itching and rash.  Neurological: Negative for dizziness and headaches.  Endo/Heme/Allergies: Positive for environmental allergies.  Psychiatric/Behavioral: Negative for depression. The patient has insomnia. The patient is not nervous/anxious.       Patient Active Problem List   Diagnosis Date Noted  . Low back pain 04/03/2018  . Plantar wart of left foot 04/03/2018  . Welcome to Medicare preventive visit 12/21/2016  . Estrogen deficiency 06/04/2015  . Vitamin D deficiency 09/23/2014  . Thoracic or lumbosacral neuritis or radiculitis, unspecified 05/21/2013  . Gynecological examination 05/25/2011  . Other screening mammogram 05/25/2011  . Routine general medical  examination at a health care facility 05/25/2011  . Osteopenia 12/27/2010  . IBS (irritable bowel syndrome) 12/25/2010  . Allergic rhinitis 12/25/2010  . Asthma 12/25/2010  . Mild hyperlipidemia 12/25/2010  . Family history of breast cancer 12/25/2010  . Family history of aortic aneurysm 12/25/2010  . GERD 11/08/2008  . BARRETT'S ESOPHAGUS 11/08/2008   Past Medical History:  Diagnosis Date  . Allergic rhinitis   . Asthma    WELL CONTROLLED  . Barrett's esophagus 2005  . Complication of anesthesia   . GERD (gastroesophageal reflux disease) 11/2008  . Hemorrhoids   . Hiatal hernia   . History of chicken pox   . IBS (irritable bowel syndrome)   . Osteopenia   . Peptic stricture of esophagus 11-2008  . PONV (postoperative nausea and vomiting)    N/V WITH GALLBLADDER  . Recurrent sinusitis    Past Surgical History:  Procedure Laterality Date  . BREAST BIOPSY Right 07/26/2017   INTRADUCTAL PAPILLOMA.   Marland Kitchen BREAST LUMPECTOMY Right 08/2017  . BREAST LUMPECTOMY WITH NEEDLE LOCALIZATION Right 09/09/2017   Procedure: BREAST LUMPECTOMY WITH NEEDLE LOCALIZATION;  Surgeon: Leonie Green, MD;  Location: [facility] ORS;  Service: General;  Laterality: Right;  . CHOLECYSTECTOMY  2010  . TUBAL LIGATION     Social History   Tobacco Use  . Smoking status: Never Smoker  . Smokeless tobacco: Never Used  Substance Use Topics  . Alcohol use: Yes    Alcohol/week: 0.0 standard drinks    Comment: RARE  . Drug use: No   Family History  Problem Relation Age of Onset  . Aortic aneurysm Mother   . Heart disease Mother   . Heart attack Brother        early 40's  . Arthritis Brother   . Breast cancer Sister 40  . Colon cancer Neg Hx    Allergies  Allergen Reactions  . Levofloxacin Itching and Other (See Comments)    Felt like she had bugs crawling all over her.  Lavella Lemons [Benzonatate] Nausea And Vomiting    Made GERD worse   Current Outpatient Medications on File Prior to Visit   Medication Sig Dispense Refill  . Cholecalciferol (VITAMIN D3 PO) Take 1 capsule by mouth 3 (three) times a week.    . fexofenadine (ALLEGRA) 180 MG tablet Take 180 mg by mouth every morning.     . Fluticasone-Salmeterol (ADVAIR DISKUS) 250-50 MCG/DOSE AEPB Inhale 1 puff into the lungs 2 (two) times daily as needed. (Patient taking differently: Inhale 1 puff into the lungs 2 (two) times daily. ) 60 each 11  . lansoprazole (PREVACID SOLUTAB) 30 MG disintegrating tablet Take 1 tablet (30 mg total) by mouth 2 (two) times daily. 60 tablet 11  . levalbuterol (XOPENEX HFA) 45 MCG/ACT inhaler Inhale 1  puff into the lungs every 4 (four) hours as needed for wheezing.     No current facility-administered medications on file prior to visit.     Observations/Objective: Patient appears well/ as her usual self  In no distress  No facial swelling or asymmetry  Normal affect/mood -pleasant and talkative Not hoarse  No cough or sob with speech /no sneezing and does not clear throat  No skin change/rash or pallor     Assessment and Plan: Problem List Items Addressed This Visit      Respiratory   Allergic rhinitis    Discussed pollen avoidance  Takes allegra  xopenex prn        Digestive   GERD    Well controlled with prevacid in setting of past barretts esophagus  Plans to f/u with GI summer or fall when they are open          Musculoskeletal and Integument   Osteopenia - Primary    dexa utd 11/19 - fairly stable No falls or fractures  Disc exercise  Disc vit D-needs to inc to daily 3000 iu  Will watch         Other   Mild hyperlipidemia    Disc goals for lipids and reasons to control them Rev last labs with pt Rev low sat fat diet in detail Well controlled with diet       Vitamin D deficiency    Level is still in the 20s  Disc imp to bone and overall health  Will inc dose to 3000 iu daily (instead of dosing weekly)           Follow Up Instructions: Increase your  vitamin D3 over the counter to 3000 iu every day  Stay active/exercise  Keep eating a healthy diet  Call us this summer when you want to come in for your pneumonia vaccine   We will send you a copy of your las and dexa report   I refilled your ambien px - use caution with it    I discussed the assessment and treatment plan with the patient. The patient was provided an opportunity to ask questions and all were answered. The patient agreed with the plan and demonstrated an understanding of the instructions.   The patient was advised to call back or seek an in-person evaluation if the symptoms worsen or if the condition fails to improve as anticipated.     Loura Pardon, MD

## 2019-01-08 NOTE — Assessment & Plan Note (Signed)
dexa utd 11/19 - fairly stable No falls or fractures  Disc exercise  Disc vit D-needs to inc to daily 3000 iu  Will watch

## 2019-01-08 NOTE — Assessment & Plan Note (Signed)
Disc goals for lipids and reasons to control them Rev last labs with pt Rev low sat fat diet in detail Well controlled with diet  

## 2019-01-08 NOTE — Patient Instructions (Signed)
Increase your vitamin D3 over the counter to 3000 iu every day  Stay active/exercise  Keep eating a healthy diet  Call us this summer when you want to come in for your pneumonia vaccine   We will send you a copy of your las and dexa report   I refilled your ambien px - use caution with it

## 2019-01-15 NOTE — Telephone Encounter (Signed)
I was afraid that her insurance would not cover a cbc or tsh because the visit was virtual- we have to charge differently  I had to have separate diagnoses for each lab test ordered (I had none for those 2 particular tests) It is different when in the office and we can use a code for health mt exam  (but [employer] up until now will not pay for it)  We should be able to do them in the future

## 2019-01-15 NOTE — Telephone Encounter (Signed)
Pt received her labs as requested by mail. She is asking why she was not tested for TSH and CBC panel labs. She is requesting a callback.

## 2019-01-18 NOTE — Telephone Encounter (Signed)
Pt aware of dr tower's comments

## 2019-01-25 IMAGING — MR MR LUMBAR SPINE W/O CM
5 series · 31 of 48 positions shown · non-contrast
Comparison: None.

CLINICAL DATA: Chronic left-sided low back pain with left sciatica.

EXAM:
MRI LUMBAR SPINE WITHOUT CONTRAST
TECHNIQUE: Multiplanar, multisequence MR imaging of the lumbar spine was
performed. No intravenous contrast was administered.

[Series 5: T2 · sagittal · 4.0mm · 0.81mm/px · 6 of 17 slices shown (1 of 2)]
[im 1/17]
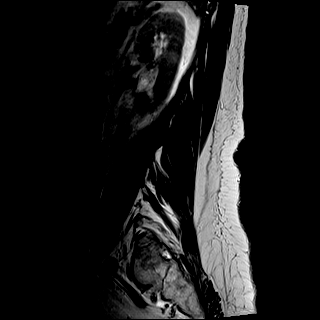
[im 4/17]
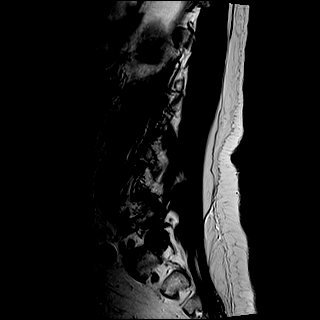
[im 7/17]
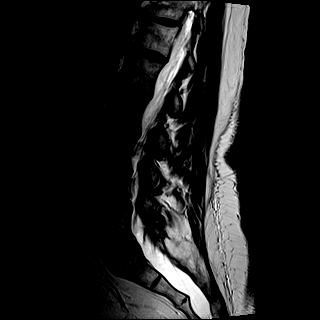
[im 10/17]
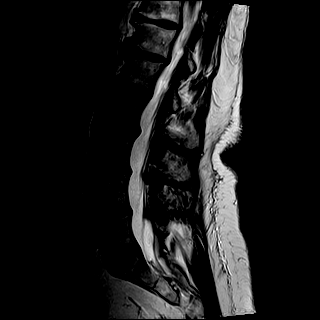
[im 13/17]
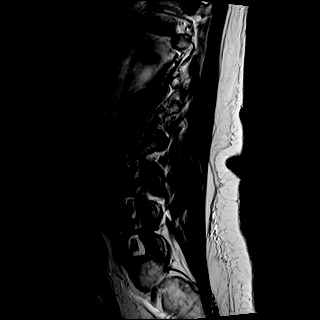
[im 17/17]
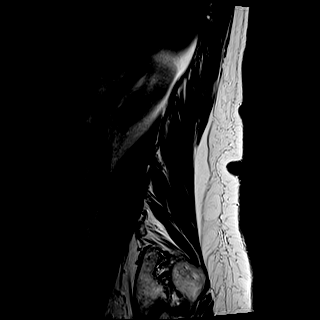

[Series 6: T1 · sagittal · 4.0mm · 0.81mm/px · 7 of 17 slices shown (1 of 2)]
[im 1/17]
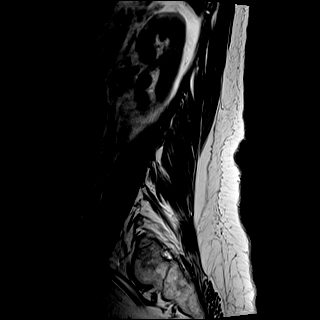
[im 3/17]
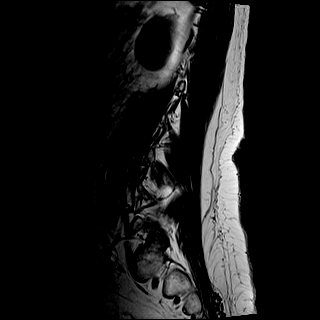
[im 6/17]
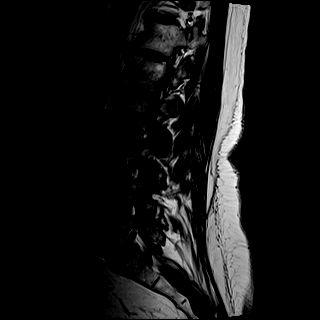
[im 9/17]
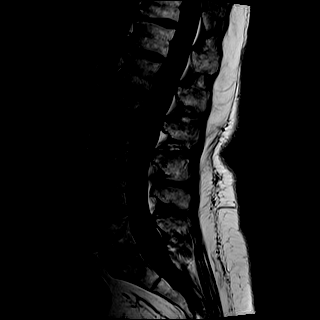
[im 11/17]
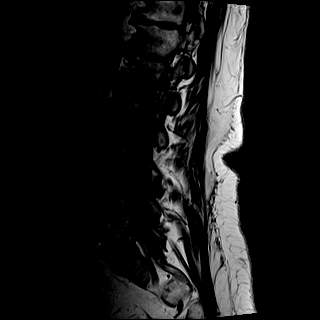
[im 14/17]
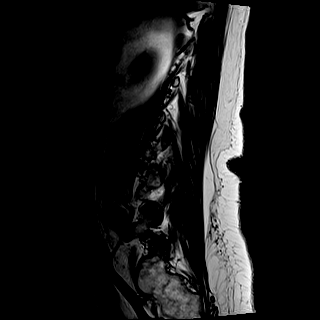
[im 17/17]
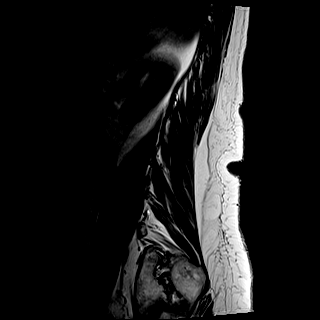

[Series 7: STIR · sagittal · 4.0mm · 0.41mm/px · 2 of 17 slices shown]
[im 1/17]
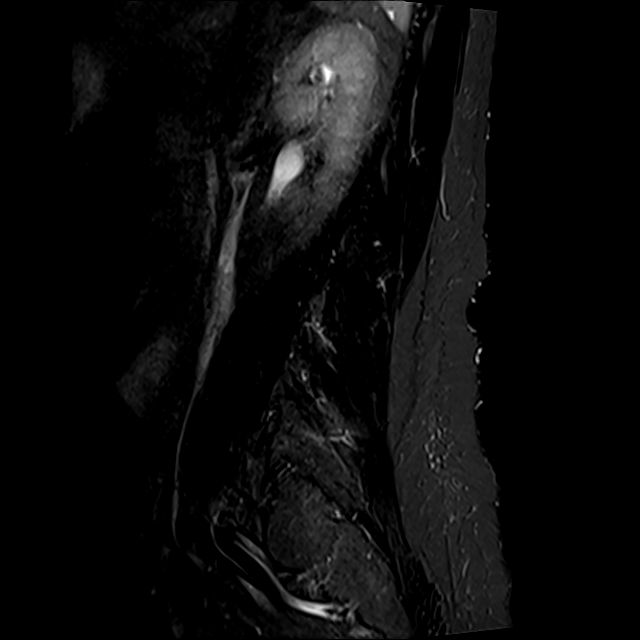
[im 3/17]
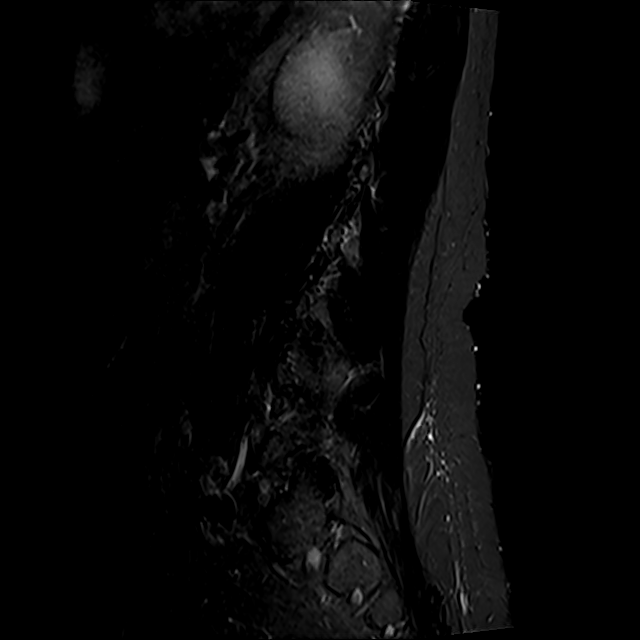

[Series 8: T2 · axial · 4.0mm · 0.78mm/px · z∈[-164,+58]mm · 8 of 36 slices shown (2 of 2)]
[im 1/36]
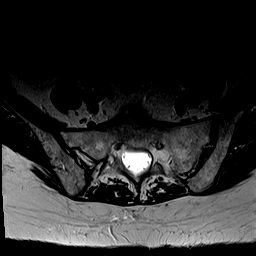
[im 6/36]
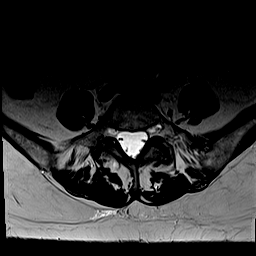
[im 11/36]
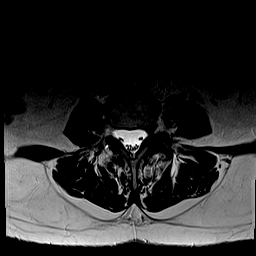
[im 17/36]
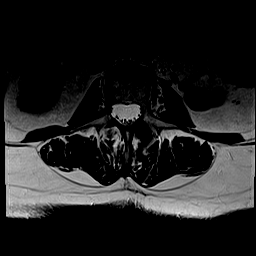
[im 19/36]
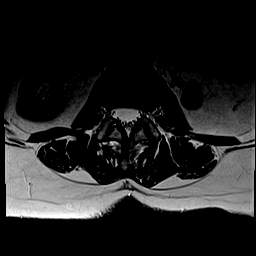
[im 25/36]
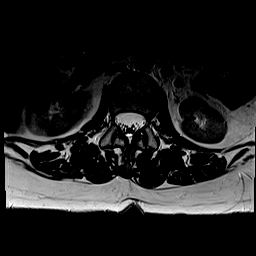
[im 30/36]
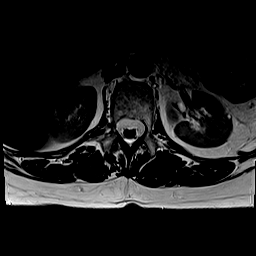
[im 36/36]
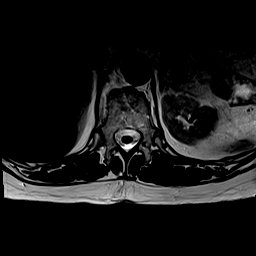

[Series 9: T1 · axial · 4.0mm · 0.39mm/px · z∈[-164,+58]mm · 8 of 36 slices shown (2 of 2)]
[im 1/36]
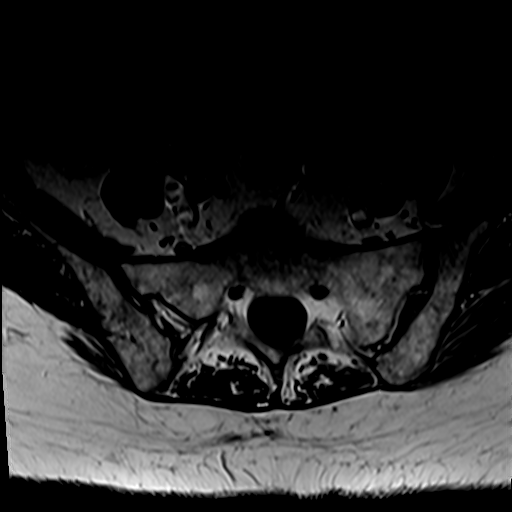
[im 6/36]
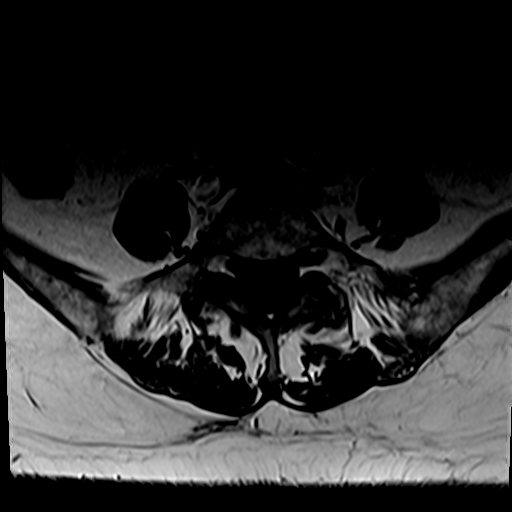
[im 11/36]
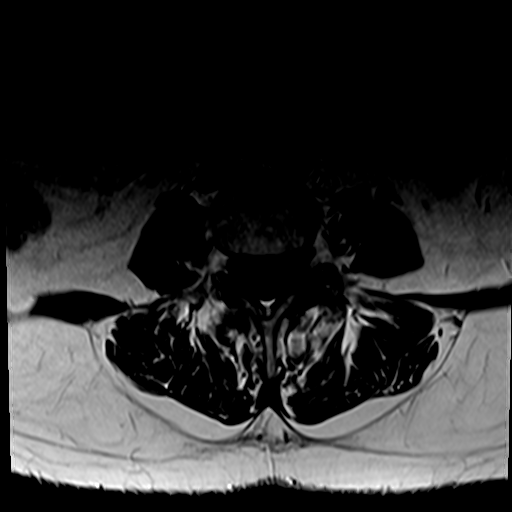
[im 17/36]
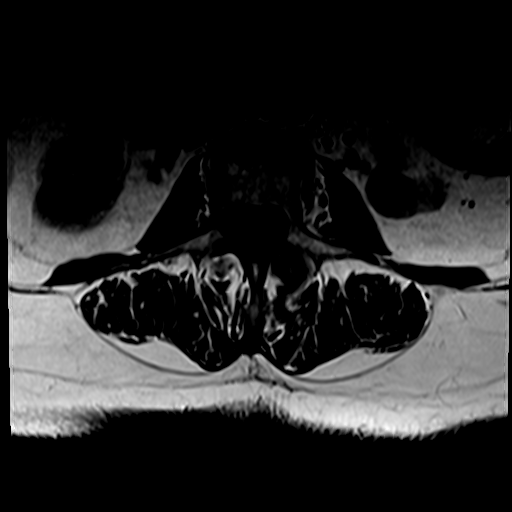
[im 19/36]
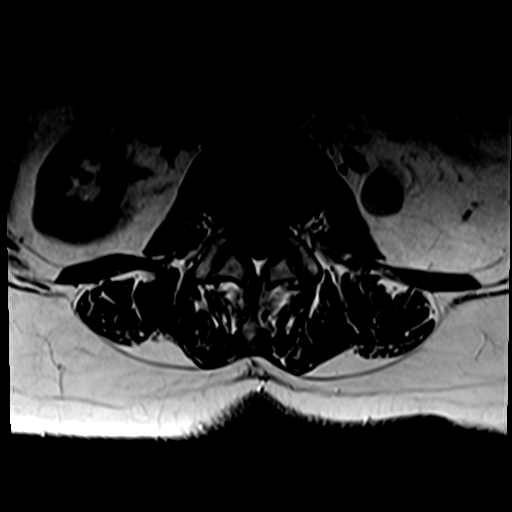
[im 25/36]
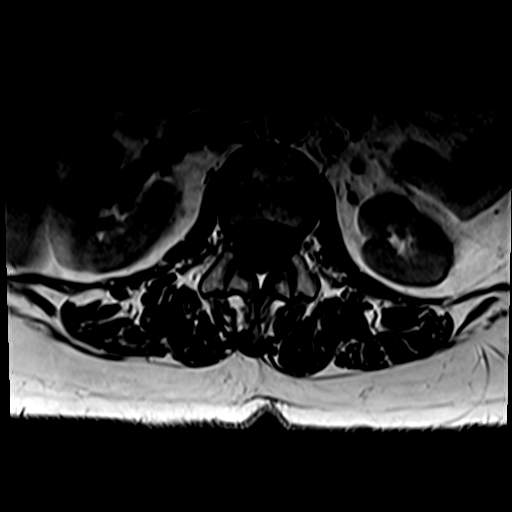
[im 30/36]
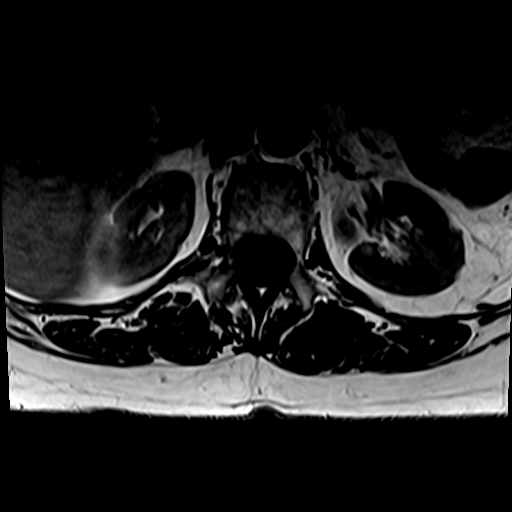
[im 36/36]
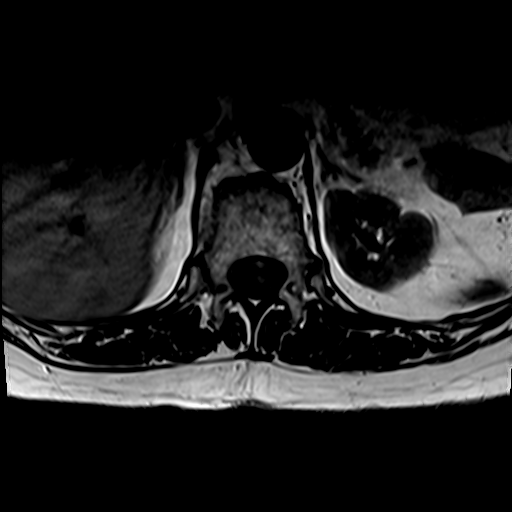

[31 of 48 positions shown; findings below may reference images not displayed]

FINDINGS: Segmentation:  Standard based on the available coverage

Alignment:  Slight anterolisthesis at L2-3 and L3-4.

Vertebrae:  No fracture, evidence of discitis, or bone lesion.

Conus medullaris and cauda equina: Conus extends to the L2 level.
Conus and cauda equina appear normal.

Paraspinal and other soft tissues: Negative

Disc levels:

T12- L1: Spondylosis with small protrusion.  No impingement

L1-L2: Unremarkable.

L2-L3: Mild disc narrowing and bulging. Mild left facet spurring. No
impingement

L3-L4: Facet spurring with slight anterolisthesis. Disc narrowing
and mild bulging. No impingement

L4-L5: Mild facet spurring. Mild disc narrowing and bulging. Small
superiorly migrating disc extrusion in the left foramen, without
visible neural contact.

L5-S1:Mild facet spurring.  No herniation or impingement.
IMPRESSION: 1. Overall mild degenerative changes as described above.
2. On the symptomatic left side there is a small foraminal disc
extrusion at L4-5 but no associated neural mass effect.

## 2019-02-05 ENCOUNTER — Encounter: Payer: Self-pay | Admitting: Gastroenterology

## 2019-02-05 ENCOUNTER — Ambulatory Visit (INDEPENDENT_AMBULATORY_CARE_PROVIDER_SITE_OTHER): Payer: Medicare HMO | Admitting: Gastroenterology

## 2019-02-05 ENCOUNTER — Other Ambulatory Visit: Payer: Self-pay

## 2019-02-05 DIAGNOSIS — R152 Fecal urgency: Secondary | ICD-10-CM

## 2019-02-05 DIAGNOSIS — R194 Change in bowel habit: Secondary | ICD-10-CM | POA: Diagnosis not present

## 2019-02-05 DIAGNOSIS — R197 Diarrhea, unspecified: Secondary | ICD-10-CM

## 2019-02-05 MED ORDER — LANSOPRAZOLE 30 MG PO CPDR: 30.0000 mg | DELAYED_RELEASE_CAPSULE | Freq: Two times a day (BID) | ORAL | 3 refills | Status: DC

## 2019-02-05 MED ORDER — DICYCLOMINE HCL 10 MG PO CAPS: 10.0000 mg | ORAL_CAPSULE | Freq: Three times a day (TID) | ORAL | 4 refills | Status: DC

## 2019-02-05 NOTE — Patient Instructions (Signed)
We have sent the following medications to your pharmacy for you to pick up at your convenience: Prevacid, dicyclomine  Start  Align extra strength once daily for 1 month.   If Align is not effective consider Bio-K+ strong or & UltraFlora Intensive.  Thank you for choosing me and [facility].  Pricilla Riffle. Dagoberto Ligas., MD., Marval Regal

## 2019-02-05 NOTE — Progress Notes (Signed)
    History of Present Illness: This is a 68 year old female who relates a change in bowel habits since January.  She relates a diarrheal illness in January with watery nonbloody urgent diarrhea multiple times per day for over 1 week followed by less frequent but persistent diarrhea for about 2 more weeks.  She relates a fever cough and congestion for 2 to 3 days prior to the onset of diarrhea.  Since then she has had intermittent urgent loose stools following meals and occasional incontinence.  She states the symptoms happen about 4-5 times per week they are not necessarily associated with any particular food or beverage.  Her reflux symptoms remain under good control on her current regimen.  No other gastrointestinal complaints.  Current Medications, Allergies, Past Medical History, Past Surgical History, Family History and Social History were reviewed in [employer] record.  Physical Exam: Telemedicine - not performed   Assessment and Recommendations:  1. Change in bowel habits with intermittent urgent, loose stools.  Suspected postinfectious IBS.  Begin dicyclomine 10 mg 3 times daily AC, 1 year of refills. Begin Microbiologist daily for 1 month. If not effective consider Bio-K+ Strong or UltraFlora Intensive Care 1-2 daily.  If symptoms persist consider further evaluation with stool studies, celiac testing, blood work, etc. REV in 1 month  2. Barrett's esophagus, short segment without dysplasia. LA Class B erosive esophagitis.  Refill Prevacid Solutabs 30 mg bid. Last 2 EGDs did not show Barrett's. Surveillance EGD in July 2022.  Offered option of a omeprazole or esomeprazole capsule opened with granules sprinkled in applesauce instead of Prevacid Solutabs for cost reasons however she prefers to remain on Solutabs.  3. Personal history of adenomatous colon polyps. Surveillance colonoscopy in July 2022.     These services were provided via telemedicine,  audio and video.  The patient was at home and the provider was in the office, alone.  We discussed the limitations of evaluation and management by telemedicine and the availability of in person appointments.  Patient consented for this telemedicine visit and is aware of possible charges for this service.  Office CMA or LPN participated in this telemedicine service.  Time spent on call: 17 minutes

## 2019-02-14 DIAGNOSIS — R69 Illness, unspecified: Secondary | ICD-10-CM | POA: Diagnosis not present

## 2019-02-19 ENCOUNTER — Other Ambulatory Visit: Payer: Self-pay | Admitting: Gastroenterology

## 2019-02-21 ENCOUNTER — Telehealth: Payer: Self-pay | Admitting: Gastroenterology

## 2019-02-21 NOTE — Telephone Encounter (Signed)
Spoke with pt and she is aware and knows to keep her OV as scheduled. 

## 2019-02-21 NOTE — Telephone Encounter (Signed)
Patient had a visit on 06/08 and wanted to f-up with her in 2 weeks to see how her med was going and to see if she should continue to take the meds. Dr. Fuller Plan did not have anything ava until 7-17 and she would like to know if she should continue the med.

## 2019-02-21 NOTE — Telephone Encounter (Signed)
Pt had telehealth visit earlier this month and was told to take align and dicyclomine and follow-up in a month. Pt is scheduled for a visit 03/16/19 but wants to know if she should continue the meds til her visit. She feels some better and states her stools are not as watery as they were, she is still having 1-2 BM's/day. She currently has an infected tooth and has been placed on penicillin for a couple of weeks. Please advise.

## 2019-02-21 NOTE — Telephone Encounter (Signed)
OK to discontinue dicyclomine and Align but no sooner than 1 week after completing PCN.  If symptoms recur then resume both.

## 2019-02-27 ENCOUNTER — Telehealth: Payer: Self-pay | Admitting: Family Medicine

## 2019-02-27 NOTE — Telephone Encounter (Signed)
Best number [phone] Pt called she just call [facility] on [address]  They told her she needs to have prior autho on zolpidem   They will be faxing med request shortly  Pt has 3 1/2 pills left

## 2019-02-27 NOTE — Telephone Encounter (Signed)
I am open to both medicines-please ask pt if she is willing to try one

## 2019-02-27 NOTE — Telephone Encounter (Signed)
Received PA form and it says pt has had to try and fail doxepin (3mg  or 6mg ) and trazodone, before they will approve the [place], they consider those 2 meds non-hight risk meds., PA will not be approved if pt hasn't tried the 2 meds and no documentation in chart shows pt has done so, please advise

## 2019-03-01 MED ORDER — TRAZODONE HCL 50 MG PO TABS: 50.0000 mg | ORAL_TABLET | Freq: Every evening | ORAL | 5 refills | Status: DC | PRN

## 2019-03-01 NOTE — Addendum Note (Signed)
Addended by: Loura Pardon A on: 03/01/2019 01:07 PM   Modules accepted: Orders

## 2019-03-01 NOTE — Telephone Encounter (Signed)
Pt notified of insurance response to PA. Pt said she will try whichever medication Dr. Glori Bickers thinks works best.  Suzie [address]

## 2019-03-01 NOTE — Telephone Encounter (Signed)
I sent trazodone Hope this works well

## 2019-03-01 NOTE — Telephone Encounter (Signed)
Pt returned your call and is requesting a cb

## 2019-03-01 NOTE — Telephone Encounter (Signed)
Left VM requesting pt to call the office back 

## 2019-03-05 ENCOUNTER — Telehealth: Payer: Self-pay

## 2019-03-05 NOTE — Telephone Encounter (Signed)
Left VM letting pt know Dr. Marliss Coots comments and instructions and requesting pt to check out doxepin and let us know what she wants to do

## 2019-03-05 NOTE — Telephone Encounter (Signed)
Per insurance she has to try doxepin and trazodone before they will pay for ambien   Have her look up doxepin to see what she thinks  Insurance did not indicate it would cover anything else It is also an antidepressant -but we tend to use for sleep instead of depression (similar to trazodone)   I won't send it in until she looks it up and tells [place] what she wants to do

## 2019-03-05 NOTE — Telephone Encounter (Signed)
Pt is concerned about taking trazodone due to possible interaction with dicyclomine 10 mg that pt is taking bid. Pt picked up trazodone but has not taken med due to warnings in information given to pt by pharmacy. Pt said not supposed to take with another HCI and Dicyclomine is HCI that makes pt sleepy. Pt also concerned that trazodone is usually given for depression and pt does not want her mood altered. Pt is also to have a tooth extracted next wk and literature indicated trazodone could cause bleeding. Pt said insurance no longer pays for [place] but pt would prefer med more similar to [place]. Pt request different med to [facility] [address]. Pt request cb after Dr Glori Bickers reviews note.

## 2019-03-12 DIAGNOSIS — R69 Illness, unspecified: Secondary | ICD-10-CM | POA: Diagnosis not present

## 2019-03-14 ENCOUNTER — Other Ambulatory Visit: Payer: Self-pay

## 2019-03-16 ENCOUNTER — Other Ambulatory Visit: Payer: Self-pay

## 2019-03-16 ENCOUNTER — Encounter: Payer: Self-pay | Admitting: Gastroenterology

## 2019-03-16 ENCOUNTER — Ambulatory Visit (INDEPENDENT_AMBULATORY_CARE_PROVIDER_SITE_OTHER): Payer: Medicare HMO | Admitting: Gastroenterology

## 2019-03-16 VITALS — Ht 65.0 in | Wt 160.0 lb

## 2019-03-16 DIAGNOSIS — R197 Diarrhea, unspecified: Secondary | ICD-10-CM

## 2019-03-16 NOTE — Progress Notes (Signed)
    History of Present Illness: This is a 68 year old female returning for change in bowel habits and looser stools.  While taking Align and dicyclomine her bowel habits returned to normal with 1-2 formed bowel movements per day.  Then she stopped dicyclomine and Align.  She then developed a dental problem and was placed on a course of antibiotics and subsequently underwent dental procedure with recommendations for a soft diet following the dental procedure.  Her stools have been looser and more frequent.  She is cleared to resume her prior regular diet this weekend.  No other gastrointestinal complaints.  Current Medications, Allergies, Past Medical History, Past Surgical History, Family History and Social History were reviewed in [employer] record.   Physical Exam: Telemedicine - not performed   Assessment and Recommendations:  1. Change in bowel habits with loose stools. Suspected resolving post infectious IBS. Change to soft diet post dental procedure and antibiotics may have caused recurrent symptoms. Resume your normal diet and if symptoms persist then resume dicyclomine 10 mg tid, ac and Align daily for 3-4 weeks and then may discontinue both.  If symptoms persist please contact my office.  REV prn.  2. GERD with LA class B erosive esophagitis.  Short segment Barrett's without dysplasia.  Continue Prevacid Solutabs 30 mg twice daily.  Surveillance EGD recommended in July 2022.  REV in 1 year.   3.  Personal history of adenomatous colon polyps.  Surveillance colonoscopy in July 2022.   These services were provided via telemedicine, audio and video initially however the video connection failed so the visit was complete by audio only.  The patient was at home and the provider was in the office, alone.  We discussed the limitations of evaluation and management by telemedicine and the availability of in person appointments.  Patient consented for this telemedicine visit  and is aware of possible charges for this service.  Office CMA or LPN participated in this telemedicine service.  Time spent on call: 8 minutes

## 2019-03-16 NOTE — Patient Instructions (Signed)
Resume your normal diet and if your symptoms persist then resume dicyclomine 10 mg three times a day before meals and Align daily for 3-4 weeks as needed.   Thank you for choosing me and [facility].  Pricilla Riffle. Dagoberto Ligas., MD., Marval Regal

## 2019-03-19 ENCOUNTER — Other Ambulatory Visit: Payer: Self-pay | Admitting: Family Medicine

## 2019-03-19 DIAGNOSIS — Z1231 Encounter for screening mammogram for malignant neoplasm of breast: Secondary | ICD-10-CM

## 2019-03-29 DIAGNOSIS — H5203 Hypermetropia, bilateral: Secondary | ICD-10-CM | POA: Diagnosis not present

## 2019-03-29 DIAGNOSIS — H524 Presbyopia: Secondary | ICD-10-CM | POA: Diagnosis not present

## 2019-03-29 DIAGNOSIS — H25013 Cortical age-related cataract, bilateral: Secondary | ICD-10-CM | POA: Diagnosis not present

## 2019-03-29 DIAGNOSIS — H2513 Age-related nuclear cataract, bilateral: Secondary | ICD-10-CM | POA: Diagnosis not present

## 2019-05-17 DIAGNOSIS — J3089 Other allergic rhinitis: Secondary | ICD-10-CM | POA: Diagnosis not present

## 2019-05-17 DIAGNOSIS — J4530 Mild persistent asthma, uncomplicated: Secondary | ICD-10-CM | POA: Diagnosis not present

## 2019-05-17 DIAGNOSIS — H1045 Other chronic allergic conjunctivitis: Secondary | ICD-10-CM | POA: Diagnosis not present

## 2019-05-17 DIAGNOSIS — J301 Allergic rhinitis due to pollen: Secondary | ICD-10-CM | POA: Diagnosis not present

## 2019-06-19 ENCOUNTER — Ambulatory Visit (INDEPENDENT_AMBULATORY_CARE_PROVIDER_SITE_OTHER): Payer: Medicare HMO

## 2019-06-19 DIAGNOSIS — Z23 Encounter for immunization: Secondary | ICD-10-CM | POA: Diagnosis not present

## 2019-07-03 DIAGNOSIS — R69 Illness, unspecified: Secondary | ICD-10-CM | POA: Diagnosis not present

## 2019-07-11 DIAGNOSIS — R69 Illness, unspecified: Secondary | ICD-10-CM | POA: Diagnosis not present

## 2019-07-19 ENCOUNTER — Ambulatory Visit
Admission: RE | Admit: 2019-07-19 | Discharge: 2019-07-19 | Disposition: A | Discharge status: Home / Self Care | Payer: Medicare HMO | Source: Ambulatory Visit | Attending: Family Medicine | Admitting: Family Medicine

## 2019-07-19 DIAGNOSIS — Z1231 Encounter for screening mammogram for malignant neoplasm of breast: Secondary | ICD-10-CM | POA: Insufficient documentation

## 2019-09-06 ENCOUNTER — Telehealth: Payer: Self-pay | Admitting: Gastroenterology

## 2019-09-06 NOTE — Telephone Encounter (Signed)
Called patient and number is busy.

## 2019-09-06 NOTE — Telephone Encounter (Signed)
Pt is looking for a similar medication to Prevacid. She stated that Prevacid has gotten too expensive so she would like to try similar but more affordable. She would like something that dissolvable. She needs a prescription for this new medication because she is running low on Prevacid.

## 2019-09-07 NOTE — Telephone Encounter (Signed)
Left a message for patient to return my call. 

## 2019-09-07 NOTE — Telephone Encounter (Signed)
Pt returned your call.  

## 2019-09-07 NOTE — Telephone Encounter (Signed)
Informed patient the best thing to do is call her insurance company and see what her preferred PPI is with them. Gave list of other PPI's so she can ask her insurance. Patient verbalized understanding and will contact them and call me back.

## 2019-09-17 ENCOUNTER — Telehealth: Payer: Self-pay

## 2019-09-17 NOTE — Telephone Encounter (Signed)
Received fax from [place] PA for Prevacid solutab is approved from 08/31/19-08-21-20.

## 2019-11-15 ENCOUNTER — Telehealth: Payer: Self-pay | Admitting: Gastroenterology

## 2019-11-15 NOTE — Telephone Encounter (Signed)
Patient called and would like to discuss changing her prevacid solutab medication.

## 2019-11-16 MED ORDER — OMEPRAZOLE 20 MG PO CPDR: 20.0000 mg | DELAYED_RELEASE_CAPSULE | Freq: Two times a day (BID) | ORAL | 5 refills | Status: DC

## 2019-11-16 NOTE — Telephone Encounter (Signed)
Patient states Prevacid solutab is getting to be too expensive. Patient states she did call her insurance company and found out that omeprazole 20 mg is covered. Patient decided to purchase the disintegrating tablets over the counter and take them twice daily x 6 weeks. Patient states the omeprazole is working very well but wonders if it would be cheaper sent like a prescription. Prescription sent to patient's pharmacy for omeprazole 20 mg twice daily. Informed patient to call me if this medication is too expensive or if she changes back to Prevacid. Patient verbalized understanding.

## 2019-11-20 ENCOUNTER — Telehealth: Payer: Self-pay | Admitting: Gastroenterology

## 2019-11-20 NOTE — Telephone Encounter (Signed)
Patient is calling to follow up and ask if you received the form for the PA

## 2019-11-20 NOTE — Telephone Encounter (Signed)
Received fax approval and coverage is approved from 08/31/19-08/29/20.

## 2019-11-20 NOTE — Telephone Encounter (Signed)
Spoke with [employer] and gave them ICD-10 for patients diagnosis and they approved the omeprazole twice daily til the end of the benefit year.

## 2019-12-19 ENCOUNTER — Telehealth: Payer: Self-pay

## 2019-12-19 MED ORDER — ZOLPIDEM TARTRATE 5 MG PO TABS: ORAL_TABLET | ORAL | 3 refills | Status: DC

## 2019-12-19 NOTE — Telephone Encounter (Signed)
Per DPR left VM letting pt know Rx sent to pharmacy and to use with caution

## 2019-12-19 NOTE — Telephone Encounter (Signed)
Pt left v/m requesting refill ambien 5 mg which was stopped previously; last annual on 01/08/19 and last refill of ambien # 30 x 3 on 01/08/19. Ambien was stopped previously but pt has been taking OTC meds for insomnia and pt said the OTC meds help some but pt does not like the "draggy " side effects she has the next day after taking the OTC sleep aides. Pt's insurance will do 3 refills of ambien per year. Pt request cb. Pt is scheuled for CPX on 01/09/20.

## 2019-12-19 NOTE — Telephone Encounter (Signed)
I sent it  Use with caution

## 2019-12-27 ENCOUNTER — Telehealth: Payer: Self-pay

## 2019-12-27 DIAGNOSIS — R69 Illness, unspecified: Secondary | ICD-10-CM | POA: Diagnosis not present

## 2019-12-27 NOTE — Telephone Encounter (Signed)
Good to know  Let me know when the PA decision is made

## 2019-12-27 NOTE — Telephone Encounter (Signed)
PA was submitted but just a FYI it will probably be denied it asked has pt tried and failed doxepin 3mg  and 6mg  (both strengths). I had to put no because she hasn't, not sure if PCP will change Rx to doxepin since PA will likely be denied

## 2019-12-27 NOTE — Telephone Encounter (Signed)
Pt was advised that zolpidem requires a PA. [employer] [place] [id number] Member ID [id number]

## 2019-12-28 MED ORDER — DOXEPIN HCL 6 MG PO TABS: 1.0000 | ORAL_TABLET | Freq: Every evening | ORAL | 3 refills | Status: DC | PRN

## 2019-12-28 NOTE — Telephone Encounter (Signed)
Please ask her if she wants to try doxepin

## 2019-12-28 NOTE — Telephone Encounter (Signed)
Pt said she will try the doxepin, [address] pt did state that her fear was that she will have the opposite effect on her like the trazodone did, Dr. Glori Bickers tried her on Trazodone last year and she couldn't go to sleep at all it made her feel hyper. I advised pt if the doxepin has the opposite effect on her or if it's ineffective then she can call us back and let us know because then we can try to do the PA again saying she has tried the alt med and it didn't work. Pt agrees with that plan and said we can send in the doxepin to [place]. And she will keep [place] posted

## 2019-12-28 NOTE — Telephone Encounter (Signed)
PA was denied due to pt not trying doxepin 3 mg or 6 mg, letter in your inbox for review

## 2019-12-28 NOTE — Telephone Encounter (Signed)
I sent it  

## 2020-01-03 ENCOUNTER — Telehealth: Payer: Self-pay | Admitting: Family Medicine

## 2020-01-03 DIAGNOSIS — E559 Vitamin D deficiency, unspecified: Secondary | ICD-10-CM

## 2020-01-03 DIAGNOSIS — Z0000 Encounter for general adult medical examination without abnormal findings: Secondary | ICD-10-CM

## 2020-01-03 DIAGNOSIS — E785 Hyperlipidemia, unspecified: Secondary | ICD-10-CM

## 2020-01-03 NOTE — Telephone Encounter (Signed)
-----   Message from Cloyd Stagers, RT sent at 12/21/2019  2:09 PM EDT ----- Regarding: Lab Orders for Friday 5.7.2021 Please place lab orders for Friday 5.7.2021, office visit for physical on Wednesday 5.12.2021 Thank you, Dyke Maes RT(R)

## 2020-01-04 ENCOUNTER — Other Ambulatory Visit: Payer: Self-pay

## 2020-01-04 ENCOUNTER — Other Ambulatory Visit (INDEPENDENT_AMBULATORY_CARE_PROVIDER_SITE_OTHER): Payer: Medicare HMO

## 2020-01-04 ENCOUNTER — Ambulatory Visit (INDEPENDENT_AMBULATORY_CARE_PROVIDER_SITE_OTHER): Payer: Medicare HMO

## 2020-01-04 VITALS — Wt 166.0 lb

## 2020-01-04 DIAGNOSIS — Z0000 Encounter for general adult medical examination without abnormal findings: Secondary | ICD-10-CM

## 2020-01-04 DIAGNOSIS — E785 Hyperlipidemia, unspecified: Secondary | ICD-10-CM | POA: Diagnosis not present

## 2020-01-04 DIAGNOSIS — E559 Vitamin D deficiency, unspecified: Secondary | ICD-10-CM | POA: Diagnosis not present

## 2020-01-04 LAB — CBC WITH DIFFERENTIAL/PLATELET
Basophils Absolute: 0 10*3/uL (ref 0.0–0.1)
Basophils Relative: 0.6 % (ref 0.0–3.0)
Eosinophils Absolute: 0.8 10*3/uL — ABNORMAL HIGH (ref 0.0–0.7)
Eosinophils Relative: 11.3 % — ABNORMAL HIGH (ref 0.0–5.0)
HCT: 36.8 % (ref 36.0–46.0)
Hemoglobin: 12.2 g/dL (ref 12.0–15.0)
Lymphocytes Relative: 28.5 % (ref 12.0–46.0)
Lymphs Abs: 1.9 10*3/uL (ref 0.7–4.0)
MCHC: 33.3 g/dL (ref 30.0–36.0)
MCV: 84.2 fl (ref 78.0–100.0)
Monocytes Absolute: 0.4 10*3/uL (ref 0.1–1.0)
Monocytes Relative: 6.4 % (ref 3.0–12.0)
Neutro Abs: 3.5 10*3/uL (ref 1.4–7.7)
Neutrophils Relative %: 53.2 % (ref 43.0–77.0)
Platelets: 245 10*3/uL (ref 150.0–400.0)
RBC: 4.37 Mil/uL (ref 3.87–5.11)
RDW: 14.3 % (ref 11.5–15.5)
WBC: 6.7 10*3/uL (ref 4.0–10.5)

## 2020-01-04 LAB — COMPREHENSIVE METABOLIC PANEL
ALT: 10 U/L (ref 0–35)
AST: 13 U/L (ref 0–37)
Albumin: 4.1 g/dL (ref 3.5–5.2)
Alkaline Phosphatase: 78 U/L (ref 39–117)
BUN: 9 mg/dL (ref 6–23)
CO2: 31 mEq/L (ref 19–32)
Calcium: 9.7 mg/dL (ref 8.4–10.5)
Chloride: 105 mEq/L (ref 96–112)
Creatinine, Ser: 0.78 mg/dL (ref 0.40–1.20)
GFR: 73.4 mL/min (ref >60.00)
Glucose, Bld: 89 mg/dL (ref 70–99)
Potassium: 4.5 mEq/L (ref 3.5–5.1)
Sodium: 139 mEq/L (ref 135–145)
Total Bilirubin: 0.7 mg/dL (ref 0.2–1.2)
Total Protein: 7.2 g/dL (ref 6.0–8.3)

## 2020-01-04 LAB — LIPID PANEL
Cholesterol: 216 mg/dL — ABNORMAL HIGH (ref 0–200)
HDL: 69.2 mg/dL (ref >39.00)
LDL Cholesterol: 128 mg/dL — ABNORMAL HIGH (ref 0–99)
NonHDL: 146.96
Total CHOL/HDL Ratio: 3
Triglycerides: 97 mg/dL (ref 0.0–149.0)
VLDL: 19.4 mg/dL (ref 0.0–40.0)

## 2020-01-04 LAB — TSH: TSH: 2.15 u[IU]/mL (ref 0.35–4.50)

## 2020-01-04 LAB — VITAMIN D 25 HYDROXY (VIT D DEFICIENCY, FRACTURES): VITD: 33.06 ng/mL (ref 30.00–100.00)

## 2020-01-04 NOTE — Progress Notes (Signed)
Subjective:   Jenny Mcfarland is a 69 y.o. female who presents for Medicare Annual (Subsequent) preventive examination.  Review of Systems: N/A   This visit is being conducted through telemedicine via telephone at the nurse health advisor's home address due to the COVID-19 pandemic. This patient has given me verbal consent via doximity to conduct this visit, patient states they are participating from their home address. Patient and myself are on the telephone call. There is no referral for this visit. Some vital signs may be absent or patient reported.    Patient identification: identified by name, DOB, and current address   Cardiac Risk Factors include: advanced age (>52men, >26 women);dyslipidemia     Objective:     Vitals: Wt 166 lb (75.3 kg)   BMI 27.62 kg/m   Body mass index is 27.62 kg/m.  Advanced Directives 01/04/2020 01/01/2019 12/22/2017 11/04/2017 09/09/2017 09/02/2017  Does Patient Have a Medical Advance Directive? Yes Yes Yes Yes Yes Yes  Type of Paramedic of [place];Living will [place];Living will Living will;Healthcare Power of [place];Living will [place];Living will -  Does patient want to make changes to medical advance directive? - - - - Yes (MAU/Ambulatory/Procedural Areas - Information given) -  Copy of [place] in Chart? Yes - validated most recent copy scanned in chart (See row information) No - copy requested No - copy requested Yes Yes -    Tobacco Social History   Tobacco Use  Smoking Status Never Smoker  Smokeless Tobacco Never Used     Counseling given: Not Answered   Clinical Intake:  Pre-visit preparation completed: Yes  Pain : No/denies pain     Nutritional Risks: None Diabetes: No  How often do you need to have someone help you when you read instructions, pamphlets, or other written materials from your doctor or pharmacy?:  1 - Never What is the last grade level you completed in school?: 12th  Interpreter Needed?: No  Information entered by :: CJohnson, LPN  Past Medical History:  Diagnosis Date  . Allergic rhinitis   . Asthma    WELL CONTROLLED  . Barrett's esophagus 2005  . Complication of anesthesia   . GERD (gastroesophageal reflux disease) 11/2008  . Hemorrhoids   . Hiatal hernia   . History of chicken pox   . IBS (irritable bowel syndrome)   . Osteopenia   . Peptic stricture of esophagus 11-2008  . PONV (postoperative nausea and vomiting)    N/V WITH GALLBLADDER  . Recurrent sinusitis    Past Surgical History:  Procedure Laterality Date  . BREAST BIOPSY Right 07/26/2017   INTRADUCTAL PAPILLOMA.   Marland Kitchen BREAST LUMPECTOMY Right 08/2017  . BREAST LUMPECTOMY WITH NEEDLE LOCALIZATION Right 09/09/2017   Procedure: BREAST LUMPECTOMY WITH NEEDLE LOCALIZATION;  Surgeon: Leonie Green, MD;  Location: [facility] ORS;  Service: General;  Laterality: Right;  . CHOLECYSTECTOMY  2010  . TUBAL LIGATION     Family History  Problem Relation Age of Onset  . Aortic aneurysm Mother   . Heart disease Mother   . Heart attack Brother        early 21's  . Arthritis Brother   . Breast cancer Sister 19  . Colon cancer Neg Hx    Social History   Socioeconomic History  . Marital status: Single    Spouse name: Not on file  . Number of children: 2  . Years of  education: Not on file  . Highest education level: Not on file  Occupational History  . 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 Smoking status: Never Smoker  . Smokeless tobacco: Never Used  Substance and Sexual Activity  . Alcohol use: Yes    Alcohol/week: 0.0 standard drinks    Comment: RARE  . Drug use: No  . Sexual activity: Not Currently  Other Topics Concern  . Not on file  Social History Narrative   Daily caffeine use - soft drinks/tea       Separating from husband 1/17   Social Determinants of Health   Financial  Resource Strain: Low Risk   . Difficulty of Paying Living Expenses: Not hard at all  Food Insecurity: No Food Insecurity  . Worried About Charity fundraiser in the Last Year: Never true  . Ran Out of Food in the Last Year: Never true  Transportation Needs: No Transportation Needs  . Lack of Transportation (Medical): No  . Lack of Transportation (Non-Medical): No  Physical Activity: Insufficiently Active  . Days of Exercise per Week: 3 days  . Minutes of Exercise per Session: 20 min  Stress: No Stress Concern Present  . Feeling of Stress : Not at all  Social Connections:   . Frequency of Communication with Friends and Family:   . Frequency of Social Gatherings with Friends and Family:   . Attends Religious Services:   . Active Member of Clubs or Organizations:   . Attends Archivist Meetings:   Marland Kitchen Marital Status:     Outpatient Encounter Medications as of 01/04/2020  Medication Sig  . cholecalciferol (VITAMIN D) 25 MCG (1000 UT) tablet Take 3,000 Units by mouth daily.  . Doxepin HCl 6 MG TABS Take 1 tablet (6 mg total) by mouth at bedtime as needed.  . fexofenadine (ALLEGRA) 180 MG tablet Take 180 mg by mouth every morning.   . Fluticasone-Salmeterol (ADVAIR DISKUS) 250-50 MCG/DOSE AEPB Inhale 1 puff into the lungs 2 (two) times daily as needed. (Patient taking differently: Inhale 1 puff into the lungs 2 (two) times daily. )  . levalbuterol (XOPENEX HFA) 45 MCG/ACT inhaler Inhale 1 puff into the lungs every 4 (four) hours as needed for wheezing.  Marland Kitchen omeprazole (PRILOSEC) 20 MG capsule Take 1 capsule (20 mg total) by mouth 2 (two) times daily before a meal.  . zolpidem (AMBIEN) 5 MG tablet TAKE 1/2 TO 1 (ONE-HALF TO ONE) TABLET BY MOUTH AT BEDTIME AS NEEDED   No facility-administered encounter medications on file as of 01/04/2020.    Activities of Daily Living In your present state of health, do you have any difficulty performing the following activities: 01/04/2020  Hearing? Y   Comment slight hearing issues (certain tones)  Vision? N  Difficulty concentrating or making decisions? N  Walking or climbing stairs? N  Dressing or bathing? N  Doing errands, shopping? N  Preparing Food and eating ? N  Using the Toilet? N  In the past six months, have you accidently leaked urine? N  Do you have problems with loss of bowel control? N  Managing your Medications? N  Managing your Finances? N  Housekeeping or managing your Housekeeping? N  Some recent data might be hidden    Patient Care Team: Tower, [name], MD as PCP - General (Family Medicine) Ladene Artist, MD as Consulting Physician (Gastroenterology)    Assessment:   This is a routine wellness examination for [place].  Exercise  Activities and Dietary recommendations Current Exercise Habits: Home exercise routine, Type of exercise: walking, Time (Minutes): 20, Frequency (Times/Week): 3, Weekly Exercise (Minutes/Week): 60, Intensity: Moderate, Exercise limited by: None identified  Goals    . Increase physical activity     Starting 01/01/19, I will continue to walk at least 20 minutes 3 days per week.     . Patient Stated     01/04/2020, I will continue to walk at least 20 minutes 3 days per week.       Fall Risk Fall Risk  01/04/2020 01/01/2019 12/22/2017 06/17/2017 12/21/2016  Falls in the past year? 0 0 No No No  Number falls in past yr: 0 - - - -  Injury with Fall? 0 - - - -  Risk for fall due to : No Fall Risks - - - -  Follow up Falls evaluation completed;Falls prevention discussed - - - -   Is the patient's home free of loose throw rugs in walkways, pet beds, electrical cords, etc?   yes      Grab bars in the bathroom? no      Handrails on the stairs?   yes      Adequate lighting?   yes  Timed Get Up and Go performed: N/A  Depression Screen PHQ 2/9 Scores 01/04/2020 01/01/2019 12/22/2017 06/17/2017  PHQ - 2 Score 0 0 0 0  PHQ- 9 Score 0 0 0 -     Cognitive Function MMSE - Mini Mental State Exam  01/04/2020 01/01/2019 12/22/2017  Orientation to time 5 5 5   Orientation to Place 5 5 5   Registration 3 3 3   Attention/ Calculation 5 0 0  Recall 3 3 3   Language- name 2 objects - 0 0  Language- repeat 1 1 1   Language- follow 3 step command - 0 3  Language- read & follow direction - 0 0  Write a sentence - 0 0  Copy design - 0 0  Total score - 17 20  Mini Cog  Mini-Cog screen was completed. Maximum score is 22. A value of 0 denotes this part of the MMSE was not completed or the patient failed this part of the Mini-Cog screening.       Immunization History  Administered Date(s) Administered  . Influenza Split 06/21/2012  . Influenza,inj,Quad PF,6+ Mos 06/13/2013, 07/01/2014, 06/16/2015, 06/25/2016, 06/17/2017, 06/29/2018, 06/19/2019  . PFIZER SARS-COV-2 Vaccination 10/10/2019, 10/31/2019  . Pneumococcal Conjugate-13 12/21/2016  . Tdap 05/25/2011    Qualifies for Shingles Vaccine: yes  Screening Tests Health Maintenance  Topic Date Due  . PNA vac Low Risk Adult (2 of 2 - PPSV23) 08/29/2020 (Originally 12/21/2017)  . Hepatitis C Screening  10/01/2020 (Originally 03-31-1951)  . INFLUENZA VACCINE  03/30/2020  . DEXA SCAN  07/17/2020  . MAMMOGRAM  07/18/2020  . COLONOSCOPY  03/12/2021  . TETANUS/TDAP  05/24/2021  . COVID-19 Vaccine  Completed    Cancer Screenings: Lung: Low Dose CT Chest recommended if Age 12-80 years, 30 pack-year currently smoking OR have quit w/in 15 years. Patient does not qualify. Breast:  Up to date on Mammogram: Yes, completed 07/19/2019   Up to date of Bone Density/Dexa: Yes, completed 07/17/2018 Colorectal: completed 03/12/2016  Additional Screenings:  Hepatitis C Screening: decline     Plan:    Patient will continue to walk at least 20 minutes 3 days per week.   I have personally reviewed and noted the following in the patient's chart:   . Medical and social history .  Use of alcohol, tobacco or illicit drugs  . Current medications and  supplements . Functional ability and status . Nutritional status . Physical activity . Advanced directives . List of other physicians . Hospitalizations, surgeries, and ER visits in previous 12 months . Vitals . Screenings to include cognitive, depression, and falls . Referrals and appointments  In addition, I have reviewed and discussed with patient certain preventive protocols, quality metrics, and best practice recommendations. A written personalized care plan for preventive services as well as general preventive health recommendations were provided to patient.     Andrez Grime, LPN  [phone]

## 2020-01-04 NOTE — Progress Notes (Addendum)
PCP notes:  Health Maintenance: No gaps noted   Abnormal Screenings: none   Patient concerns: none   Nurse concerns: none   Next PCP appt.: 01/09/2020 @ 3:30 pm  I reviewed health advisor's note, was available for consultation, and agree with documentation and plan. Loura Pardon MD

## 2020-01-04 NOTE — Patient Instructions (Signed)
Ms. Jenny Mcfarland , Thank you for taking time to come for your Medicare Wellness Visit. I appreciate your ongoing commitment to your health goals. Please review the following plan we discussed and let me know if I can assist you in the future.   Screening recommendations/referrals: Colonoscopy: Up to date, completed 03/12/2016 Mammogram: Up to date, completed 07/19/2019 Bone Density: Up to date, completed 07/17/2018 Recommended yearly ophthalmology/optometry visit for glaucoma screening and checkup Recommended yearly dental visit for hygiene and checkup  Vaccinations: Influenza vaccine: Up to date, completed 06/19/2019 Pneumococcal vaccine: decline Tdap vaccine: Up to date, completed 05/25/2011 Shingles vaccine: discussed    Advanced directives: copy in chart  Conditions/risks identified: hyperlipidemia  Next appointment: 01/09/2020 @ 3:30 pm    Preventive Care 68 Years and Older, Female Preventive care refers to lifestyle choices and visits with your health care provider that can promote health and wellness. What does preventive care include?  A yearly physical exam. This is also called an annual well check.  Dental exams once or twice a year.  Routine eye exams. Ask your health care provider how often you should have your eyes checked.  Personal lifestyle choices, including:  Daily care of your teeth and gums.  Regular physical activity.  Eating a healthy diet.  Avoiding tobacco and drug use.  Limiting alcohol use.  Practicing safe sex.  Taking low-dose aspirin every day.  Taking vitamin and mineral supplements as recommended by your health care provider. What happens during an annual well check? The services and screenings done by your health care provider during your annual well check will depend on your age, overall health, lifestyle risk factors, and family history of disease. Counseling  Your health care provider may ask you questions about your:  Alcohol  use.  Tobacco use.  Drug use.  Emotional well-being.  Home and relationship well-being.  Sexual activity.  Eating habits.  History of falls.  Memory and ability to understand (cognition).  Work and work Statistician.  Reproductive health. Screening  You may have the following tests or measurements:  Height, weight, and BMI.  Blood pressure.  Lipid and cholesterol levels. These may be checked every 5 years, or more frequently if you are over 36 years old.  Skin check.  Lung cancer screening. You may have this screening every year starting at age 68 if you have a 30-pack-year history of smoking and currently smoke or have quit within the past 15 years.  Fecal occult blood test (FOBT) of the stool. You may have this test every year starting at age 68.  Flexible sigmoidoscopy or colonoscopy. You may have a sigmoidoscopy every 5 years or a colonoscopy every 10 years starting at age 68.  Hepatitis C blood test.  Hepatitis B blood test.  Sexually transmitted disease (STD) testing.  Diabetes screening. This is done by checking your blood sugar (glucose) after you have not eaten for a while (fasting). You may have this done every 1-3 years.  Bone density scan. This is done to screen for osteoporosis. You may have this done starting at age 68.  Mammogram. This may be done every 1-2 years. Talk to your health care provider about how often you should have regular mammograms. Talk with your health care provider about your test results, treatment options, and if necessary, the need for more tests. Vaccines  Your health care provider may recommend certain vaccines, such as:  Influenza vaccine. This is recommended every year.  Tetanus, diphtheria, and acellular pertussis (Tdap, Td) vaccine.  You may need a Td booster every 10 years.  Zoster vaccine. You may need this after age 68.  Pneumococcal 13-valent conjugate (PCV13) vaccine. One dose is recommended after age  37.  Pneumococcal polysaccharide (PPSV23) vaccine. One dose is recommended after age 68. Talk to your health care provider about which screenings and vaccines you need and how often you need them. This information is not intended to replace advice given to you by your health care provider. Make sure you discuss any questions you have with your health care provider. Document Released: 09/12/2015 Document Revised: 05/05/2016 Document Reviewed: 06/17/2015 Elsevier Interactive Patient Education  2017 [place] Prevention in the Home Falls can cause injuries. They can happen to people of all ages. There are many things you can do to make your home safe and to help prevent falls. What can I do on the outside of my home?  Regularly fix the edges of walkways and driveways and fix any cracks.  Remove anything that might make you trip as you walk through a door, such as a raised step or threshold.  Trim any bushes or trees on the path to your home.  Use bright outdoor lighting.  Clear any walking paths of anything that might make someone trip, such as rocks or tools.  Regularly check to see if handrails are loose or broken. Make sure that both sides of any steps have handrails.  Any raised decks and porches should have guardrails on the edges.  Have any leaves, snow, or ice cleared regularly.  Use sand or salt on walking paths during winter.  Clean up any spills in your garage right away. This includes oil or grease spills. What can I do in the bathroom?  Use night lights.  Install grab bars by the toilet and in the tub and shower. Do not use towel bars as grab bars.  Use non-skid mats or decals in the tub or shower.  If you need to sit down in the shower, use a plastic, non-slip stool.  Keep the floor dry. Clean up any water that spills on the floor as soon as it happens.  Remove soap buildup in the tub or shower regularly.  Attach bath mats securely with double-sided  non-slip rug tape.  Do not have throw rugs and other things on the floor that can make you trip. What can I do in the bedroom?  Use night lights.  Make sure that you have a light by your bed that is easy to reach.  Do not use any sheets or blankets that are too big for your bed. They should not hang down onto the floor.  Have a firm chair that has side arms. You can use this for support while you get dressed.  Do not have throw rugs and other things on the floor that can make you trip. What can I do in the kitchen?  Clean up any spills right away.  Avoid walking on wet floors.  Keep items that you use a lot in easy-to-reach places.  If you need to reach something above you, use a strong step stool that has a grab bar.  Keep electrical cords out of the way.  Do not use floor polish or wax that makes floors slippery. If you must use wax, use non-skid floor wax.  Do not have throw rugs and other things on the floor that can make you trip. What can I do with my stairs?  Do not leave any  items on the stairs.  Make sure that there are handrails on both sides of the stairs and use them. Fix handrails that are broken or loose. Make sure that handrails are as long as the stairways.  Check any carpeting to make sure that it is firmly attached to the stairs. Fix any carpet that is loose or worn.  Avoid having throw rugs at the top or bottom of the stairs. If you do have throw rugs, attach them to the floor with carpet tape.  Make sure that you have a light switch at the top of the stairs and the bottom of the stairs. If you do not have them, ask someone to add them for you. What else can I do to help prevent falls?  Wear shoes that:  Do not have high heels.  Have rubber bottoms.  Are comfortable and fit you well.  Are closed at the toe. Do not wear sandals.  If you use a stepladder:  Make sure that it is fully opened. Do not climb a closed stepladder.  Make sure that both  sides of the stepladder are locked into place.  Ask someone to hold it for you, if possible.  Clearly mark and make sure that you can see:  Any grab bars or handrails.  First and last steps.  Where the edge of each step is.  Use tools that help you move around (mobility aids) if they are needed. These include:  Canes.  Walkers.  Scooters.  Crutches.  Turn on the lights when you go into a dark area. Replace any light bulbs as soon as they burn out.  Set up your furniture so you have a clear path. Avoid moving your furniture around.  If any of your floors are uneven, fix them.  If there are any pets around you, be aware of where they are.  Review your medicines with your doctor. Some medicines can make you feel dizzy. This can increase your chance of falling. Ask your doctor what other things that you can do to help prevent falls. This information is not intended to replace advice given to you by your health care provider. Make sure you discuss any questions you have with your health care provider. Document Released: 06/12/2009 Document Revised: 01/22/2016 Document Reviewed: 09/20/2014 Elsevier Interactive Patient Education  2017 [employer].

## 2020-01-06 ENCOUNTER — Emergency Department
Admission: EM | Admit: 2020-01-06 | Discharge: 2020-01-06 | Disposition: A | Discharge status: Home / Self Care | Payer: Medicare HMO | Attending: Emergency Medicine | Admitting: Emergency Medicine

## 2020-01-06 ENCOUNTER — Other Ambulatory Visit: Payer: Self-pay

## 2020-01-06 ENCOUNTER — Encounter: Payer: Self-pay | Admitting: Emergency Medicine

## 2020-01-06 DIAGNOSIS — J45909 Unspecified asthma, uncomplicated: Secondary | ICD-10-CM | POA: Insufficient documentation

## 2020-01-06 DIAGNOSIS — Z79899 Other long term (current) drug therapy: Secondary | ICD-10-CM | POA: Insufficient documentation

## 2020-01-06 DIAGNOSIS — M545 Low back pain: Secondary | ICD-10-CM | POA: Diagnosis present

## 2020-01-06 DIAGNOSIS — Z9049 Acquired absence of other specified parts of digestive tract: Secondary | ICD-10-CM | POA: Insufficient documentation

## 2020-01-06 DIAGNOSIS — M5442 Lumbago with sciatica, left side: Secondary | ICD-10-CM

## 2020-01-06 MED ORDER — OXYCODONE-ACETAMINOPHEN 5-325 MG PO TABS
1.0000 | ORAL_TABLET | Freq: Once | ORAL | Status: AC
  Administered 2020-01-06: 1 via ORAL
  Filled 2020-01-06: qty 1

## 2020-01-06 MED ORDER — METHOCARBAMOL 500 MG PO TABS: 500.0000 mg | ORAL_TABLET | Freq: Four times a day (QID) | ORAL | 0 refills | Status: DC

## 2020-01-06 MED ORDER — PREDNISONE 10 MG PO TABS: ORAL_TABLET | ORAL | 0 refills | Status: DC

## 2020-01-06 MED ORDER — ONDANSETRON 4 MG PO TBDP
4.0000 mg | ORAL_TABLET | Freq: Once | ORAL | Status: AC
  Administered 2020-01-06: 4 mg via ORAL
  Filled 2020-01-06: qty 1

## 2020-01-06 MED ORDER — ORPHENADRINE CITRATE 30 MG/ML IJ SOLN
30.0000 mg | Freq: Two times a day (BID) | INTRAMUSCULAR | Status: DC
  Administered 2020-01-06: 30 mg via INTRAMUSCULAR
  Filled 2020-01-06: qty 2

## 2020-01-06 MED ORDER — OXYCODONE-ACETAMINOPHEN 5-325 MG PO TABS: 1.0000 | ORAL_TABLET | Freq: Four times a day (QID) | ORAL | 0 refills | Status: DC | PRN

## 2020-01-06 NOTE — Discharge Instructions (Addendum)
Follow-up with your primary care provider if any continued problems or concerns.  Return to the emergency department if any severe worsening of your symptoms such as loss of bowel and bladder control.  Begin taking Percocet 1 every 6 hours as needed for severe pain, Robaxin as needed for muscle spasms and begin taking the prednisone with food as directed beginning with 6 tablets today.  You may also use ice or heat to your back as needed.

## 2020-01-06 NOTE — ED Provider Notes (Signed)
[facility] Emergency Department Provider Note  ____________________________________________   First MD Initiated Contact with Patient 01/06/20 [phone]     (approximate)  I have reviewed the triage vital signs and the nursing notes.   HISTORY  Chief Complaint Back Pain    HPI Jenny Mcfarland is a 69 y.o. female presents to the ED with complaint of low back pain with radiation into her left arm.  Patient states that she has had some nausea and vomiting due to pain since that time.  Patient states that she was bending over to put the leash on her dog and had pain.  She has had a history of sciatica in the past to the same hip.  Patient took etodolac last evening without any relief of her pain.  She denies any direct trauma to her hip during this event and sit down soon after.  She denies any urinary or bladder incontinence.  Patient describes a "pulling sensation".  Currently she rates her pain as an 8 out of 10.     Past Medical History:  Diagnosis Date  . Allergic rhinitis   . Asthma    WELL CONTROLLED  . Barrett's esophagus 2005  . Complication of anesthesia   . GERD (gastroesophageal reflux disease) 11/2008  . Hemorrhoids   . Hiatal hernia   . History of chicken pox   . IBS (irritable bowel syndrome)   . Osteopenia   . Peptic stricture of esophagus 11-2008  . PONV (postoperative nausea and vomiting)    N/V WITH GALLBLADDER  . Recurrent sinusitis     Patient Active Problem List   Diagnosis Date Noted  . Low back pain 04/03/2018  . Plantar wart of left foot 04/03/2018  . Welcome to Medicare preventive visit 12/21/2016  . Estrogen deficiency 06/04/2015  . Vitamin D deficiency 09/23/2014  . Thoracic or lumbosacral neuritis or radiculitis, unspecified 05/21/2013  . Gynecological examination 05/25/2011  . Other screening mammogram 05/25/2011  . Routine general medical examination at a health care facility 05/25/2011  . Osteopenia 12/27/2010  .  IBS (irritable bowel syndrome) 12/25/2010  . Allergic rhinitis 12/25/2010  . Asthma 12/25/2010  . Mild hyperlipidemia 12/25/2010  . Family history of breast cancer 12/25/2010  . Family history of aortic aneurysm 12/25/2010  . GERD 11/08/2008  . BARRETT'S ESOPHAGUS 11/08/2008    Past Surgical History:  Procedure Laterality Date  . BREAST BIOPSY Right 07/26/2017   INTRADUCTAL PAPILLOMA.   Marland Kitchen BREAST LUMPECTOMY Right 08/2017  . BREAST LUMPECTOMY WITH NEEDLE LOCALIZATION Right 09/09/2017   Procedure: BREAST LUMPECTOMY WITH NEEDLE LOCALIZATION;  Surgeon: Leonie Green, MD;  Location: [facility] ORS;  Service: General;  Laterality: Right;  . CHOLECYSTECTOMY  2010  . TUBAL LIGATION      Prior to Admission medications   Medication Sig Start Date End Date Taking? Authorizing Provider  cholecalciferol (VITAMIN D) 25 MCG (1000 UT) tablet Take 3,000 Units by mouth daily.    [provider]  Doxepin HCl 6 MG TABS Take 1 tablet (6 mg total) by mouth at bedtime as needed. 12/28/19   Tower, [name], MD  fexofenadine (ALLEGRA) 180 MG tablet Take 180 mg by mouth every morning.     [provider]  Fluticasone-Salmeterol (ADVAIR DISKUS) 250-50 MCG/DOSE AEPB Inhale 1 puff into the lungs 2 (two) times daily as needed. Patient taking differently: Inhale 1 puff into the lungs 2 (two) times daily.  12/21/16   Tower, [name], MD  levalbuterol [facility] HFA)  45 MCG/ACT inhaler Inhale 1 puff into the lungs every 4 (four) hours as needed for wheezing.    [provider]  methocarbamol (ROBAXIN) 500 MG tablet Take 1 tablet (500 mg total) by mouth 4 (four) times daily. 01/06/20   Johnn Hai, PA-C  omeprazole (PRILOSEC) 20 MG capsule Take 1 capsule (20 mg total) by mouth 2 (two) times daily before a meal. 11/16/19   Ladene Artist, MD  oxyCODONE-acetaminophen (PERCOCET) 5-325 MG tablet Take 1 tablet by mouth every 6 (six) hours as needed for severe pain. 01/06/20 01/05/21  Johnn Hai,  PA-C  predniSONE (DELTASONE) 10 MG tablet Take 6 tablets  today, on day 2 take 5 tablets, day 3 take 4 tablets, day 4 take 3 tablets, day 5 take  2 tablets and 1 tablet the last day 01/06/20   Johnn Hai, PA-C  zolpidem (AMBIEN) 5 MG tablet TAKE 1/2 TO 1 (ONE-HALF TO ONE) TABLET BY MOUTH AT BEDTIME AS NEEDED 12/19/19   Tower, [name], MD    Allergies Levofloxacin and Tessalon [benzonatate]  Family History  Problem Relation Age of Onset  . Aortic aneurysm Mother   . Heart disease Mother   . Heart attack Brother        early 46's  . Arthritis Brother   . Breast cancer Sister 99  . Colon cancer Neg Hx     Social History Social History   Tobacco Use  . Smoking status: Never Smoker  . Smokeless tobacco: Never Used  Substance Use Topics  . Alcohol use: Yes    Alcohol/week: 0.0 standard drinks    Comment: RARE  . Drug use: No    Review of Systems Constitutional: No fever/chills Eyes: No visual changes. ENT: No complaints. Cardiovascular: Denies chest pain. Respiratory: Denies shortness of breath. Gastrointestinal: No abdominal pain.  Positive nausea, no vomiting.  Genitourinary: Negative for dysuria. Musculoskeletal: Positive history for low back pain and left leg sciatica. Skin: Negative for rash. Neurological: Negative for headaches, focal weakness or numbness.   ____________________________________________   PHYSICAL EXAM:  VITAL SIGNS: ED Triage Vitals  Enc Vitals Group     BP 01/06/20 0912 (!) 158/72     Pulse Rate 01/06/20 0912 75     Resp 01/06/20 0912 18     Temp 01/06/20 0912 98.8 F (37.1 C)     Temp Source 01/06/20 0912 Oral     SpO2 01/06/20 0912 99 %     Weight 01/06/20 0917 165 lb (74.8 kg)     Height 01/06/20 0917 5' 4.5" (1.638 m)     Head Circumference --      Peak Flow --      Pain Score 01/06/20 0917 8     Pain Loc --      Pain Edu? --      Excl. in [place]? --     Constitutional: Alert and oriented. Well appearing and in no acute  distress. Eyes: Conjunctivae are normal. PERRL. EOMI. Head: Atraumatic. Neck: No stridor.   Cardiovascular: Normal rate, regular rhythm. Grossly normal heart sounds.  Good peripheral circulation. Respiratory: Normal respiratory effort.  No retractions. Lungs CTAB. Gastrointestinal: Soft and nontender.  Musculoskeletal: No tenderness on palpation of the thoracic or lumbar spine.  There is marked tenderness on palpation of the left SI joint area and patient was unable to tolerate deep palpation.  Range of motion is guarded secondary to muscle spasms.  Good muscle strength bilaterally.  Straight leg raises on  the right are negative at 90 degrees, left leg at approximately 40 degrees reproduces her pain. Neurologic:  Normal speech and language.  Reflexes distally are 2+ bilaterally.  No gross focal neurologic deficits are appreciated.  Skin:  Skin is warm, dry and intact. No rash noted. Psychiatric: Mood and affect are normal. Speech and behavior are normal.  ____________________________________________   LABS (all labs ordered are listed, but only abnormal results are displayed)  Labs Reviewed - No data to display  PROCEDURES  Procedure(s) performed (including Critical Care):  Procedures   ____________________________________________   INITIAL IMPRESSION / ASSESSMENT AND PLAN / ED COURSE  As part of my medical decision making, I reviewed the following data within the electronic MEDICAL RECORD NUMBER Notes from prior ED visits and  Controlled Substance Database  69 year old female presents to the ED with complaint of low back pain that radiated over to her left hip after she bent down to put a leash on her dog.  Patient states that she has had problems with sciatica in the past with her left lower extremity.  Patient denies any direct trauma during this event.  Physical exam is consistent and also patient is having active muscle spasms preventing her from range of motion.  She was given  Norflex 30 mg IM and Percocet which she tolerated well.  Prior to discharge she was moving much better.  A prescription for methocarbamol 500 mg, Percocet 5 and prednisone 60 mg taper over the next 6 days.  She is encouraged to use ice or heat to her back as needed and to follow-up with her PCP if any continued problems. ____________________________________________   FINAL CLINICAL IMPRESSION(S) / ED DIAGNOSES  Final diagnoses:  Acute left-sided low back pain with left-sided sciatica     ED Discharge Orders         Ordered    methocarbamol (ROBAXIN) 500 MG tablet  4 times daily     01/06/20 1104    oxyCODONE-acetaminophen (PERCOCET) 5-325 MG tablet  Every 6 hours PRN     01/06/20 1104    predniSONE (DELTASONE) 10 MG tablet     01/06/20 1104           Note:  This document was prepared using Dragon voice recognition software and may include unintentional dictation errors.    Johnn Hai, PA-C 01/06/20 1243    Vanessa Cedar, MD 01/07/20 [phone]

## 2020-01-06 NOTE — ED Triage Notes (Signed)
First RN Note: Pt presents to ED via POV with c/o L hip pain, pt states bent down to put leash on dog and felt like someone "just twisted something in my hip". Pt states hx of sciatic nerve problems to L hip at this time.

## 2020-01-06 NOTE — ED Triage Notes (Signed)
Pt to ER states she has been having low back/hip pain for last week.  States yesterday she bent over to put a leash on her dog and had a sudden increase in pain.  Pt denies weakness, loss of bowel or bladder or other neurological deficits.  States pain made her nauseated.  States pain has continued since yesterday and the nausea has been intermittent.

## 2020-01-06 NOTE — ED Notes (Signed)
Pt states that she bent over yesterday and that she got a tightness around her left hip and then 'was seeing stars' and had nausea vomiting. Pt has had n/v since with severe pain around her left hip.

## 2020-01-07 ENCOUNTER — Telehealth: Payer: Self-pay | Admitting: Family Medicine

## 2020-01-07 NOTE — Telephone Encounter (Signed)
Patient called She would like to know if a copy of her recent lab results could be mailed to her home.  Confirmed with patient that address on chart is correct.

## 2020-01-07 NOTE — Telephone Encounter (Signed)
Letter mailed

## 2020-01-07 NOTE — Telephone Encounter (Signed)
Please mail them, thanks

## 2020-01-07 NOTE — Telephone Encounter (Signed)
See prev message. Pt was had CPE scheduled for 01/09/20 but r/s it to 01/22/20, she would like to have a copy mailed to her but Dr. Glori Bickers hasn't advised anything on labs since she was going to discuss them directly with pt , please advise

## 2020-01-09 ENCOUNTER — Encounter: Payer: Medicare HMO | Admitting: Family Medicine

## 2020-01-16 DIAGNOSIS — M533 Sacrococcygeal disorders, not elsewhere classified: Secondary | ICD-10-CM | POA: Diagnosis not present

## 2020-01-16 DIAGNOSIS — M47816 Spondylosis without myelopathy or radiculopathy, lumbar region: Secondary | ICD-10-CM | POA: Diagnosis not present

## 2020-01-16 DIAGNOSIS — M5442 Lumbago with sciatica, left side: Secondary | ICD-10-CM | POA: Diagnosis not present

## 2020-01-16 DIAGNOSIS — M5136 Other intervertebral disc degeneration, lumbar region: Secondary | ICD-10-CM | POA: Diagnosis not present

## 2020-01-21 DIAGNOSIS — M9904 Segmental and somatic dysfunction of sacral region: Secondary | ICD-10-CM | POA: Diagnosis not present

## 2020-01-21 DIAGNOSIS — M9903 Segmental and somatic dysfunction of lumbar region: Secondary | ICD-10-CM | POA: Diagnosis not present

## 2020-01-21 DIAGNOSIS — M5127 Other intervertebral disc displacement, lumbosacral region: Secondary | ICD-10-CM | POA: Diagnosis not present

## 2020-01-21 DIAGNOSIS — M4608 Spinal enthesopathy, sacral and sacrococcygeal region: Secondary | ICD-10-CM | POA: Diagnosis not present

## 2020-01-22 ENCOUNTER — Other Ambulatory Visit: Payer: Self-pay

## 2020-01-22 ENCOUNTER — Encounter: Payer: Self-pay | Admitting: Family Medicine

## 2020-01-22 ENCOUNTER — Ambulatory Visit (INDEPENDENT_AMBULATORY_CARE_PROVIDER_SITE_OTHER): Payer: Medicare HMO | Admitting: Family Medicine

## 2020-01-22 VITALS — BP 124/68 | HR 60 | Temp 97.7°F | Ht 64.0 in | Wt 163.4 lb

## 2020-01-22 DIAGNOSIS — E559 Vitamin D deficiency, unspecified: Secondary | ICD-10-CM

## 2020-01-22 DIAGNOSIS — M8589 Other specified disorders of bone density and structure, multiple sites: Secondary | ICD-10-CM

## 2020-01-22 DIAGNOSIS — E785 Hyperlipidemia, unspecified: Secondary | ICD-10-CM

## 2020-01-22 DIAGNOSIS — M5127 Other intervertebral disc displacement, lumbosacral region: Secondary | ICD-10-CM | POA: Diagnosis not present

## 2020-01-22 DIAGNOSIS — M4608 Spinal enthesopathy, sacral and sacrococcygeal region: Secondary | ICD-10-CM | POA: Diagnosis not present

## 2020-01-22 DIAGNOSIS — Z0000 Encounter for general adult medical examination without abnormal findings: Secondary | ICD-10-CM | POA: Diagnosis not present

## 2020-01-22 DIAGNOSIS — M9903 Segmental and somatic dysfunction of lumbar region: Secondary | ICD-10-CM | POA: Diagnosis not present

## 2020-01-22 DIAGNOSIS — M9904 Segmental and somatic dysfunction of sacral region: Secondary | ICD-10-CM | POA: Diagnosis not present

## 2020-01-22 NOTE — Assessment & Plan Note (Signed)
Level of 33.6  Enc to continue current supplementation  Disc imp to bone and overall health

## 2020-01-22 NOTE — Assessment & Plan Note (Signed)
Rev dexa report from 11/19  No falls or fractures  Taking vitamin D and level is tx

## 2020-01-22 NOTE — Patient Instructions (Addendum)
If you are interested in the new shingles vaccine (Shingrix) - call your local pharmacy to check on coverage and availability  If affordable, get on a wait list at your pharmacy to get the vaccine.  You are due for a pneumovax 23 shot whenever you want to get it-just call and make a nurse appointment   For cholesterol Avoid red meat/ fried foods/ egg yolks/ fatty breakfast meats/ butter, cheese and high fat dairy/ and shellfish    Take care of yourself  Get better soon   Wear your sun protection

## 2020-01-22 NOTE — Progress Notes (Signed)
Subjective:    Patient ID: Jenny Mcfarland, female    DOB: May 10, 1951, 69 y.o.   MRN: [id number]  This visit occurred during the SARS-CoV-2 public health emergency.  Safety protocols were in place, including screening questions prior to the visit, additional usage of staff PPE, and extensive cleaning of exam room while observing appropriate contact time as indicated for disinfecting solutions.    HPI  Here for health maintenance exam and to review chronic medical problems    Wt Readings from Last 3 Encounters:  01/22/20 163 lb 7 oz (74.1 kg)  01/06/20 165 lb (74.8 kg)  01/04/20 166 lb (75.3 kg)   28.05 kg/m   Had amw on 5/7-no concerns   Had covid vaccination   Zoster status -has not had shingrix    prevnar vaccine 4/18  Does not want to do pna 23 today  Flu shot 10/20  Mammogram 11/20 Self breast exam-no lumps  fam hx of breast cancer   dexa 11/19 osteopenia Stable at hip Down slt at forearm  Falls- none  Fractures -none  Supplements -taking vit D  Exercise -walking and lots of outdoor work  D level is 33.6  Colonoscopy 7/17 wit 5 y recall   Has had issues with L back /hip -her SI joint "shifted" when she bent over  Did not fx her hip thankfully  Sees chiropractor   BP Readings from Last 3 Encounters:  01/22/20 124/68  01/06/20 (!) 155/66  09/11/18 132/60   Pulse Readings from Last 3 Encounters:  01/22/20 60  01/06/20 61  09/11/18 (!) 102    Hyperlipidemia Lab Results  Component Value Date   CHOL 216 (H) 01/04/2020   CHOL 191 01/03/2019   CHOL 193 12/22/2017   Lab Results  Component Value Date   HDL 69.20 01/04/2020   HDL 72.30 01/03/2019   HDL 69.40 12/22/2017   Lab Results  Component Value Date   LDLCALC 128 (H) 01/04/2020   LDLCALC 102 (H) 01/03/2019   LDLCALC 102 (H) 12/22/2017   Lab Results  Component Value Date   TRIG 97.0 01/04/2020   TRIG 83.0 01/03/2019   TRIG 106.0 12/22/2017   Lab Results  Component Value Date   CHOLHDL 3 01/04/2020   CHOLHDL 3 01/03/2019   CHOLHDL 3 12/22/2017   Lab Results  Component Value Date   LDLDIRECT 135.4 09/17/2013   LDLDIRECT 125.5 09/11/2012   LDLDIRECT 121.7 05/25/2011  LDL went up  This last year has been hard with pandemic-less active for a while  Ate more fast food   Lab Results  Component Value Date   CREATININE 0.78 01/04/2020   BUN 9 01/04/2020   NA 139 01/04/2020   K 4.5 01/04/2020   CL 105 01/04/2020   CO2 31 01/04/2020   Lab Results  Component Value Date   ALT 10 01/04/2020   AST 13 01/04/2020   ALKPHOS 78 01/04/2020   BILITOT 0.7 01/04/2020    Lab Results  Component Value Date   WBC 6.7 01/04/2020   HGB 12.2 01/04/2020   HCT 36.8 01/04/2020   MCV 84.2 01/04/2020   PLT 245.0 01/04/2020   glucose 89 Lab Results  Component Value Date   TSH 2.15 01/04/2020    Patient Active Problem List   Diagnosis Date Noted  . Welcome to Medicare preventive visit 12/21/2016  . Estrogen deficiency 06/04/2015  . Vitamin D deficiency 09/23/2014  . Thoracic or lumbosacral neuritis or radiculitis, unspecified 05/21/2013  . Gynecological examination 05/25/2011  .  Other screening mammogram 05/25/2011  . Routine general medical examination at a health care facility 05/25/2011  . Osteopenia 12/27/2010  . IBS (irritable bowel syndrome) 12/25/2010  . Allergic rhinitis 12/25/2010  . Asthma 12/25/2010  . Mild hyperlipidemia 12/25/2010  . Family history of breast cancer 12/25/2010  . Family history of aortic aneurysm 12/25/2010  . GERD 11/08/2008  . BARRETT'S ESOPHAGUS 11/08/2008   Past Medical History:  Diagnosis Date  . Allergic rhinitis   . Asthma    WELL CONTROLLED  . Barrett's esophagus 2005  . Complication of anesthesia   . GERD (gastroesophageal reflux disease) 11/2008  . Hemorrhoids   . Hiatal hernia   . History of chicken pox   . IBS (irritable bowel syndrome)   . Osteopenia   . Peptic stricture of esophagus 11-2008  . PONV  (postoperative nausea and vomiting)    N/V WITH GALLBLADDER  . Recurrent sinusitis    Past Surgical History:  Procedure Laterality Date  . BREAST BIOPSY Right 07/26/2017   INTRADUCTAL PAPILLOMA.   Marland Kitchen BREAST LUMPECTOMY Right 08/2017  . BREAST LUMPECTOMY WITH NEEDLE LOCALIZATION Right 09/09/2017   Procedure: BREAST LUMPECTOMY WITH NEEDLE LOCALIZATION;  Surgeon: Leonie Green, MD;  Location: [facility] ORS;  Service: General;  Laterality: Right;  . CHOLECYSTECTOMY  2010  . TUBAL LIGATION     Social History   Tobacco Use  . Smoking status: Never Smoker  . Smokeless tobacco: Never Used  Substance Use Topics  . Alcohol use: Yes    Alcohol/week: 0.0 standard drinks    Comment: RARE  . Drug use: No   Family History  Problem Relation Age of Onset  . Aortic aneurysm Mother   . Heart disease Mother   . Heart attack Brother        early 30's  . Arthritis Brother   . Breast cancer Sister 31  . Colon cancer Neg Hx    Allergies  Allergen Reactions  . Levofloxacin Itching and Other (See Comments)    Felt like she had bugs crawling all over her.  Lavella Lemons [Benzonatate] Nausea And Vomiting    Made GERD worse   Current Outpatient Medications on File Prior to Visit  Medication Sig Dispense Refill  . cholecalciferol (VITAMIN D) 25 MCG (1000 UT) tablet Take 3,000 Units by mouth daily.    Marland Kitchen dicyclomine (BENTYL) 10 MG capsule Take 1 capsule by mouth daily as needed.    . fexofenadine (ALLEGRA) 180 MG tablet Take 180 mg by mouth every morning.     . Fluticasone-Salmeterol (ADVAIR DISKUS) 250-50 MCG/DOSE AEPB Inhale 1 puff into the lungs 2 (two) times daily as needed. (Patient taking differently: Inhale 1 puff into the lungs 2 (two) times daily. ) 60 each 11  . gabapentin (NEURONTIN) 300 MG capsule Take 300 mg by mouth at bedtime.    . levalbuterol (XOPENEX HFA) 45 MCG/ACT inhaler Inhale 1 puff into the lungs every 4 (four) hours as needed for wheezing.    Marland Kitchen omeprazole (PRILOSEC) 20 MG  capsule Take 1 capsule (20 mg total) by mouth 2 (two) times daily before a meal. 60 capsule 5  . zolpidem (AMBIEN) 5 MG tablet TAKE 1/2 TO 1 (ONE-HALF TO ONE) TABLET BY MOUTH AT BEDTIME AS NEEDED 30 tablet 3  . Doxepin HCl 6 MG TABS Take 1 tablet (6 mg total) by mouth at bedtime as needed. (Patient not taking: Reported on 01/22/2020) 30 tablet 3   No current facility-administered medications on file prior to visit.  Review of Systems  Constitutional: Negative for activity change, appetite change, fatigue, fever and unexpected weight change.  HENT: Negative for congestion, ear pain, rhinorrhea, sinus pressure and sore throat.   Eyes: Negative for pain, redness and visual disturbance.  Respiratory: Negative for cough, shortness of breath and wheezing.   Cardiovascular: Negative for chest pain and palpitations.  Gastrointestinal: Negative for abdominal pain, blood in stool, constipation and diarrhea.  Endocrine: Negative for polydipsia and polyuria.  Genitourinary: Negative for dysuria, frequency and urgency.  Musculoskeletal: Negative for arthralgias, back pain and myalgias.       Pain in pelvis from recent SI joint injury  Skin: Negative for pallor and rash.  Allergic/Immunologic: Negative for environmental allergies.  Neurological: Negative for dizziness, syncope and headaches.  Hematological: Negative for adenopathy. Does not bruise/bleed easily.  Psychiatric/Behavioral: Negative for decreased concentration and dysphoric mood. The patient is not nervous/anxious.        Objective:   Physical Exam Constitutional:      General: She is not in acute distress.    Appearance: Normal appearance. She is well-developed and normal weight. She is not ill-appearing or diaphoretic.  HENT:     Head: Normocephalic and atraumatic.     Right Ear: Tympanic membrane, ear canal and external ear normal.     Left Ear: Tympanic membrane, ear canal and external ear normal.     Nose: Nose normal. No  congestion.     Mouth/Throat:     Mouth: Mucous membranes are moist.     Pharynx: Oropharynx is clear. No posterior oropharyngeal erythema.  Eyes:     General: No scleral icterus.    Extraocular Movements: Extraocular movements intact.     Conjunctiva/sclera: Conjunctivae normal.     Pupils: Pupils are equal, round, and reactive to light.  Neck:     Thyroid: No thyromegaly.     Vascular: No carotid bruit or JVD.  Cardiovascular:     Rate and Rhythm: Normal rate and regular rhythm.     Pulses: Normal pulses.     Heart sounds: Normal heart sounds. No gallop.   Pulmonary:     Effort: Pulmonary effort is normal. No respiratory distress.     Breath sounds: Normal breath sounds. No wheezing.     Comments: Good air exch Chest:     Chest wall: No tenderness.  Abdominal:     General: Bowel sounds are normal. There is no distension or abdominal bruit.     Palpations: Abdomen is soft. There is no mass.     Tenderness: There is no abdominal tenderness.     Hernia: No hernia is present.  Genitourinary:    Comments: Breast exam: No mass, nodules, thickening, tenderness, bulging, retraction, inflamation, nipple discharge or skin changes noted.  No axillary or clavicular LA.     Musculoskeletal:        General: No tenderness. Normal range of motion.     Cervical back: Normal range of motion and neck supple. No rigidity. No muscular tenderness.     Right lower leg: No edema.     Left lower leg: No edema.     Comments: Limited rom L hip  Lymphadenopathy:     Cervical: No cervical adenopathy.  Skin:    General: Skin is warm and dry.     Coloration: Skin is not pale.     Findings: No erythema or rash.     Comments: Solar lentigines diffusely   Neurological:     Mental Status: She  is alert. Mental status is at baseline.     Cranial Nerves: No cranial nerve deficit.     Motor: No abnormal muscle tone.     Coordination: Coordination normal.     Gait: Gait normal.     Deep Tendon Reflexes:  Reflexes are normal and symmetric. Reflexes normal.  Psychiatric:        Mood and Affect: Mood normal.        Cognition and Memory: Cognition and memory normal.           Assessment & Plan:   Problem List Items Addressed This Visit      Musculoskeletal and Integument   Osteopenia    Rev dexa report from 11/19  No falls or fractures  Taking vitamin D and level is tx          Other   Mild hyperlipidemia    Disc goals for lipids and reasons to control them Rev last labs with pt Rev low sat fat diet in detail  LDL is up significantly at 128  HDL remains high  Pt admits to much more fast food and plans to stop it      Routine general medical examination at a health care facility - Primary    Reviewed health habits including diet and exercise and skin cancer prevention Reviewed appropriate screening tests for age  Also reviewed health mt list, fam hx and immunization status , as well as social and family history   See HPI Labs reviewed  dexa reviewed /no falls or fx  amw reviewed  Due for pna 23-she want to schedule for a different day  Also disc shingrix-may be interested  She is vaccinated for covid       Vitamin D deficiency    Level of 33.6  Enc to continue current supplementation  Disc imp to bone and overall health

## 2020-01-22 NOTE — Assessment & Plan Note (Signed)
Reviewed health habits including diet and exercise and skin cancer prevention Reviewed appropriate screening tests for age  Also reviewed health mt list, fam hx and immunization status , as well as social and family history   See HPI Labs reviewed  dexa reviewed /no falls or fx  amw reviewed  Due for pna 23-she want to schedule for a different day  Also disc shingrix-may be interested  She is vaccinated for covid

## 2020-01-22 NOTE — Assessment & Plan Note (Signed)
Disc goals for lipids and reasons to control them Rev last labs with pt Rev low sat fat diet in detail  LDL is up significantly at 128  HDL remains high  Pt admits to much more fast food and plans to stop it

## 2020-01-28 DIAGNOSIS — M9904 Segmental and somatic dysfunction of sacral region: Secondary | ICD-10-CM | POA: Diagnosis not present

## 2020-01-28 DIAGNOSIS — M4608 Spinal enthesopathy, sacral and sacrococcygeal region: Secondary | ICD-10-CM | POA: Diagnosis not present

## 2020-01-28 DIAGNOSIS — M9903 Segmental and somatic dysfunction of lumbar region: Secondary | ICD-10-CM | POA: Diagnosis not present

## 2020-01-28 DIAGNOSIS — M5127 Other intervertebral disc displacement, lumbosacral region: Secondary | ICD-10-CM | POA: Diagnosis not present

## 2020-01-30 DIAGNOSIS — M9903 Segmental and somatic dysfunction of lumbar region: Secondary | ICD-10-CM | POA: Diagnosis not present

## 2020-01-30 DIAGNOSIS — M9904 Segmental and somatic dysfunction of sacral region: Secondary | ICD-10-CM | POA: Diagnosis not present

## 2020-01-30 DIAGNOSIS — M4608 Spinal enthesopathy, sacral and sacrococcygeal region: Secondary | ICD-10-CM | POA: Diagnosis not present

## 2020-01-30 DIAGNOSIS — M5127 Other intervertebral disc displacement, lumbosacral region: Secondary | ICD-10-CM | POA: Diagnosis not present

## 2020-02-04 DIAGNOSIS — M5127 Other intervertebral disc displacement, lumbosacral region: Secondary | ICD-10-CM | POA: Diagnosis not present

## 2020-02-04 DIAGNOSIS — M9903 Segmental and somatic dysfunction of lumbar region: Secondary | ICD-10-CM | POA: Diagnosis not present

## 2020-02-04 DIAGNOSIS — M4608 Spinal enthesopathy, sacral and sacrococcygeal region: Secondary | ICD-10-CM | POA: Diagnosis not present

## 2020-02-04 DIAGNOSIS — M9904 Segmental and somatic dysfunction of sacral region: Secondary | ICD-10-CM | POA: Diagnosis not present

## 2020-04-01 ENCOUNTER — Encounter: Payer: Self-pay | Admitting: Gastroenterology

## 2020-04-01 ENCOUNTER — Ambulatory Visit: Payer: Medicare HMO | Admitting: Gastroenterology

## 2020-04-01 VITALS — BP 124/68 | HR 72 | Ht 64.25 in | Wt 164.0 lb

## 2020-04-01 DIAGNOSIS — Z86010 Personal history of colonic polyps: Secondary | ICD-10-CM

## 2020-04-01 DIAGNOSIS — K2100 Gastro-esophageal reflux disease with esophagitis, without bleeding: Secondary | ICD-10-CM | POA: Diagnosis not present

## 2020-04-01 MED ORDER — OMEPRAZOLE 20 MG PO CPDR: 20.0000 mg | DELAYED_RELEASE_CAPSULE | Freq: Two times a day (BID) | ORAL | 11 refills | Status: DC

## 2020-04-01 NOTE — Progress Notes (Signed)
° ° °  History of Present Illness: This is a 69 year old female with GERD with esophagitis and a history of short segment Barrett's esophagus without dysplasia.  Her last 2 endoscopies have not shown clear evidence of Barrett's endoscopically and biopsies have not shown Barrett's.  Reflux symptoms are well controlled on omeprazole 20 mg twice daily.  She feels that Prevacid Solutab 30 mg twice daily worked slightly better however this is not covered by her insurance and would be quite expensive for her.  After a bout of constipation in May she had loose stools for couple weeks and began taking dicyclomine as needed and Align daily.  Her bowel habits have now returned to normal.  Current Medications, Allergies, Past Medical History, Past Surgical History, Family History and Social History were reviewed in [employer] record.   Physical Exam: General: Well developed, well nourished, no acute distress Head: Normocephalic and atraumatic Eyes:  sclerae anicteric, EOMI Ears: Normal auditory acuity Mouth: Not examined, mask on during Covid-19 pandemic Lungs: Clear throughout to auscultation Heart: Regular rate and rhythm; no murmurs, rubs or bruits Abdomen: Soft, non tender and non distended. No masses, hepatosplenomegaly or hernias noted. Normal Bowel sounds Rectal: Not done Musculoskeletal: Symmetrical with no gross deformities  Pulses:  Normal pulses noted Extremities: No clubbing, cyanosis, edema or deformities noted Neurological: Alert oriented x 4, grossly nonfocal Psychological:  Alert and cooperative. Normal mood and affect   Assessment and Recommendations:  1.  GERD with history of LA class B erosive esophagitis.  History of Barrett's esophagus without dysplasia.  More recent endoscopies have not shown Barrett's endoscopically or biopsies.  A 5-year interval surveillance EGD is recommended in July 2022.  Continue omeprazole 20 mg p.o. twice daily.  If her reflux  symptoms are not under very good control we discussed increasing to 40 mg twice daily or 40 mg every morning and 20 mg every afternoon as options.  2.  Personal history of adenomatous colon polyps.  5-year surveillance colonoscopy is recommended in July 2022.

## 2020-04-01 NOTE — Patient Instructions (Signed)
We have sent the following medications to your pharmacy for you to pick up at your convenience:omeprazole.  Thank you for choosing me and [facility].  Malcolm T. Stark, Jr., MD., FACG   

## 2020-04-09 DIAGNOSIS — Z888 Allergy status to other drugs, medicaments and biological substances status: Secondary | ICD-10-CM | POA: Diagnosis not present

## 2020-04-09 DIAGNOSIS — Z881 Allergy status to other antibiotic agents status: Secondary | ICD-10-CM | POA: Diagnosis not present

## 2020-04-09 DIAGNOSIS — R030 Elevated blood-pressure reading, without diagnosis of hypertension: Secondary | ICD-10-CM | POA: Diagnosis not present

## 2020-04-09 DIAGNOSIS — K219 Gastro-esophageal reflux disease without esophagitis: Secondary | ICD-10-CM | POA: Diagnosis not present

## 2020-04-09 DIAGNOSIS — K589 Irritable bowel syndrome without diarrhea: Secondary | ICD-10-CM | POA: Diagnosis not present

## 2020-04-09 DIAGNOSIS — Z7951 Long term (current) use of inhaled steroids: Secondary | ICD-10-CM | POA: Diagnosis not present

## 2020-04-09 DIAGNOSIS — J45909 Unspecified asthma, uncomplicated: Secondary | ICD-10-CM | POA: Diagnosis not present

## 2020-04-09 DIAGNOSIS — G4700 Insomnia, unspecified: Secondary | ICD-10-CM | POA: Diagnosis not present

## 2020-04-25 ENCOUNTER — Other Ambulatory Visit: Payer: Self-pay | Admitting: Family Medicine

## 2020-04-25 DIAGNOSIS — Z1231 Encounter for screening mammogram for malignant neoplasm of breast: Secondary | ICD-10-CM

## 2020-05-08 DIAGNOSIS — H1045 Other chronic allergic conjunctivitis: Secondary | ICD-10-CM | POA: Diagnosis not present

## 2020-05-08 DIAGNOSIS — J301 Allergic rhinitis due to pollen: Secondary | ICD-10-CM | POA: Diagnosis not present

## 2020-05-08 DIAGNOSIS — J3089 Other allergic rhinitis: Secondary | ICD-10-CM | POA: Diagnosis not present

## 2020-05-08 DIAGNOSIS — J4530 Mild persistent asthma, uncomplicated: Secondary | ICD-10-CM | POA: Diagnosis not present

## 2020-05-21 ENCOUNTER — Other Ambulatory Visit: Payer: Self-pay

## 2020-05-21 ENCOUNTER — Ambulatory Visit (INDEPENDENT_AMBULATORY_CARE_PROVIDER_SITE_OTHER): Payer: Medicare HMO

## 2020-05-21 DIAGNOSIS — Z23 Encounter for immunization: Secondary | ICD-10-CM

## 2020-07-28 ENCOUNTER — Ambulatory Visit
Admission: RE | Admit: 2020-07-28 | Discharge: 2020-07-28 | Disposition: A | Discharge status: Home / Self Care | Payer: Medicare HMO | Source: Ambulatory Visit | Attending: Family Medicine | Admitting: Family Medicine

## 2020-07-28 ENCOUNTER — Other Ambulatory Visit: Payer: Self-pay

## 2020-07-28 DIAGNOSIS — Z1231 Encounter for screening mammogram for malignant neoplasm of breast: Secondary | ICD-10-CM | POA: Insufficient documentation

## 2020-07-29 ENCOUNTER — Other Ambulatory Visit: Payer: Self-pay | Admitting: Family Medicine

## 2020-07-29 DIAGNOSIS — R928 Other abnormal and inconclusive findings on diagnostic imaging of breast: Secondary | ICD-10-CM

## 2020-08-11 ENCOUNTER — Other Ambulatory Visit: Payer: Self-pay

## 2020-08-11 ENCOUNTER — Ambulatory Visit
Admission: RE | Admit: 2020-08-11 | Discharge: 2020-08-11 | Disposition: A | Discharge status: Home / Self Care | Payer: Medicare HMO | Source: Ambulatory Visit | Attending: Family Medicine | Admitting: Family Medicine

## 2020-08-11 ENCOUNTER — Other Ambulatory Visit: Payer: Self-pay | Admitting: Family Medicine

## 2020-08-11 DIAGNOSIS — R928 Other abnormal and inconclusive findings on diagnostic imaging of breast: Secondary | ICD-10-CM | POA: Insufficient documentation

## 2020-08-11 DIAGNOSIS — R922 Inconclusive mammogram: Secondary | ICD-10-CM | POA: Diagnosis not present

## 2020-08-19 ENCOUNTER — Other Ambulatory Visit: Payer: Medicare HMO

## 2020-08-19 ENCOUNTER — Ambulatory Visit: Payer: Medicare HMO

## 2020-08-27 ENCOUNTER — Ambulatory Visit
Admission: RE | Admit: 2020-08-27 | Discharge: 2020-08-27 | Disposition: A | Discharge status: Home / Self Care | Payer: Medicare HMO | Source: Ambulatory Visit | Attending: Family Medicine | Admitting: Family Medicine

## 2020-08-27 ENCOUNTER — Other Ambulatory Visit: Payer: Self-pay

## 2020-08-27 DIAGNOSIS — N6032 Fibrosclerosis of left breast: Secondary | ICD-10-CM | POA: Diagnosis not present

## 2020-08-27 DIAGNOSIS — R928 Other abnormal and inconclusive findings on diagnostic imaging of breast: Secondary | ICD-10-CM

## 2020-08-27 HISTORY — PX: BREAST BIOPSY: SHX20

## 2020-08-28 LAB — SURGICAL PATHOLOGY

## 2020-09-01 ENCOUNTER — Encounter: Payer: Self-pay | Admitting: *Deleted

## 2020-09-01 NOTE — Progress Notes (Signed)
Received message from Randa Lynn, RN from [facility] that patient need navigation assistance for surgical referral.  Left message for patient to return my call.

## 2020-09-09 ENCOUNTER — Other Ambulatory Visit: Payer: Self-pay | Admitting: General Surgery

## 2020-09-09 DIAGNOSIS — N6489 Other specified disorders of breast: Secondary | ICD-10-CM | POA: Diagnosis not present

## 2020-09-09 DIAGNOSIS — D242 Benign neoplasm of left breast: Secondary | ICD-10-CM | POA: Diagnosis not present

## 2020-09-11 ENCOUNTER — Telehealth: Payer: Self-pay

## 2020-09-11 NOTE — Telephone Encounter (Signed)
Received a call from [facility] for zolpidem. I answered the questions while on the phone.

## 2020-09-12 MED ORDER — ZOLPIDEM TARTRATE 5 MG PO TABS: 2.5000 mg | ORAL_TABLET | Freq: Every day | ORAL | 3 refills | Status: DC

## 2020-09-12 NOTE — Telephone Encounter (Signed)
Received fax approval letter and placed in Dr. Marliss Coots inbox to sign and send for scanning

## 2020-09-12 NOTE — Addendum Note (Signed)
Addended by: Loura Pardon A on: 09/12/2020 12:43 PM   Modules accepted: Orders

## 2020-09-12 NOTE — Telephone Encounter (Signed)
Pt called stating that she just got notification of approval for the Ambien Rx and would like Rx sent to [facility]

## 2020-09-19 ENCOUNTER — Encounter
Admission: RE | Admit: 2020-09-19 | Discharge: 2020-09-19 | Disposition: A | Discharge status: Home / Self Care | Payer: Medicare HMO | Source: Ambulatory Visit | Attending: General Surgery | Admitting: General Surgery

## 2020-09-19 ENCOUNTER — Ambulatory Visit: Payer: Self-pay | Admitting: General Surgery

## 2020-09-19 ENCOUNTER — Other Ambulatory Visit: Payer: Self-pay

## 2020-09-19 DIAGNOSIS — J45909 Unspecified asthma, uncomplicated: Secondary | ICD-10-CM | POA: Insufficient documentation

## 2020-09-19 DIAGNOSIS — Z01818 Encounter for other preprocedural examination: Secondary | ICD-10-CM | POA: Insufficient documentation

## 2020-09-19 DIAGNOSIS — N6489 Other specified disorders of breast: Secondary | ICD-10-CM | POA: Diagnosis not present

## 2020-09-19 HISTORY — DX: Unspecified osteoarthritis, unspecified site: M19.90

## 2020-09-19 NOTE — Patient Instructions (Addendum)
Your procedure is scheduled on:09-26-20 FRIDAY Report to the Registration Desk on the 1st floor of the [facility]-Then proceed to the 2nd floor Surgery Desk in the [place] To find out your arrival time, please call [phone] between 1PM - 3PM on:09-25-20 THURSDAY  REMEMBER: Instructions that are not followed completely may result in serious medical risk, up to and including death; or upon the discretion of your surgeon and anesthesiologist your surgery may need to be rescheduled.  Do not eat food after midnight the night before surgery.  No gum chewing, lozengers or hard candies.  You may however, drink CLEAR liquids up to 2 hours before you are scheduled to arrive for your surgery. Do not drink anything within 2 hours of your scheduled arrival time.  Clear liquids include: - water  - apple juice without pulp - gatorade - black coffee or tea (Do NOT add milk or creamers to the coffee or tea) Do NOT drink anything that is not on this list.  TAKE THESE MEDICATIONS THE MORNING OF SURGERY WITH A SIP OF WATER: -ALLEGRA (FEXOFENADINE) -PRILOSEC (OMEPRAZOLE)  Use inhalers on the day of surgery and bring to the [place]  One week prior to surgery: Stop Anti-inflammatories (NSAIDS) such as Advil, Aleve, Ibuprofen, Motrin, Naproxen, Naprosyn and Aspirin based products such as Excedrin, Goodys Powder, BC Powder-OK TO TAKE TYLENOL IF NEEDED  Stop ANY OVER THE COUNTER supplements until after surgery. (However, you may continue taking Vitamin D and Probiotic up until the day before surgery.)  No Alcohol for 24 hours before or after surgery.  No Smoking including e-cigarettes for 24 hours prior to surgery.  No chewable tobacco products for at least 6 hours prior to surgery.  No nicotine patches on the day of surgery.  Do not use any "recreational" drugs for at least a week prior  to your surgery.  Please be advised that the combination of cocaine and anesthesia may have negative outcomes, up to and including death. If you test positive for cocaine, your surgery will be cancelled.  On the morning of surgery brush your teeth with toothpaste and water, you may rinse your mouth with mouthwash if you wish. Do not swallow any toothpaste or mouthwash.  Do not wear jewelry, make-up, hairpins, clips or nail polish.  Do not wear lotions, powders, or perfumes.   Do not shave body from the neck down 48 hours prior to surgery just in case you cut yourself which could leave a site for infection.  Also, freshly shaved skin may become irritated if using the CHG soap.  Contact lenses, hearing aids and dentures may not be worn into surgery.  Do not bring valuables to the hospital. [facility] is not responsible for any missing/lost belongings or valuables.   Use CHG Soap as directed on instruction sheet.  Notify your doctor if there is any change in your medical condition (cold, fever, infection).  Wear comfortable clothing (specific to your surgery type) to the hospital.  Plan for stool softeners for home use; pain medications have a tendency to cause constipation. You can also help prevent constipation by eating foods high in fiber such as fruits and vegetables and drinking plenty of fluids as your diet allows.  After surgery, you can help prevent lung complications by doing breathing exercises.  Take deep breaths and cough every 1-2 hours. Your doctor may order a device called an  Incentive Spirometer to help you take deep breaths. When coughing or sneezing, hold a pillow firmly against your incision with both hands. This is called "splinting." Doing this helps protect your incision. It also decreases belly discomfort.  If you are being admitted to the hospital overnight, leave your suitcase in the car. After surgery it may be brought to your room.  If you are being  discharged the day of surgery, you will not be allowed to drive home. You will need a responsible adult (18 years or older) to drive you home and stay with you that night.   If you are taking public transportation, you will need to have a responsible adult (18 years or older) with you. Please confirm with your physician that it is acceptable to use public transportation.   Please call the [place] Dept. at [phone] if you have any questions about these instructions.  Visitation Policy:  Patients undergoing a surgery or procedure may have one family member or support person with them as long as that person is not COVID-19 positive or experiencing its symptoms.  That person may remain in the waiting area during the procedure.  Inpatient Visitation:    Visiting hours are 7 a.m. to 8 p.m. Patients will be allowed one visitor. The visitor may change daily. The visitor must pass COVID-19 screenings, use hand sanitizer when entering and exiting the patient's room and wear a mask at all times, including in the patient's room. Patients must also wear a mask when staff or their visitor are in the room. Masking is required regardless of vaccination status. Systemwide, no visitors 17 or younger.

## 2020-09-22 ENCOUNTER — Ambulatory Visit
Admission: RE | Admit: 2020-09-22 | Discharge: 2020-09-22 | Disposition: A | Discharge status: Home / Self Care | Payer: Medicare HMO | Source: Ambulatory Visit | Attending: General Surgery | Admitting: General Surgery

## 2020-09-22 ENCOUNTER — Encounter
Admission: RE | Admit: 2020-09-22 | Discharge: 2020-09-22 | Disposition: A | Discharge status: Home / Self Care | Payer: Medicare HMO | Source: Ambulatory Visit | Attending: General Surgery | Admitting: General Surgery

## 2020-09-22 ENCOUNTER — Other Ambulatory Visit: Payer: Self-pay

## 2020-09-22 DIAGNOSIS — Z01810 Encounter for preprocedural cardiovascular examination: Secondary | ICD-10-CM | POA: Diagnosis not present

## 2020-09-22 DIAGNOSIS — R928 Other abnormal and inconclusive findings on diagnostic imaging of breast: Secondary | ICD-10-CM | POA: Diagnosis not present

## 2020-09-22 DIAGNOSIS — N6489 Other specified disorders of breast: Secondary | ICD-10-CM | POA: Diagnosis not present

## 2020-09-22 DIAGNOSIS — J45909 Unspecified asthma, uncomplicated: Secondary | ICD-10-CM | POA: Insufficient documentation

## 2020-09-24 ENCOUNTER — Other Ambulatory Visit: Payer: Self-pay

## 2020-09-24 ENCOUNTER — Other Ambulatory Visit
Admission: RE | Admit: 2020-09-24 | Discharge: 2020-09-24 | Disposition: A | Discharge status: Home / Self Care | Payer: Medicare HMO | Source: Ambulatory Visit | Attending: General Surgery | Admitting: General Surgery

## 2020-09-24 DIAGNOSIS — Z01812 Encounter for preprocedural laboratory examination: Secondary | ICD-10-CM | POA: Diagnosis not present

## 2020-09-24 DIAGNOSIS — Z20822 Contact with and (suspected) exposure to covid-19: Secondary | ICD-10-CM | POA: Diagnosis not present

## 2020-09-25 LAB — SARS CORONAVIRUS 2 (TAT 6-24 HRS): SARS Coronavirus 2: NEGATIVE

## 2020-09-26 ENCOUNTER — Other Ambulatory Visit: Payer: Self-pay

## 2020-09-26 ENCOUNTER — Encounter: Payer: Self-pay | Admitting: General Surgery

## 2020-09-26 ENCOUNTER — Encounter
Admission: RE | Disposition: A | Discharge status: Home Health | Payer: Self-pay | Source: Home / Self Care | Attending: General Surgery

## 2020-09-26 ENCOUNTER — Ambulatory Visit
Admission: RE | Admit: 2020-09-26 | Discharge: 2020-09-26 | Disposition: A | Discharge status: Home Health | Payer: Medicare HMO | Attending: General Surgery | Admitting: General Surgery

## 2020-09-26 ENCOUNTER — Ambulatory Visit
Admission: RE | Admit: 2020-09-26 | Discharge: 2020-09-26 | Disposition: A | Discharge status: Home / Self Care | Payer: Medicare HMO | Source: Ambulatory Visit | Attending: General Surgery | Admitting: General Surgery

## 2020-09-26 ENCOUNTER — Ambulatory Visit: Payer: Medicare HMO | Admitting: Anesthesiology

## 2020-09-26 DIAGNOSIS — Z7951 Long term (current) use of inhaled steroids: Secondary | ICD-10-CM | POA: Diagnosis not present

## 2020-09-26 DIAGNOSIS — Z881 Allergy status to other antibiotic agents status: Secondary | ICD-10-CM | POA: Insufficient documentation

## 2020-09-26 DIAGNOSIS — Z79899 Other long term (current) drug therapy: Secondary | ICD-10-CM | POA: Diagnosis not present

## 2020-09-26 DIAGNOSIS — Z888 Allergy status to other drugs, medicaments and biological substances status: Secondary | ICD-10-CM | POA: Insufficient documentation

## 2020-09-26 DIAGNOSIS — R928 Other abnormal and inconclusive findings on diagnostic imaging of breast: Secondary | ICD-10-CM | POA: Diagnosis not present

## 2020-09-26 DIAGNOSIS — D242 Benign neoplasm of left breast: Secondary | ICD-10-CM | POA: Insufficient documentation

## 2020-09-26 DIAGNOSIS — N6022 Fibroadenosis of left breast: Secondary | ICD-10-CM | POA: Insufficient documentation

## 2020-09-26 DIAGNOSIS — N6089 Other benign mammary dysplasias of unspecified breast: Secondary | ICD-10-CM | POA: Diagnosis not present

## 2020-09-26 DIAGNOSIS — N6489 Other specified disorders of breast: Secondary | ICD-10-CM | POA: Insufficient documentation

## 2020-09-26 HISTORY — PX: BREAST BIOPSY WITH RADIO FREQUENCY LOCALIZER: SHX6895

## 2020-09-26 SURGERY — BREAST BIOPSY WITH RADIO FREQUENCY LOCALIZER
Anesthesia: General | Site: Breast | Laterality: Left

## 2020-09-26 MED ORDER — GLYCOPYRROLATE 0.2 MG/ML IJ SOLN
INTRAMUSCULAR | Status: DC | PRN
  Administered 2020-09-26: .2 mg via INTRAVENOUS

## 2020-09-26 MED ORDER — ONDANSETRON HCL 4 MG/2ML IJ SOLN
INTRAMUSCULAR | Status: DC | PRN
  Administered 2020-09-26: 4 mg via INTRAVENOUS

## 2020-09-26 MED ORDER — FENTANYL CITRATE (PF) 100 MCG/2ML IJ SOLN: 25.0000 ug | INTRAMUSCULAR | Status: DC | PRN

## 2020-09-26 MED ORDER — ACETAMINOPHEN 10 MG/ML IV SOLN
INTRAVENOUS | Status: DC | PRN
  Administered 2020-09-26: 1000 mg via INTRAVENOUS

## 2020-09-26 MED ORDER — CHLORHEXIDINE GLUCONATE 0.12 % MT SOLN: 15.0000 mL | Freq: Once | OROMUCOSAL | Status: AC

## 2020-09-26 MED ORDER — HYDROCODONE-ACETAMINOPHEN 5-325 MG PO TABS
ORAL_TABLET | ORAL | Status: AC
  Filled 2020-09-26: qty 1

## 2020-09-26 MED ORDER — LIDOCAINE HCL (PF) 2 % IJ SOLN
INTRAMUSCULAR | Status: AC
  Filled 2020-09-26: qty 5

## 2020-09-26 MED ORDER — KETOROLAC TROMETHAMINE 30 MG/ML IJ SOLN
INTRAMUSCULAR | Status: DC | PRN
  Administered 2020-09-26: 30 mg via INTRAVENOUS

## 2020-09-26 MED ORDER — ACETAMINOPHEN 10 MG/ML IV SOLN
INTRAVENOUS | Status: AC
  Filled 2020-09-26: qty 100

## 2020-09-26 MED ORDER — EPINEPHRINE PF 1 MG/ML IJ SOLN
INTRAMUSCULAR | Status: AC
  Filled 2020-09-26: qty 1

## 2020-09-26 MED ORDER — CEFAZOLIN SODIUM-DEXTROSE 2-4 GM/100ML-% IV SOLN
2.0000 g | INTRAVENOUS | Status: AC
  Administered 2020-09-26: 2 g via INTRAVENOUS

## 2020-09-26 MED ORDER — ONDANSETRON HCL 4 MG/2ML IJ SOLN: 4.0000 mg | Freq: Once | INTRAMUSCULAR | Status: DC | PRN

## 2020-09-26 MED ORDER — FENTANYL CITRATE (PF) 100 MCG/2ML IJ SOLN
INTRAMUSCULAR | Status: AC
  Filled 2020-09-26: qty 2

## 2020-09-26 MED ORDER — MIDAZOLAM HCL 2 MG/2ML IJ SOLN
INTRAMUSCULAR | Status: AC
  Filled 2020-09-26: qty 2

## 2020-09-26 MED ORDER — ACETAMINOPHEN 10 MG/ML IV SOLN: 1000.0000 mg | Freq: Once | INTRAVENOUS | Status: DC | PRN

## 2020-09-26 MED ORDER — ROPIVACAINE HCL 5 MG/ML IJ SOLN
INTRAMUSCULAR | Status: AC
  Filled 2020-09-26: qty 20

## 2020-09-26 MED ORDER — ONDANSETRON HCL 4 MG/2ML IJ SOLN
INTRAMUSCULAR | Status: AC
  Filled 2020-09-26: qty 2

## 2020-09-26 MED ORDER — LIDOCAINE HCL (CARDIAC) PF 100 MG/5ML IV SOSY
PREFILLED_SYRINGE | INTRAVENOUS | Status: DC | PRN
  Administered 2020-09-26: 80 mg via INTRAVENOUS

## 2020-09-26 MED ORDER — LACTATED RINGERS IV SOLN: INTRAVENOUS | Status: DC

## 2020-09-26 MED ORDER — OXYCODONE HCL 5 MG/5ML PO SOLN: 5.0000 mg | Freq: Once | ORAL | Status: DC | PRN

## 2020-09-26 MED ORDER — HYDROCODONE-ACETAMINOPHEN 5-325 MG PO TABS
1.0000 | ORAL_TABLET | Freq: Once | ORAL | Status: AC
  Administered 2020-09-26: 1 via ORAL

## 2020-09-26 MED ORDER — DEXMEDETOMIDINE (PRECEDEX) IN NS 20 MCG/5ML (4 MCG/ML) IV SYRINGE
PREFILLED_SYRINGE | INTRAVENOUS | Status: AC
  Filled 2020-09-26: qty 5

## 2020-09-26 MED ORDER — ROCURONIUM BROMIDE 100 MG/10ML IV SOLN
INTRAVENOUS | Status: DC | PRN
  Administered 2020-09-26: 40 mg via INTRAVENOUS

## 2020-09-26 MED ORDER — ORAL CARE MOUTH RINSE: 15.0000 mL | Freq: Once | OROMUCOSAL | Status: AC

## 2020-09-26 MED ORDER — SUGAMMADEX SODIUM 200 MG/2ML IV SOLN
INTRAVENOUS | Status: DC | PRN
  Administered 2020-09-26: 200 mg via INTRAVENOUS

## 2020-09-26 MED ORDER — OXYCODONE HCL 5 MG PO TABS: 5.0000 mg | ORAL_TABLET | Freq: Once | ORAL | Status: DC | PRN

## 2020-09-26 MED ORDER — ROCURONIUM BROMIDE 10 MG/ML (PF) SYRINGE
PREFILLED_SYRINGE | INTRAVENOUS | Status: AC
  Filled 2020-09-26: qty 10

## 2020-09-26 MED ORDER — FENTANYL CITRATE (PF) 100 MCG/2ML IJ SOLN
INTRAMUSCULAR | Status: DC | PRN
  Administered 2020-09-26 (×2): 50 ug via INTRAVENOUS

## 2020-09-26 MED ORDER — PROPOFOL 10 MG/ML IV BOLUS
INTRAVENOUS | Status: AC
  Filled 2020-09-26: qty 60

## 2020-09-26 MED ORDER — DEXMEDETOMIDINE (PRECEDEX) IN NS 20 MCG/5ML (4 MCG/ML) IV SYRINGE
PREFILLED_SYRINGE | INTRAVENOUS | Status: DC | PRN
  Administered 2020-09-26 (×2): 8 ug via INTRAVENOUS
  Administered 2020-09-26: 4 ug via INTRAVENOUS

## 2020-09-26 MED ORDER — HYDROCODONE-ACETAMINOPHEN 5-325 MG PO TABS: 1.0000 | ORAL_TABLET | ORAL | 0 refills | Status: AC | PRN

## 2020-09-26 MED ORDER — CEFAZOLIN SODIUM-DEXTROSE 2-4 GM/100ML-% IV SOLN
INTRAVENOUS | Status: AC
  Filled 2020-09-26: qty 100

## 2020-09-26 MED ORDER — BUPIVACAINE HCL (PF) 0.5 % IJ SOLN
INTRAMUSCULAR | Status: AC
  Filled 2020-09-26: qty 30

## 2020-09-26 MED ORDER — EPHEDRINE SULFATE 50 MG/ML IJ SOLN
INTRAMUSCULAR | Status: DC | PRN
  Administered 2020-09-26 (×2): 5 mg via INTRAVENOUS

## 2020-09-26 MED ORDER — DEXAMETHASONE SODIUM PHOSPHATE 10 MG/ML IJ SOLN
INTRAMUSCULAR | Status: DC | PRN
  Administered 2020-09-26: 10 mg via INTRAVENOUS

## 2020-09-26 MED ORDER — CHLORHEXIDINE GLUCONATE 0.12 % MT SOLN
OROMUCOSAL | Status: AC
  Administered 2020-09-26: 15 mL via OROMUCOSAL
  Filled 2020-09-26: qty 15

## 2020-09-26 MED ORDER — DEXAMETHASONE SODIUM PHOSPHATE 10 MG/ML IJ SOLN
INTRAMUSCULAR | Status: AC
  Filled 2020-09-26: qty 1

## 2020-09-26 MED ORDER — MIDAZOLAM HCL 2 MG/2ML IJ SOLN
INTRAMUSCULAR | Status: DC | PRN
  Administered 2020-09-26: 2 mg via INTRAVENOUS

## 2020-09-26 MED ORDER — BUPIVACAINE-EPINEPHRINE (PF) 0.5% -1:200000 IJ SOLN
INTRAMUSCULAR | Status: DC | PRN
  Administered 2020-09-26: 30 mL

## 2020-09-26 MED ORDER — PROPOFOL 10 MG/ML IV BOLUS
INTRAVENOUS | Status: DC | PRN
  Administered 2020-09-26: 120 mg via INTRAVENOUS

## 2020-09-26 SURGICAL SUPPLY — 47 items
ADH SKN CLS APL DERMABOND .7 (GAUZE/BANDAGES/DRESSINGS) ×1
APL PRP STRL LF DISP 70% ISPRP (MISCELLANEOUS) ×1
BLADE SURG 15 STRL LF DISP TIS (BLADE) ×1 IMPLANT
BLADE SURG 15 STRL SS (BLADE) ×2
CANISTER SUCT 1200ML W/VALVE (MISCELLANEOUS) ×2 IMPLANT
CHLORAPREP W/TINT 26 (MISCELLANEOUS) ×2 IMPLANT
CNTNR SPEC 2.5X3XGRAD LEK (MISCELLANEOUS) ×1
CONT SPEC 4OZ STER OR WHT (MISCELLANEOUS) ×1
CONT SPEC 4OZ STRL OR WHT (MISCELLANEOUS) ×1
CONTAINER SPEC 2.5X3XGRAD LEK (MISCELLANEOUS) ×1 IMPLANT
COVER WAND RF STERILE (DRAPES) ×2 IMPLANT
DERMABOND ADVANCED (GAUZE/BANDAGES/DRESSINGS) ×1
DERMABOND ADVANCED .7 DNX12 (GAUZE/BANDAGES/DRESSINGS) ×1 IMPLANT
DEVICE DUBIN SPECIMEN MAMMOGRA (MISCELLANEOUS) ×2 IMPLANT
DRAPE LAPAROTOMY TRNSV 106X77 (MISCELLANEOUS) ×2 IMPLANT
ELECT CAUTERY BLADE TIP 2.5 (TIP) ×2
ELECT REM PT RETURN 9FT ADLT (ELECTROSURGICAL) ×2
ELECTRODE CAUTERY BLDE TIP 2.5 (TIP) ×1 IMPLANT
ELECTRODE REM PT RTRN 9FT ADLT (ELECTROSURGICAL) ×1 IMPLANT
GLOVE SURG ENC MOIS LTX SZ6.5 (GLOVE) ×2 IMPLANT
GLOVE SURG UNDER POLY LF SZ6.5 (GLOVE) ×2 IMPLANT
GOWN STRL REUS W/ TWL LRG LVL3 (GOWN DISPOSABLE) ×3 IMPLANT
GOWN STRL REUS W/TWL LRG LVL3 (GOWN DISPOSABLE) ×6
KIT MARKER MARGIN INK (KITS) ×1 IMPLANT
KIT TURNOVER KIT A (KITS) ×2 IMPLANT
LABEL OR SOLS (LABEL) ×2 IMPLANT
MANIFOLD NEPTUNE II (INSTRUMENTS) ×2 IMPLANT
MARGIN MAP 10MM (MISCELLANEOUS) IMPLANT
MARKER MARGIN CORRECT CLIP (MARKER) ×1 IMPLANT
NDL HYPO 25X1 1.5 SAFETY (NEEDLE) ×1 IMPLANT
NEEDLE HYPO 25X1 1.5 SAFETY (NEEDLE) ×2 IMPLANT
PACK BASIN MINOR ARMC (MISCELLANEOUS) ×2 IMPLANT
RETRACTOR RING XSMALL (MISCELLANEOUS) IMPLANT
RTRCTR WOUND ALEXIS 13CM XS SH (MISCELLANEOUS) ×2
SET LOCALIZER 20 PROBE US (MISCELLANEOUS) ×2 IMPLANT
SUT ETHILON 3-0 FS-10 30 BLK (SUTURE)
SUT MNCRL 4-0 (SUTURE) ×2
SUT MNCRL 4-0 27XMFL (SUTURE) ×1
SUT PROLENE 2 0 SH DA (SUTURE) ×1 IMPLANT
SUT SILK 2 0 SH (SUTURE) ×2 IMPLANT
SUT VIC AB 3-0 SH 27 (SUTURE) ×2
SUT VIC AB 3-0 SH 27X BRD (SUTURE) ×1 IMPLANT
SUTURE EHLN 3-0 FS-10 30 BLK (SUTURE) IMPLANT
SUTURE MNCRL 4-0 27XMF (SUTURE) ×1 IMPLANT
SYR 10ML LL (SYRINGE) ×2 IMPLANT
SYR BULB IRRIG 60ML STRL (SYRINGE) ×2 IMPLANT
WATER STERILE IRR 1000ML POUR (IV SOLUTION) ×2 IMPLANT

## 2020-09-26 NOTE — Anesthesia Postprocedure Evaluation (Signed)
Anesthesia Post Note  Patient: Jenny Mcfarland  Procedure(s) Performed: BREAST BIOPSY WITH RADIO FREQUENCY LOCALIZER (Left Breast)  Patient location during evaluation: PACU Anesthesia Type: General Level of consciousness: awake and alert Pain management: pain level controlled Vital Signs Assessment: post-procedure vital signs reviewed and stable Respiratory status: spontaneous breathing, nonlabored ventilation, respiratory function stable and patient connected to nasal cannula oxygen Cardiovascular status: blood pressure returned to baseline and stable Postop Assessment: no apparent nausea or vomiting Anesthetic complications: no   No complications documented.   Last Vitals:  Vitals:   09/26/20 1148 09/26/20 1201  BP: (!) 107/51 (!) 123/53  Pulse: 72 67  Resp: 16 18  Temp: 36.4 C (!) 35.6 C  SpO2: 98% 99%    Last Pain:  Vitals:   09/26/20 1201  TempSrc: Temporal  PainSc: 4                  Arita Miss

## 2020-09-26 NOTE — H&P (Signed)
PATIENT PROFILE: Jenny Mcfarland is a 70 y.o. female who presents to the OR for surgical excisional biopsy of radial scar of the left breast.  PCP: Tower, Carmell Austria, MD  HISTORY OF PRESENT ILLNESS: Jenny Mcfarland reports patient had her usual screening mammogram. On the screening mammogram there was a finding of a distortion of the left breast. This led to diagnostic mammogram. Confirmation of the distortion was persistent on the diagnostic mammogram. Core biopsy was done showing an area of concern of radial scar versus intraductal papilloma. Impression evaluated the images.  Patient denies any pain in the breast. She denies any skin changes. She denies any nipple discharge. She denies any palpable mass.  Family history of breast cancer: Sister Family history of other cancers: Father with lymphoma Menarche: 27 to 14 years old Menopause: 70 years old Used OCP: Yes Used estrogen and progesterone therapy: No History of Radiation to the chest: No Number of pregnancies: 2 Atrial for pregnancy: 27 Previous biopsy: Yes on the contralateral breast  PROBLEM LIST: Problem List Date Reviewed: 01/16/2020  None    GENERAL REVIEW OF SYSTEMS:   General ROS: negative for - chills, fatigue, fever, weight gain or weight loss Allergy and Immunology ROS: negative for - hives  Hematological and Lymphatic ROS: negative for - bleeding problems or bruising, negative for palpable nodes Endocrine ROS: negative for - heat or cold intolerance, hair changes Respiratory ROS: negative for - cough, shortness of breath or wheezing Cardiovascular ROS: no chest pain or palpitations GI ROS: negative for nausea, vomiting, abdominal pain, diarrhea, constipation Musculoskeletal ROS: Positive for - joint swelling or muscle pain Neurological ROS: negative for - confusion, syncope Dermatological ROS: negative for pruritus and rash Psychiatric: negative for anxiety, depression, difficulty sleeping and memory  loss  MEDICATIONS: Current Outpatient Medications  Medication Sig Dispense Refill  . Bifidobacterium infantis (ALIGN) 4 mg capsule Take by mouth as needed  . cholecalciferol (VITAMIN D3) 1000 unit tablet Take 1 tablet by mouth once daily  . dicyclomine (BENTYL) 10 mg capsule Take 1 capsule by mouth 2 (two) times daily as needed  . fexofenadine (ALLEGRA) 180 MG tablet Take 1 tablet by mouth once daily  . fluticasone-salmeterol (ADVAIR DISKUS) 250-50 mcg/dose diskus inhaler Inhale 1 inhalation into the lungs 2 (two) times daily  . levalbuterol (XOPENEX HFA) inhaler Inhale 2 inhalations into the lungs every 4 (four) hours as needed  . omeprazole (PRILOSEC) 20 MG DR capsule Take 1 capsule by mouth 2 (two) times daily  . zolpidem (AMBIEN) 5 MG tablet Take 0.5-1 tablets by mouth nightly as needed  . doxepin 6 mg Tab Take 1 tablet by mouth nightly (Patient not taking: Reported on 09/09/2020 )  . gabapentin (NEURONTIN) 300 MG capsule Take 1 capsule (300 mg total) by mouth nightly for 30 days 30 capsule 0   No current facility-administered medications for this visit.   ALLERGIES: Levaquin [levofloxacin] and Tessalon [benzonatate]  PAST MEDICAL HISTORY: Past Medical History:  Diagnosis Date  . Asthma without status asthmaticus, unspecified  . Barrett's esophagus  . Estrogen deficiency  . Family history of aortic aneurysm  . Family history of breast cancer  . GERD (gastroesophageal reflux disease)  . History of chicken pox  . IBS (irritable bowel syndrome)  . Mild hyperlipidemia  . Osteopenia  . Plantar wart of left foot  . Vitamin D deficiency   PAST SURGICAL HISTORY: Past Surgical History:  Procedure Laterality Date  . CHOLECYSTECTOMY 2008  at [facility]  . Excision of  breast mass Right 09/09/2017  Dr Rochel Brome  . LAPAROSCOPIC TUBAL LIGATION    FAMILY HISTORY: Family History  Problem Relation Age of Onset  . Aneurysm Mother  . COPD Mother  . Heart block Mother  . Heart  disease Mother  . Lymphoma Father  . Breast cancer Sister  . Coronary Artery Disease (Blocked arteries around heart) Brother    SOCIAL HISTORY: Social History   Socioeconomic History  . Marital status: Married  Spouse name: Not on file  . Number of children: Not on file  . Years of education: Not on file  . Highest education level: Not on file  Occupational History  . Not on file  Tobacco Use  . Smoking status: Never Smoker  . Smokeless tobacco: Never Used  Vaping Use  . Vaping Use: Never used  Substance and Sexual Activity  . Alcohol use: Never  . Drug use: Never  . Sexual activity: Not on file  Other Topics Concern  . Not on file  Social History Narrative  . Not on file   Social Determinants of Health   Financial Resource Strain: Not on file  Food Insecurity: Not on file  Transportation Needs: Not on file   PHYSICAL EXAM: Vitals:  09/09/20 1058  BP: 148/71  Pulse: 82   Body mass index is 27.12 kg/m. Weight: 73.9 kg (163 lb)   GENERAL: Alert, active, oriented x3  HEENT: Pupils equal reactive to light. Extraocular movements are intact. Sclera clear. Palpebral conjunctiva normal red color.Pharynx clear.  NECK: Supple with no palpable mass and no adenopathy.  LUNGS: Sound clear with no rales rhonchi or wheezes.  HEART: Regular rhythm S1 and S2 without murmur.  BREAST: breasts appear normal, no suspicious masses, no skin or nipple changes or axillary nodes.  ABDOMEN: Soft and depressible, nontender with no palpable mass, no hepatomegaly.  EXTREMITIES: Well-developed well-nourished symmetrical with no dependent edema.  NEUROLOGICAL: Awake alert oriented, facial expression symmetrical, moving all extremities.  REVIEW OF DATA: I have reviewed the following data today: Initial consult on 09/09/2020  Component Date Value  . Glucose 09/09/2020 92  . Sodium 09/09/2020 142  . Potassium 09/09/2020 4.7  . Chloride 09/09/2020 107  . Carbon Dioxide (CO2)  09/09/2020 32.1 (!)  . Urea Nitrogen (BUN) 09/09/2020 10  . Creatinine 09/09/2020 0.8  . Glomerular Filtration Ra* 09/09/2020 71  . Calcium 09/09/2020 9.7  . AST 09/09/2020 13  . ALT 09/09/2020 12  . Alk Phos (alkaline Phosp* 09/09/2020 76  . Albumin 09/09/2020 4.2  . Bilirubin, Total 09/09/2020 0.7  . Protein, Total 09/09/2020 6.8  . A/G Ratio 09/09/2020 1.6  . WBC (White Blood Cell Co* 09/09/2020 6.3  . RBC (Red Blood Cell Coun* 09/09/2020 4.26  . Hemoglobin 09/09/2020 11.9 (!)  . Hematocrit 09/09/2020 37.8  . MCV (Mean Corpuscular Vo* 09/09/2020 88.7  . MCH (Mean Corpuscular He* 09/09/2020 27.9  . MCHC (Mean Corpuscular H* 09/09/2020 31.5 (!)  . Platelet Count 09/09/2020 261  . RDW-CV (Red Cell Distrib* 09/09/2020 13.2  . MPV (Mean Platelet Volum* 09/09/2020 11.8  . Neutrophils 09/09/2020 3.60  . Lymphocytes 09/09/2020 1.44  . Monocytes 09/09/2020 0.44  . Eosinophils 09/09/2020 0.72 (!)  . Basophils 09/09/2020 0.05  . Neutrophil % 09/09/2020 57.5  . Lymphocyte % 09/09/2020 23.0  . Monocyte % 09/09/2020 7.0  . Eosinophil % 09/09/2020 11.5 (!)  . Basophil% 09/09/2020 0.8  . Immature Granulocyte % 09/09/2020 0.2  . Immature Granulocyte Cou* 09/09/2020 0.01  ASSESSMENT: Ms. Warth is a 70 y.o. female presenting for excisional biopsy of left breast distortion.   Patient was oriented again about the pathology results. Surgical alternatives were discussed with patient including partial vs total mastectomy. Surgical technique and post operative care was discussed with patient. Risk of surgery was discussed with patient including but not limited to: wound infection, seroma, hematoma, brachial plexopathy, mondor's disease (thrombosis of small veins of breast), chronic wound pain, breast lymphedema, altered sensation to the nipple and cosmesis among others.   Due to biopsy unable to confirm a diagnosis and having concern of complex grossing lesions versus intraoperative papilloma I  think that is reasonable to proceed with excisional biopsy for confirmation and to rule out malignancy.  Radial scar of breast [N64.89]  PLAN: 1. Radiofrequency tagged left breast excisional biopsy (19125) 2. CBC, CMP 3. Avoid taking aspirin 5 days before surgery 4. Contact us if you have any concern.   Patient verbalized understanding, all questions were answered, and were agreeable with the plan outlined above.   Herbert Pun, MD  Electronically signed by Herbert Pun, MD

## 2020-09-26 NOTE — Op Note (Signed)
Preoperative diagnosis: Left breast radial scar.  Postoperative diagnosis:  Left breast radial scar  Procedure: Left radiofrequency tag-localized excisional biopsy.                       Anesthesia: GETA  Surgeon: Dr. Windell Moment  Wound Classification: Clean  Indications: Patient is a 70 y.o. female with two nonpalpable left breast masses noted on mammography with core biopsy demonstrating radial scar requires radiofrequency tag-localized excisional biopsy to rule out malignancy.   Findings: 1. Specimen mammography shows the two markers and tags on specimen 2. No other palpable mass or lymph node identified.   Description of procedure: Preoperative radiofrequency tag localization was performed by radiology. Localization studies were reviewed. The patient was taken to the operating room and placed supine on the operating table, and after general anesthesia the left chest and axilla were prepped and draped in the usual sterile fashion. A time-out was completed verifying correct patient, procedure, site, positioning, and implant(s) and/or special equipment prior to beginning this procedure.  By comparing the localization studies and interrogation with Localizer device, the probable trajectory and location of the mass was visualized. A circumareolar skin incision was planned in such a way as to minimize the amount of dissection to reach the mass.  The skin incision was made. Flaps were raised and the location of the tag was confirmed with Localizer device confirmed. A 2-0 silk figure-of-eight stay suture was placed and used for retraction. Dissection was then taken down circumferentially, taking care to include both localizing tags and a wide margin of grossly normal tissue. The specimen and both localizing tags were removed. The specimen was oriented and sent to radiology with the localization studies. Confirmation was received that the entire target lesion had been resected. The wound was  irrigated. Hemostasis was checked. The wound was closed with interrupted sutures of 3-0 Vicryl and a subcuticular suture of Monocryl 3-0. No attempt was made to close the dead space.    Specimen: Left Breast excisional biopsy                     Complications: None  Estimated Blood Loss: 5 mL

## 2020-09-26 NOTE — Anesthesia Preprocedure Evaluation (Signed)
Anesthesia Evaluation  Patient identified by MRN, date of birth, ID band Patient awake    Reviewed: Allergy & Precautions, NPO status , Patient's Chart, lab work & pertinent test results  History of Anesthesia Complications (+) PONV and history of anesthetic complications  Airway Mallampati: III  TM Distance: <3 FB Neck ROM: Full    Dental  (+) Chipped, Caps   Pulmonary asthma , neg sleep apnea, neg COPD, Patient abstained from smoking.Not current smoker,  Daily advair, took that and her albuterol this AM as precaution.   Pulmonary exam normal breath sounds clear to auscultation       Cardiovascular Exercise Tolerance: Good METS(-) hypertension(-) CAD and (-) Past MI negative cardio ROS Normal cardiovascular exam(-) dysrhythmias  Rhythm:Regular Rate:Normal - Systolic murmurs    Neuro/Psych negative neurological ROS  negative psych ROS   GI/Hepatic Neg liver ROS, hiatal hernia, GERD  Medicated and Controlled,  Endo/Other  negative endocrine ROSneg diabetes  Renal/GU negative Renal ROS  negative genitourinary   Musculoskeletal negative musculoskeletal ROS (+)   Abdominal   Peds negative pediatric ROS (+)  Hematology negative hematology ROS (+)   Anesthesia Other Findings Past Medical History: No date: Allergic rhinitis No date: Asthma     Comment:  WELL CONTROLLED 2005: Barrett's esophagus No date: Complication of anesthesia 11/2008: GERD (gastroesophageal reflux disease) No date: Hemorrhoids No date: Hiatal hernia No date: History of chicken pox No date: IBS (irritable bowel syndrome) No date: Osteopenia 11-2008: Peptic stricture of esophagus No date: PONV (postoperative nausea and vomiting)     Comment:  N/V WITH GALLBLADDER No date: Recurrent sinusitis  Reproductive/Obstetrics                             Anesthesia Physical  Anesthesia Plan  ASA: II  Anesthesia Plan:  General   Post-op Pain Management:    Induction: Intravenous, Rapid sequence and Cricoid pressure planned  PONV Risk Score and Plan: 4 or greater and Ondansetron, Dexamethasone and Treatment may vary due to age or medical condition  Airway Management Planned: Oral ETT  Additional Equipment: None  Intra-op Plan:   Post-operative Plan: Extubation in OR  Informed Consent: I have reviewed the patients History and Physical, chart, labs and discussed the procedure including the risks, benefits and alternatives for the proposed anesthesia with the patient or authorized representative who has indicated his/her understanding and acceptance.     Dental advisory given  Plan Discussed with: Surgeon and CRNA  Anesthesia Plan Comments: (Discussed risks of anesthesia with patient, including PONV, sore throat, lip/dental damage. Rare risks discussed as well, such as cardiorespiratory and neurological sequelae. Patient understands.)        Anesthesia Quick Evaluation

## 2020-09-26 NOTE — Anesthesia Procedure Notes (Signed)
Procedure Name: Intubation Performed by: Fletcher-Harrison, Kamri Gotsch, CRNA Pre-anesthesia Checklist: Patient identified, Emergency Drugs available, Suction available and Patient being monitored Patient Re-evaluated:Patient Re-evaluated prior to induction Oxygen Delivery Method: Circle system utilized Preoxygenation: Pre-oxygenation with 100% oxygen Induction Type: IV induction Ventilation: Mask ventilation without difficulty Laryngoscope Size: McGraph and 3 Grade View: Grade I Tube type: Oral Tube size: 6.5 mm Number of attempts: 1 Airway Equipment and Method: Stylet and Oral airway Placement Confirmation: ETT inserted through vocal cords under direct vision,  positive ETCO2,  breath sounds checked- equal and bilateral and CO2 detector Secured at: 21 cm Tube secured with: Tape Dental Injury: Teeth and Oropharynx as per pre-operative assessment        

## 2020-09-26 NOTE — Transfer of Care (Signed)
Immediate Anesthesia Transfer of Care Note  Patient: Jenny Mcfarland  Procedure(s) Performed: BREAST BIOPSY WITH RADIO FREQUENCY LOCALIZER (Left Breast)  Patient Location: PACU  Anesthesia Type:General  Level of Consciousness: awake, drowsy and patient cooperative  Airway & Oxygen Therapy: Patient Spontanous Breathing and Patient connected to face mask oxygen  Post-op Assessment: Report given to RN and Post -op Vital signs reviewed and stable  Post vital signs: Reviewed and stable  Last Vitals:  Vitals Value Taken Time  BP 137/51 09/26/20 1050  Temp    Pulse 83 09/26/20 1055  Resp 15 09/26/20 1055  SpO2 100 % 09/26/20 1055  Vitals shown include unvalidated device data.  Last Pain:  Vitals:   09/26/20 0850  TempSrc: Temporal  PainSc: 0-No pain         Complications: No complications documented.

## 2020-09-26 NOTE — Discharge Instructions (Signed)
AMBULATORY SURGERY  DISCHARGE INSTRUCTIONS   1) The drugs that you were given will stay in your system until tomorrow so for the next 24 hours you should not:  A) Drive an automobile B) Make any legal decisions C) Drink any alcoholic beverage   2) You may resume regular meals tomorrow.  Today it is better to start with liquids and gradually work up to solid foods.  You may eat anything you prefer, but it is better to start with liquids, then soup and crackers, and gradually work up to solid foods.   3) Please notify your doctor immediately if you have any unusual bleeding, trouble breathing, redness and pain at the surgery site, drainage, fever, or pain not relieved by medication.    4) Additional Instructions:        Please contact your physician with any problems or Same Day Surgery at [phone], Monday through Friday 6 am to 4 pm, or [place] [facility] number at [phone]. Diet: Resume home heart healthy regular diet.   Activity: No heavy lifting >20 pounds (children, pets, laundry, garbage) or strenuous activity until follow-up, but light activity and walking are encouraged. Do not drive or drink alcohol if taking narcotic pain medications.  Wound care: May shower with soapy water and pat dry (do not rub incisions), but no baths or submerging incision underwater until follow-up. (no swimming)   Medications: Resume all home medications. For mild to moderate pain: acetaminophen (Tylenol) or ibuprofen (if no kidney disease). Combining Tylenol with alcohol can substantially increase your risk of causing liver disease. Narcotic pain medications, if prescribed, can be used for severe pain, though may cause nausea, constipation, and drowsiness. Do not combine Tylenol and Norco within a 6 hour period as Norco contains Tylenol. If you do not need the narcotic pain medication, you do not need to fill the prescription.  Call office ([phone]) at any time if any  questions, worsening pain, fevers/chills, bleeding, drainage from incision site, or other concerns.  

## 2020-09-30 LAB — SURGICAL PATHOLOGY

## 2021-01-18 ENCOUNTER — Telehealth: Payer: Self-pay | Admitting: Family Medicine

## 2021-01-18 DIAGNOSIS — E559 Vitamin D deficiency, unspecified: Secondary | ICD-10-CM

## 2021-01-18 DIAGNOSIS — Z79899 Other long term (current) drug therapy: Secondary | ICD-10-CM

## 2021-01-18 DIAGNOSIS — E785 Hyperlipidemia, unspecified: Secondary | ICD-10-CM

## 2021-01-18 DIAGNOSIS — Z0000 Encounter for general adult medical examination without abnormal findings: Secondary | ICD-10-CM

## 2021-01-18 NOTE — Telephone Encounter (Signed)
-----   Message from Ellamae Sia sent at 01/05/2021 10:43 AM EDT ----- Regarding: Lab orders for Monday, 5.23.22 Patient is scheduled for CPX labs, please order future labs, Thanks , Karna Christmas

## 2021-01-19 ENCOUNTER — Encounter: Payer: Self-pay | Admitting: Family Medicine

## 2021-01-19 ENCOUNTER — Other Ambulatory Visit: Payer: Self-pay

## 2021-01-19 ENCOUNTER — Other Ambulatory Visit (INDEPENDENT_AMBULATORY_CARE_PROVIDER_SITE_OTHER): Payer: Medicare HMO

## 2021-01-19 DIAGNOSIS — Z79899 Other long term (current) drug therapy: Secondary | ICD-10-CM | POA: Diagnosis not present

## 2021-01-19 DIAGNOSIS — E785 Hyperlipidemia, unspecified: Secondary | ICD-10-CM | POA: Diagnosis not present

## 2021-01-19 DIAGNOSIS — Z0000 Encounter for general adult medical examination without abnormal findings: Secondary | ICD-10-CM

## 2021-01-19 DIAGNOSIS — E538 Deficiency of other specified B group vitamins: Secondary | ICD-10-CM | POA: Insufficient documentation

## 2021-01-19 DIAGNOSIS — E559 Vitamin D deficiency, unspecified: Secondary | ICD-10-CM | POA: Diagnosis not present

## 2021-01-19 LAB — CBC WITH DIFFERENTIAL/PLATELET
Basophils Absolute: 0 10*3/uL (ref 0.0–0.1)
Basophils Relative: 0.8 % (ref 0.0–3.0)
Eosinophils Absolute: 0.7 10*3/uL (ref 0.0–0.7)
Eosinophils Relative: 12.9 % — ABNORMAL HIGH (ref 0.0–5.0)
HCT: 35.6 % — ABNORMAL LOW (ref 36.0–46.0)
Hemoglobin: 11.8 g/dL — ABNORMAL LOW (ref 12.0–15.0)
Lymphocytes Relative: 28.4 % (ref 12.0–46.0)
Lymphs Abs: 1.6 10*3/uL (ref 0.7–4.0)
MCHC: 33.1 g/dL (ref 30.0–36.0)
MCV: 83.5 fl (ref 78.0–100.0)
Monocytes Absolute: 0.3 10*3/uL (ref 0.1–1.0)
Monocytes Relative: 5.5 % (ref 3.0–12.0)
Neutro Abs: 2.9 10*3/uL (ref 1.4–7.7)
Neutrophils Relative %: 52.4 % (ref 43.0–77.0)
Platelets: 232 10*3/uL (ref 150.0–400.0)
RBC: 4.27 Mil/uL (ref 3.87–5.11)
RDW: 13.9 % (ref 11.5–15.5)
WBC: 5.6 10*3/uL (ref 4.0–10.5)

## 2021-01-19 LAB — COMPREHENSIVE METABOLIC PANEL
ALT: 11 U/L (ref 0–35)
AST: 12 U/L (ref 0–37)
Albumin: 4 g/dL (ref 3.5–5.2)
Alkaline Phosphatase: 75 U/L (ref 39–117)
BUN: 9 mg/dL (ref 6–23)
CO2: 29 mEq/L (ref 19–32)
Calcium: 9.5 mg/dL (ref 8.4–10.5)
Chloride: 107 mEq/L (ref 96–112)
Creatinine, Ser: 0.81 mg/dL (ref 0.40–1.20)
GFR: 74.16 mL/min (ref >60.00)
Glucose, Bld: 88 mg/dL (ref 70–99)
Potassium: 4.5 mEq/L (ref 3.5–5.1)
Sodium: 143 mEq/L (ref 135–145)
Total Bilirubin: 0.9 mg/dL (ref 0.2–1.2)
Total Protein: 6.5 g/dL (ref 6.0–8.3)

## 2021-01-19 LAB — LIPID PANEL
Cholesterol: 207 mg/dL — ABNORMAL HIGH (ref 0–200)
HDL: 73.1 mg/dL (ref >39.00)
LDL Cholesterol: 116 mg/dL — ABNORMAL HIGH (ref 0–99)
NonHDL: 133.61
Total CHOL/HDL Ratio: 3
Triglycerides: 88 mg/dL (ref 0.0–149.0)
VLDL: 17.6 mg/dL (ref 0.0–40.0)

## 2021-01-19 LAB — VITAMIN B12: Vitamin B-12: 94 pg/mL — ABNORMAL LOW (ref 211–911)

## 2021-01-19 LAB — VITAMIN D 25 HYDROXY (VIT D DEFICIENCY, FRACTURES): VITD: 20.79 ng/mL — ABNORMAL LOW (ref 30.00–100.00)

## 2021-01-19 LAB — TSH: TSH: 2.75 u[IU]/mL (ref 0.35–4.50)

## 2021-01-20 ENCOUNTER — Ambulatory Visit (INDEPENDENT_AMBULATORY_CARE_PROVIDER_SITE_OTHER): Payer: Medicare HMO

## 2021-01-20 DIAGNOSIS — Z0000 Encounter for general adult medical examination without abnormal findings: Secondary | ICD-10-CM

## 2021-01-20 NOTE — Progress Notes (Signed)
Subjective:   Jenny Mcfarland is a 70 y.o. female who presents for Medicare Annual (Subsequent) preventive examination.  Review of Systems: N/A      I connected with the patient today by telephone and verified that I am speaking with the correct person using two identifiers. Location patient: home Location nurse: work Persons participating in the telephone visit: patient, nurse.   I discussed the limitations, risks, security and privacy concerns of performing an evaluation and management service by telephone and the availability of in person appointments. I also discussed with the patient that there may be a patient responsible charge related to this service. The patient expressed understanding and verbally consented to this telephonic visit.        Cardiac Risk Factors include: advanced age (>38men, >24 women)     Objective:    Today's Vitals   There is no height or weight on file to calculate BMI.  Advanced Directives 01/20/2021 09/26/2020 09/19/2020 01/06/2020 01/04/2020 01/01/2019 12/22/2017  Does Patient Have a Medical Advance Directive? Yes Yes Yes Yes Yes Yes Yes  Type of Paramedic of [place];Living will Healthcare Power of [place];Living will [place];Living will [place];Living will Living will;Healthcare Power of Attorney  Does patient want to make changes to medical advance directive? - No - Patient declined - No - Patient declined - - -  Copy of [place] in Chart? Yes - validated most recent copy scanned in chart (See row information) No - copy requested - Yes - validated most recent copy scanned in chart (See row information) Yes - validated most recent copy scanned in chart (See row information) No - copy requested No - copy requested    Current Medications (verified) Outpatient Encounter Medications as of 01/20/2021  Medication Sig  . cholecalciferol  (VITAMIN D) 25 MCG (1000 UT) tablet Take 3,000 Units by mouth 3 (three) times a week.  . dicyclomine (BENTYL) 10 MG capsule Take 1 capsule by mouth as needed.  . fexofenadine (ALLEGRA) 180 MG tablet Take 180 mg by mouth every morning.  . Fluticasone-Salmeterol (ADVAIR DISKUS) 250-50 MCG/DOSE AEPB Inhale 1 puff into the lungs 2 (two) times daily as needed. (Patient taking differently: Inhale 1 puff into the lungs 2 (two) times daily.)  . levalbuterol (XOPENEX HFA) 45 MCG/ACT inhaler Inhale 1 puff into the lungs every 4 (four) hours as needed for wheezing.  Marland Kitchen omeprazole (PRILOSEC) 20 MG capsule Take 1 capsule (20 mg total) by mouth 2 (two) times daily before a meal.  . Probiotic Product (ALIGN) 4 MG CAPS Take 1 capsule by mouth daily as needed (upset stomach).  . zolpidem (AMBIEN) 5 MG tablet Take 0.5-1 tablets (2.5-5 mg total) by mouth at bedtime.   No facility-administered encounter medications on file as of 01/20/2021.    Allergies (verified) Levofloxacin and Tessalon [benzonatate]   History: Past Medical History:  Diagnosis Date  . Allergic rhinitis   . Arthritis   . Asthma    WELL CONTROLLED  . Barrett's esophagus 2005  . Complication of anesthesia   . GERD (gastroesophageal reflux disease) 11/2008  . Hemorrhoids   . Hiatal hernia    SMALL  . History of chicken pox   . IBS (irritable bowel syndrome)   . Osteopenia   . Peptic stricture of esophagus 11-2008  . PONV (postoperative nausea and vomiting)    N/V WITH GALLBLADDER  . Recurrent sinusitis    Past  Surgical History:  Procedure Laterality Date  . BREAST BIOPSY Right 07/26/2017   INTRADUCTAL PAPILLOMA.   Marland Kitchen BREAST BIOPSY Left 08/27/2020   affirm bx #1-"X" clip-path pending  . BREAST BIOPSY Left 08/27/2020   affirm bx-#2-"coil: clip-path pending  . BREAST BIOPSY WITH RADIO FREQUENCY LOCALIZER Left 09/26/2020   Procedure: BREAST BIOPSY WITH RADIO FREQUENCY LOCALIZER;  Surgeon: Herbert Pun, MD;  Location: [facility] ORS;   Service: General;  Laterality: Left;  . BREAST LUMPECTOMY Right 08/2017  . BREAST LUMPECTOMY WITH NEEDLE LOCALIZATION Right 09/09/2017   Procedure: BREAST LUMPECTOMY WITH NEEDLE LOCALIZATION;  Surgeon: Leonie Green, MD;  Location: [facility] ORS;  Service: General;  Laterality: Right;  . CHOLECYSTECTOMY  2010  . TUBAL LIGATION     Family History  Problem Relation Age of Onset  . Aortic aneurysm Mother   . Heart disease Mother   . Heart attack Brother        early 9's  . Arthritis Brother   . Breast cancer Sister 32  . Colon cancer Neg Hx    Social History   Socioeconomic History  . Marital status: Single    Spouse name: Not on file  . Number of children: 2  . Years of education: Not on file  . Highest education level: Not on file  Occupational History  . 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 Smoking status: Never Smoker  . Smokeless tobacco: Never Used  Vaping Use  . Vaping Use: Never used  Substance and Sexual Activity  . Alcohol use: Yes    Alcohol/week: 0.0 standard drinks    Comment: RARE  . Drug use: No  . Sexual activity: Not Currently  Other Topics Concern  . Not on file  Social History Narrative   Daily caffeine use - soft drinks/tea       Separating from husband 1/17   Social Determinants of Health   Financial Resource Strain: Low Risk   . Difficulty of Paying Living Expenses: Not hard at all  Food Insecurity: No Food Insecurity  . Worried About Charity fundraiser in the Last Year: Never true  . Ran Out of Food in the Last Year: Never true  Transportation Needs: No Transportation Needs  . Lack of Transportation (Medical): No  . Lack of Transportation (Non-Medical): No  Physical Activity: Sufficiently Active  . Days of Exercise per Week: 7 days  . Minutes of Exercise per Session: 30 min  Stress: No Stress Concern Present  . Feeling of Stress : Not at all  Social Connections: Not on file    Tobacco Counseling Counseling  given: Not Answered   Clinical Intake:  Pre-visit preparation completed: Yes  Pain : No/denies pain     Nutritional Risks: None Diabetes: No  How often do you need to have someone help you when you read instructions, pamphlets, or other written materials from your doctor or pharmacy?: 1 - Never What is the last grade level you completed in school?: 12th  Diabetic: No Nutrition Risk Assessment:  Has the patient had any N/V/D within the last 2 months?  No  Does the patient have any non-healing wounds?  No  Has the patient had any unintentional weight loss or weight gain?  No   Diabetes:  Is the patient diabetic?  No  If diabetic, was a CBG obtained today?  N/A Did the patient bring in their glucometer from home?  N/A How often do you monitor your CBG's? N/A.   Financial  Strains and Diabetes Management:  Are you having any financial strains with the device, your supplies or your medication? N/A.  Does the patient want to be seen by Chronic Care Management for management of their diabetes?  N/A Would the patient like to be referred to a Nutritionist or for Diabetic Management?  N/A   Interpreter Needed?: No  Information entered by :: CJohnson, LPN   Activities of Daily Living In your present state of health, do you have any difficulty performing the following activities: 01/20/2021 09/19/2020  Hearing? N N  Vision? N N  Difficulty concentrating or making decisions? N N  Walking or climbing stairs? N N  Dressing or bathing? N N  Doing errands, shopping? N N  Preparing Food and eating ? N -  Using the Toilet? N -  In the past six months, have you accidently leaked urine? N -  Do you have problems with loss of bowel control? N -  Managing your Medications? N -  Managing your Finances? N -  Housekeeping or managing your Housekeeping? N -  Some recent data might be hidden    Patient Care Team: Tower, [name], MD as PCP - General (Family Medicine) Ladene Artist,  MD as Consulting Physician (Gastroenterology)  Indicate any recent Medical Services you may have received from other than [facility] providers in the past year (date may be approximate).     Assessment:   This is a routine wellness examination for Chika.  Hearing/Vision screen  Hearing Screening   125Hz  250Hz  500Hz  1000Hz  2000Hz  3000Hz  4000Hz  6000Hz  8000Hz   Right ear:           Left ear:           Vision Screening Comments: Patient gets annual eye exams   Dietary issues and exercise activities discussed: Current Exercise Habits: Home exercise routine, Type of exercise: walking, Time (Minutes): 30, Frequency (Times/Week): 7, Weekly Exercise (Minutes/Week): 210, Intensity: Moderate, Exercise limited by: None identified  Goals Addressed            This Visit's Progress   . Patient Stated       01/20/2021, I will continue to walk daily for about 30 minutes.       Depression Screen PHQ 2/9 Scores 01/20/2021 01/04/2020 01/01/2019 12/22/2017 06/17/2017 12/21/2016 09/12/2012  PHQ - 2 Score 0 0 0 0 0 0 0  PHQ- 9 Score 0 0 0 0 - - -    Fall Risk Fall Risk  01/20/2021 01/04/2020 01/01/2019 12/22/2017 06/17/2017  Falls in the past year? 0 0 0 No No  Number falls in past yr: 0 0 - - -  Injury with Fall? 0 0 - - -  Risk for fall due to : No Fall Risks No Fall Risks - - -  Follow up Falls evaluation completed;Falls prevention discussed Falls evaluation completed;Falls prevention discussed - - -    FALL RISK PREVENTION PERTAINING TO THE HOME:  Any stairs in or around the home? Yes  If so, are there any without handrails? No  Home free of loose throw rugs in walkways, pet beds, electrical cords, etc? Yes  Adequate lighting in your home to reduce risk of falls? Yes   ASSISTIVE DEVICES UTILIZED TO PREVENT FALLS:  Life alert? No  Use of a cane, walker or w/c? No  Grab bars in the bathroom? No  Shower chair or bench in shower? No  Elevated toilet seat or a handicapped toilet? No   TIMED UP AND  GO:  Was the test performed? N/A telephone visit .    Cognitive Function: MMSE - Mini Mental State Exam 01/20/2021 01/04/2020 01/01/2019 12/22/2017  Orientation to time 5 5 5 5   Orientation to Place 5 5 5 5   Registration 3 3 3 3   Attention/ Calculation 5 5 0 0  Recall 3 3 3 3   Language- name 2 objects - - 0 0  Language- repeat 1 1 1 1   Language- follow 3 step command - - 0 3  Language- read & follow direction - - 0 0  Write a sentence - - 0 0  Copy design - - 0 0  Total score - - 17 20  Mini Cog  Mini-Cog screen was completed. Maximum score is 22. A value of 0 denotes this part of the MMSE was not completed or the patient failed this part of the Mini-Cog screening.       Immunizations Immunization History  Administered Date(s) Administered  . Influenza Split 06/21/2012  . Influenza,inj,Quad PF,6+ Mos 06/13/2013, 07/01/2014, 06/16/2015, 06/25/2016, 06/17/2017, 06/29/2018, 06/19/2019, 05/21/2020  . PFIZER(Purple Top)SARS-COV-2 Vaccination 10/10/2019, 10/31/2019, 08/03/2020  . Pneumococcal Conjugate-13 12/21/2016  . Tdap 05/25/2011    TDAP status: Up to date  Flu Vaccine status: Up to date  Pneumococcal vaccine status: Due, Education has been provided regarding the importance of this vaccine. Advised may receive this vaccine at local pharmacy or Health Dept. Aware to provide a copy of the vaccination record if obtained from local pharmacy or Health Dept. Verbalized acceptance and understanding.  Covid-19 vaccine status: Completed 3 vaccines.  Qualifies for Shingles Vaccine? Yes   Zostavax completed No   Shingrix Completed?: No.    Education has been provided regarding the importance of this vaccine. Patient has been advised to call insurance company to determine out of pocket expense if they have not yet received this vaccine. Advised may also receive vaccine at local pharmacy or Health Dept. Verbalized acceptance and understanding.  Screening Tests Health Maintenance  Topic  Date Due  . Hepatitis C Screening  Never done  . PNA vac Low Risk Adult (2 of 2 - PPSV23) 12/21/2017  . DEXA SCAN  07/17/2020  . COVID-19 Vaccine (4 - Booster for Pfizer series) 11/01/2020  . COLONOSCOPY (Pts 45-106yrs Insurance coverage will need to be confirmed)  03/12/2021  . INFLUENZA VACCINE  03/30/2021  . TETANUS/TDAP  05/24/2021  . MAMMOGRAM  07/28/2021  . HPV VACCINES  Aged Out    Health Maintenance  Health Maintenance Due  Topic Date Due  . Hepatitis C Screening  Never done  . PNA vac Low Risk Adult (2 of 2 - PPSV23) 12/21/2017  . DEXA SCAN  07/17/2020  . COVID-19 Vaccine (4 - Booster for Pfizer series) 11/01/2020    Colorectal cancer screening: Type of screening: Colonoscopy. Completed 03/12/2016. Repeat every 5 years  Mammogram status: Completed 07/28/2020. Repeat every year  Bone Density status: due, will discuss with provider at next visit  Lung Cancer Screening: (Low Dose CT Chest recommended if Age 93-80 years, 30 pack-year currently smoking OR have quit w/in 15 years.) does not qualify.   Additional Screening:  Hepatitis C Screening: does qualify; Completed due  Vision Screening: Recommended annual ophthalmology exams for early detection of glaucoma and other disorders of the eye. Is the patient up to date with their annual eye exam?  Yes  Who is the provider or what is the name of the office in which the patient attends annual eye exams? [facility]  If pt is not established with a provider, would they like to be referred to a provider to establish care? No .   Dental Screening: Recommended annual dental exams for proper oral hygiene  Community Resource Referral / Chronic Care Management: CRR required this visit?  No   CCM required this visit?  No      Plan:     I have personally reviewed and noted the following in the patient's chart:   . Medical and social history . Use of alcohol, tobacco or illicit drugs  . Current medications and  supplements including opioid prescriptions.  . Functional ability and status . Nutritional status . Physical activity . Advanced directives . List of other physicians . Hospitalizations, surgeries, and ER visits in previous 12 months . Vitals . Screenings to include cognitive, depression, and falls . Referrals and appointments  In addition, I have reviewed and discussed with patient certain preventive protocols, quality metrics, and best practice recommendations. A written personalized care plan for preventive services as well as general preventive health recommendations were provided to patient.   Due to this being a telephonic visit, the after visit summary with patients personalized plan was offered to patient via office or my-chart. Patient preferred to pick up at office at next visit or via mychart.   Andrez Grime, LPN   01/27/1039

## 2021-01-20 NOTE — Progress Notes (Signed)
PCP notes:  Health Maintenance: Pneumococcal 23- due Covid booster- due Dexa- due Hep C- due    Abnormal Screenings: none   Patient concerns: none   Nurse concerns: none   Next PCP appt.: 01/27/2021 @ 11:30 am

## 2021-01-20 NOTE — Patient Instructions (Signed)
Ms. Jenny Mcfarland , Thank you for taking time to come for your Medicare Wellness Visit. I appreciate your ongoing commitment to your health goals. Please review the following plan we discussed and let me know if I can assist you in the future.   Screening recommendations/referrals: Colonoscopy: Up to date, completed 03/12/2016, due 02/2021 Mammogram: Up to date, completed 07/28/2020, due 06/2021 Bone Density: due, will discuss with provider  Recommended yearly ophthalmology/optometry visit for glaucoma screening and checkup Recommended yearly dental visit for hygiene and checkup  Vaccinations: Influenza vaccine: Up to date, completed 05/21/2020, due 03/2021 Pneumococcal vaccine: due, will get at upcoming physical  Tdap vaccine: Up to date, completed 05/25/2011, due 04/2021 Shingles vaccine: due, check with your insurance regarding coverage if interested    Covid-19:completed 3 vaccines, booster due. Will discuss with provider.  Advanced directives: copy in chart  Conditions/risks identified: none   Next appointment: Follow up in one year for your annual wellness visit    Preventive Care 65 Years and Older, Female Preventive care refers to lifestyle choices and visits with your health care provider that can promote health and wellness. What does preventive care include?  A yearly physical exam. This is also called an annual well check.  Dental exams once or twice a year.  Routine eye exams. Ask your health care provider how often you should have your eyes checked.  Personal lifestyle choices, including:  Daily care of your teeth and gums.  Regular physical activity.  Eating a healthy diet.  Avoiding tobacco and drug use.  Limiting alcohol use.  Practicing safe sex.  Taking low-dose aspirin every day.  Taking vitamin and mineral supplements as recommended by your health care provider. What happens during an annual well check? The services and screenings done by your health care  provider during your annual well check will depend on your age, overall health, lifestyle risk factors, and family history of disease. Counseling  Your health care provider may ask you questions about your:  Alcohol use.  Tobacco use.  Drug use.  Emotional well-being.  Home and relationship well-being.  Sexual activity.  Eating habits.  History of falls.  Memory and ability to understand (cognition).  Work and work Statistician.  Reproductive health. Screening  You may have the following tests or measurements:  Height, weight, and BMI.  Blood pressure.  Lipid and cholesterol levels. These may be checked every 5 years, or more frequently if you are over 41 years old.  Skin check.  Lung cancer screening. You may have this screening every year starting at age 5 if you have a 30-pack-year history of smoking and currently smoke or have quit within the past 15 years.  Fecal occult blood test (FOBT) of the stool. You may have this test every year starting at age 44.  Flexible sigmoidoscopy or colonoscopy. You may have a sigmoidoscopy every 5 years or a colonoscopy every 10 years starting at age 57.  Hepatitis C blood test.  Hepatitis B blood test.  Sexually transmitted disease (STD) testing.  Diabetes screening. This is done by checking your blood sugar (glucose) after you have not eaten for a while (fasting). You may have this done every 1-3 years.  Bone density scan. This is done to screen for osteoporosis. You may have this done starting at age 75.  Mammogram. This may be done every 1-2 years. Talk to your health care provider about how often you should have regular mammograms. Talk with your health care provider about your test  results, treatment options, and if necessary, the need for more tests. Vaccines  Your health care provider may recommend certain vaccines, such as:  Influenza vaccine. This is recommended every year.  Tetanus, diphtheria, and acellular  pertussis (Tdap, Td) vaccine. You may need a Td booster every 10 years.  Zoster vaccine. You may need this after age 43.  Pneumococcal 13-valent conjugate (PCV13) vaccine. One dose is recommended after age 75.  Pneumococcal polysaccharide (PPSV23) vaccine. One dose is recommended after age 62. Talk to your health care provider about which screenings and vaccines you need and how often you need them. This information is not intended to replace advice given to you by your health care provider. Make sure you discuss any questions you have with your health care provider. Document Released: 09/12/2015 Document Revised: 05/05/2016 Document Reviewed: 06/17/2015 Elsevier Interactive Patient Education  2017 [employer] Prevention in the Home Falls can cause injuries. They can happen to people of all ages. There are many things you can do to make your home safe and to help prevent falls. What can I do on the outside of my home?  Regularly fix the edges of walkways and driveways and fix any cracks.  Remove anything that might make you trip as you walk through a door, such as a raised step or threshold.  Trim any bushes or trees on the path to your home.  Use bright outdoor lighting.  Clear any walking paths of anything that might make someone trip, such as rocks or tools.  Regularly check to see if handrails are loose or broken. Make sure that both sides of any steps have handrails.  Any raised decks and porches should have guardrails on the edges.  Have any leaves, snow, or ice cleared regularly.  Use sand or salt on walking paths during winter.  Clean up any spills in your garage right away. This includes oil or grease spills. What can I do in the bathroom?  Use night lights.  Install grab bars by the toilet and in the tub and shower. Do not use towel bars as grab bars.  Use non-skid mats or decals in the tub or shower.  If you need to sit down in the shower, use a plastic,  non-slip stool.  Keep the floor dry. Clean up any water that spills on the floor as soon as it happens.  Remove soap buildup in the tub or shower regularly.  Attach bath mats securely with double-sided non-slip rug tape.  Do not have throw rugs and other things on the floor that can make you trip. What can I do in the bedroom?  Use night lights.  Make sure that you have a light by your bed that is easy to reach.  Do not use any sheets or blankets that are too big for your bed. They should not hang down onto the floor.  Have a firm chair that has side arms. You can use this for support while you get dressed.  Do not have throw rugs and other things on the floor that can make you trip. What can I do in the kitchen?  Clean up any spills right away.  Avoid walking on wet floors.  Keep items that you use a lot in easy-to-reach places.  If you need to reach something above you, use a strong step stool that has a grab bar.  Keep electrical cords out of the way.  Do not use floor polish or wax that makes  floors slippery. If you must use wax, use non-skid floor wax.  Do not have throw rugs and other things on the floor that can make you trip. What can I do with my stairs?  Do not leave any items on the stairs.  Make sure that there are handrails on both sides of the stairs and use them. Fix handrails that are broken or loose. Make sure that handrails are as long as the stairways.  Check any carpeting to make sure that it is firmly attached to the stairs. Fix any carpet that is loose or worn.  Avoid having throw rugs at the top or bottom of the stairs. If you do have throw rugs, attach them to the floor with carpet tape.  Make sure that you have a light switch at the top of the stairs and the bottom of the stairs. If you do not have them, ask someone to add them for you. What else can I do to help prevent falls?  Wear shoes that:  Do not have high heels.  Have rubber  bottoms.  Are comfortable and fit you well.  Are closed at the toe. Do not wear sandals.  If you use a stepladder:  Make sure that it is fully opened. Do not climb a closed stepladder.  Make sure that both sides of the stepladder are locked into place.  Ask someone to hold it for you, if possible.  Clearly mark and make sure that you can see:  Any grab bars or handrails.  First and last steps.  Where the edge of each step is.  Use tools that help you move around (mobility aids) if they are needed. These include:  Canes.  Walkers.  Scooters.  Crutches.  Turn on the lights when you go into a dark area. Replace any light bulbs as soon as they burn out.  Set up your furniture so you have a clear path. Avoid moving your furniture around.  If any of your floors are uneven, fix them.  If there are any pets around you, be aware of where they are.  Review your medicines with your doctor. Some medicines can make you feel dizzy. This can increase your chance of falling. Ask your doctor what other things that you can do to help prevent falls. This information is not intended to replace advice given to you by your health care provider. Make sure you discuss any questions you have with your health care provider. Document Released: 06/12/2009 Document Revised: 01/22/2016 Document Reviewed: 09/20/2014 Elsevier Interactive Patient Education  2017 [employer].

## 2021-01-27 ENCOUNTER — Encounter: Payer: Self-pay | Admitting: Family Medicine

## 2021-01-27 ENCOUNTER — Ambulatory Visit (INDEPENDENT_AMBULATORY_CARE_PROVIDER_SITE_OTHER): Payer: Medicare HMO | Admitting: Family Medicine

## 2021-01-27 ENCOUNTER — Other Ambulatory Visit: Payer: Self-pay

## 2021-01-27 VITALS — BP 122/72 | HR 62 | Temp 98.2°F | Resp 18 | Ht 65.0 in | Wt 165.4 lb

## 2021-01-27 DIAGNOSIS — H9193 Unspecified hearing loss, bilateral: Secondary | ICD-10-CM

## 2021-01-27 DIAGNOSIS — Z0001 Encounter for general adult medical examination with abnormal findings: Secondary | ICD-10-CM | POA: Diagnosis not present

## 2021-01-27 DIAGNOSIS — E559 Vitamin D deficiency, unspecified: Secondary | ICD-10-CM | POA: Diagnosis not present

## 2021-01-27 DIAGNOSIS — N950 Postmenopausal bleeding: Secondary | ICD-10-CM | POA: Insufficient documentation

## 2021-01-27 DIAGNOSIS — Z79899 Other long term (current) drug therapy: Secondary | ICD-10-CM | POA: Diagnosis not present

## 2021-01-27 DIAGNOSIS — M8589 Other specified disorders of bone density and structure, multiple sites: Secondary | ICD-10-CM | POA: Diagnosis not present

## 2021-01-27 DIAGNOSIS — Z0000 Encounter for general adult medical examination without abnormal findings: Secondary | ICD-10-CM

## 2021-01-27 DIAGNOSIS — E538 Deficiency of other specified B group vitamins: Secondary | ICD-10-CM

## 2021-01-27 DIAGNOSIS — Z23 Encounter for immunization: Secondary | ICD-10-CM | POA: Diagnosis not present

## 2021-01-27 DIAGNOSIS — H9190 Unspecified hearing loss, unspecified ear: Secondary | ICD-10-CM | POA: Insufficient documentation

## 2021-01-27 DIAGNOSIS — E785 Hyperlipidemia, unspecified: Secondary | ICD-10-CM | POA: Diagnosis not present

## 2021-01-27 NOTE — Assessment & Plan Note (Signed)
Bilateral cerumen semi impaction Pt wants to clear this before considering audiology eval  Will use debrox or similar product and update

## 2021-01-27 NOTE — Assessment & Plan Note (Addendum)
Lab Results  Component Value Date   VITAMINB12 94 (L) 01/19/2021   Pt very much wants to avoid injections  Started 1000 mcg B12 daily  Re check level in 2-4 wk If not improving will need inj  The ppi may hinder GI abs Disc imp of B12 to general and neurologic health

## 2021-01-27 NOTE — Assessment & Plan Note (Signed)
Due for 2 y dexa-pt plans to call for ref in nov to get at the same time as her mammogram  She will call for that order in the fall No falls or fx  D level is low -agreed to try vit D3 2000 iu daily (instead of several times weekly)  Walking and yard work for exercise

## 2021-01-27 NOTE — Assessment & Plan Note (Signed)
Disc goals for lipids and reasons to control them Rev last labs with pt Rev low sat fat diet in detail  LDL is down to 116 HDL into 70s Improved Enc her to keep eating better

## 2021-01-27 NOTE — Assessment & Plan Note (Signed)
This may add to poor abs of vit D and B12

## 2021-01-27 NOTE — Assessment & Plan Note (Signed)
Level in 20s  Only took D otc several times weekly (thought it caused constipation)  Adv to inc to 2000 iu daily (if constipation consider stool softener) Disc imp to bone and overall health

## 2021-01-27 NOTE — Progress Notes (Signed)
Subjective:    Patient ID: Jenny Mcfarland, female    DOB: [birth year], 70 y.o.   MRN: [id number]  This visit occurred during the SARS-CoV-2 public health emergency.  Safety protocols were in place, including screening questions prior to the visit, additional usage of staff PPE, and extensive cleaning of exam room while observing appropriate contact time as indicated for disinfecting solutions.    HPI Here for health maintenance exam and to review chronic medical problems    Wt Readings from Last 3 Encounters:  01/27/21 165 lb 6.4 oz (75 kg)  09/26/20 165 lb (74.8 kg)  04/01/20 164 lb (74.4 kg)   27.52 kg/m Has been doing well overall  Had breast surgery - healed well   She had amw on 01/20/21 Noted she is due for pna 23 vaccine -will do today  Also dexa   Zoster status -has not had shingrix  Had covid vaccines plus booster (not planning 2nd booster)  She did have some side effects from the last booster   Had a few drops of vaginal bleeding (no period in over 74 y) Around the time of the booster and her surg   Eye exam is scheduled at [facility] on June 6 Failed hearing exam today (worse on the L) L ear itches and sometimes gets wax or a pimple in it  Pt thinks she is turning the TV up  Hears some people better than others    dexa 11/19- osteopenia (will call to schedule dexa)  Falls-none fx -none  Vit D -level is down at 20.7 She then started 5000 iu every 4-5 days , it constipates her  Exercise - walking /yard work  (lots of heavy lifting)   Colonoscopy 02/2016 with 5 y recall- so due in July (pt is aware and may have egd also)   Mammogram 11/21 - 2 areas found and biopsy recommended She saw a surgeon and was diagnosed with intraductal papilloma of L breast  Had surgery in march  Yearly mammograms were recommended going forward  Sister had breast cancer at age 14 Self breast exam   GERD-takes omeprazole  B12 found to be low Lab Results  Component Value  Date   VITAMINB12 94 (L) 01/19/2021   She does not want shots She started 1000 mg daily    Lab Results  Component Value Date   WBC 5.6 01/19/2021   HGB 11.8 (L) 01/19/2021   HCT 35.6 (L) 01/19/2021   MCV 83.5 01/19/2021   PLT 232.0 01/19/2021   Very slt low    Hyperlipidemia Lab Results  Component Value Date   CHOL 207 (H) 01/19/2021   CHOL 216 (H) 01/04/2020   CHOL 191 01/03/2019   Lab Results  Component Value Date   HDL 73.10 01/19/2021   HDL 69.20 01/04/2020   HDL 72.30 01/03/2019   Lab Results  Component Value Date   LDLCALC 116 (H) 01/19/2021   LDLCALC 128 (H) 01/04/2020   LDLCALC 102 (H) 01/03/2019   Lab Results  Component Value Date   TRIG 88.0 01/19/2021   TRIG 97.0 01/04/2020   TRIG 83.0 01/03/2019   Lab Results  Component Value Date   CHOLHDL 3 01/19/2021   CHOLHDL 3 01/04/2020   CHOLHDL 3 01/03/2019   Lab Results  Component Value Date   LDLDIRECT 135.4 09/17/2013   LDLDIRECT 125.5 09/11/2012   LDLDIRECT 121.7 05/25/2011   She has been eating better, LDL is down to 116 and HDL is very good Eating more  at home, that helps The 10-year ASCVD risk score Mikey Bussing DC Brooke Bonito., et al., 2013) is: 7.4%   Values used to calculate the score:     Age: 80 years     Sex: Female     Is Non-Hispanic African American: No     Diabetic: No     Tobacco smoker: No     Systolic Blood Pressure: 224 mmHg     Is BP treated: No     HDL Cholesterol: 73.1 mg/dL     Total Cholesterol: 207 mg/dL  Brother had MI in his 58s   Lab Results  Component Value Date   CREATININE 0.81 01/19/2021   BUN 9 01/19/2021   NA 143 01/19/2021   K 4.5 01/19/2021   CL 107 01/19/2021   CO2 29 01/19/2021   Lab Results  Component Value Date   ALT 11 01/19/2021   AST 12 01/19/2021   ALKPHOS 75 01/19/2021   BILITOT 0.9 01/19/2021    Lab Results  Component Value Date   TSH 2.75 01/19/2021    Patient Active Problem List   Diagnosis Date Noted  . Hearing loss 01/27/2021  .  Post-menopausal bleeding 01/27/2021  . Vitamin B12 deficiency 01/19/2021  . Current use of proton pump inhibitor 01/18/2021  . Welcome to Medicare preventive visit 12/21/2016  . Estrogen deficiency 06/04/2015  . Vitamin D deficiency 09/23/2014  . Thoracic or lumbosacral neuritis or radiculitis, unspecified 05/21/2013  . Gynecological examination 05/25/2011  . Other screening mammogram 05/25/2011  . Encounter for general adult medical examination with abnormal findings 05/25/2011  . Osteopenia 12/27/2010  . IBS (irritable bowel syndrome) 12/25/2010  . Allergic rhinitis 12/25/2010  . Asthma 12/25/2010  . Mild hyperlipidemia 12/25/2010  . Family history of breast cancer 12/25/2010  . Family history of aortic aneurysm 12/25/2010  . GERD 11/08/2008  . BARRETT'S ESOPHAGUS 11/08/2008   Past Medical History:  Diagnosis Date  . Allergic rhinitis   . Arthritis   . Asthma    WELL CONTROLLED  . Barrett's esophagus 2005  . Complication of anesthesia   . GERD (gastroesophageal reflux disease) 11/2008  . Hemorrhoids   . Hiatal hernia    SMALL  . History of chicken pox   . IBS (irritable bowel syndrome)   . Osteopenia   . Peptic stricture of esophagus 11-2008  . PONV (postoperative nausea and vomiting)    N/V WITH GALLBLADDER  . Recurrent sinusitis    Past Surgical History:  Procedure Laterality Date  . BREAST BIOPSY Right 07/26/2017   INTRADUCTAL PAPILLOMA.   Marland Kitchen BREAST BIOPSY Left 08/27/2020   affirm bx #1-"X" clip-path pending  . BREAST BIOPSY Left 08/27/2020   affirm bx-#2-"coil: clip-path pending  . BREAST BIOPSY WITH RADIO FREQUENCY LOCALIZER Left 09/26/2020   Procedure: BREAST BIOPSY WITH RADIO FREQUENCY LOCALIZER;  Surgeon: Herbert Pun, MD;  Location: [facility] ORS;  Service: General;  Laterality: Left;  . BREAST LUMPECTOMY Right 08/2017  . BREAST LUMPECTOMY WITH NEEDLE LOCALIZATION Right 09/09/2017   Procedure: BREAST LUMPECTOMY WITH NEEDLE LOCALIZATION;  Surgeon: Leonie Green, MD;  Location: [facility] ORS;  Service: General;  Laterality: Right;  . CHOLECYSTECTOMY  2010  . TUBAL LIGATION     Social History   Tobacco Use  . Smoking status: Never Smoker  . Smokeless tobacco: Never Used  Vaping Use  . Vaping Use: Never used  Substance Use Topics  . Alcohol use: Yes    Alcohol/week: 0.0 standard drinks    Comment: RARE  .  Drug use: No   Family History  Problem Relation Age of Onset  . Aortic aneurysm Mother   . Heart disease Mother   . Heart attack Brother        early 31's  . Arthritis Brother   . Breast cancer Sister 25  . Colon cancer Neg Hx    Allergies  Allergen Reactions  . Levofloxacin Itching and Other (See Comments)    Felt like she had bugs crawling all over her.  Lavella Lemons [Benzonatate] Nausea And Vomiting    Made GERD worse   Current Outpatient Medications on File Prior to Visit  Medication Sig Dispense Refill  . cholecalciferol (VITAMIN D) 25 MCG (1000 UT) tablet Take 3,000 Units by mouth 3 (three) times a week.    . dicyclomine (BENTYL) 10 MG capsule Take 1 capsule by mouth as needed.    . fexofenadine (ALLEGRA) 180 MG tablet Take 180 mg by mouth every morning.    . Fluticasone-Salmeterol (ADVAIR DISKUS) 250-50 MCG/DOSE AEPB Inhale 1 puff into the lungs 2 (two) times daily as needed. (Patient taking differently: Inhale 1 puff into the lungs 2 (two) times daily.) 60 each 11  . levalbuterol (XOPENEX HFA) 45 MCG/ACT inhaler Inhale 1 puff into the lungs every 4 (four) hours as needed for wheezing.    Marland Kitchen omeprazole (PRILOSEC) 20 MG capsule Take 1 capsule (20 mg total) by mouth 2 (two) times daily before a meal. 60 capsule 11  . Probiotic Product (ALIGN) 4 MG CAPS Take 1 capsule by mouth daily as needed (upset stomach).    . zolpidem (AMBIEN) 5 MG tablet Take 0.5-1 tablets (2.5-5 mg total) by mouth at bedtime. 30 tablet 3   No current facility-administered medications on file prior to visit.    Review of Systems  Constitutional:  Positive for fatigue. Negative for activity change, appetite change, fever and unexpected weight change.  HENT: Negative for congestion, ear pain, rhinorrhea, sinus pressure and sore throat.   Eyes: Negative for pain, redness and visual disturbance.  Respiratory: Negative for cough, shortness of breath and wheezing.   Cardiovascular: Negative for chest pain and palpitations.  Gastrointestinal: Negative for abdominal pain, blood in stool, constipation and diarrhea.  Endocrine: Negative for polydipsia and polyuria.  Genitourinary: Negative for dysuria, frequency and urgency.  Musculoskeletal: Negative for arthralgias, back pain and myalgias.  Skin: Negative for pallor and rash.  Allergic/Immunologic: Negative for environmental allergies.  Neurological: Negative for dizziness, syncope and headaches.  Hematological: Negative for adenopathy. Does not bruise/bleed easily.  Psychiatric/Behavioral: Negative for decreased concentration and dysphoric mood. The patient is not nervous/anxious.        Objective:   Physical Exam Constitutional:      General: She is not in acute distress.    Appearance: Normal appearance. She is well-developed and normal weight. She is not ill-appearing or diaphoretic.  HENT:     Head: Normocephalic and atraumatic.     Right Ear: Tympanic membrane, ear canal and external ear normal.     Left Ear: Tympanic membrane, ear canal and external ear normal.     Nose: Nose normal. No congestion.     Mouth/Throat:     Mouth: Mucous membranes are moist.     Pharynx: Oropharynx is clear. No posterior oropharyngeal erythema.  Eyes:     General: No scleral icterus.    Extraocular Movements: Extraocular movements intact.     Conjunctiva/sclera: Conjunctivae normal.     Pupils: Pupils are equal, round, and reactive to light.  Neck:     Thyroid: No thyromegaly.     Vascular: No carotid bruit or JVD.  Cardiovascular:     Rate and Rhythm: Normal rate and regular rhythm.      Pulses: Normal pulses.     Heart sounds: Normal heart sounds. No gallop.   Pulmonary:     Effort: Pulmonary effort is normal. No respiratory distress.     Breath sounds: Normal breath sounds. No wheezing.     Comments: Good air exch Chest:     Chest wall: No tenderness.  Abdominal:     General: Bowel sounds are normal. There is no distension or abdominal bruit.     Palpations: Abdomen is soft. There is no mass.     Tenderness: There is no abdominal tenderness.     Hernia: No hernia is present.  Genitourinary:    Comments: Declined breast exam as done recently with surgeon   Musculoskeletal:        General: No tenderness. Normal range of motion.     Cervical back: Normal range of motion and neck supple. No rigidity. No muscular tenderness.     Right lower leg: No edema.     Left lower leg: No edema.     Comments: No kyphosis   Lymphadenopathy:     Cervical: No cervical adenopathy.  Skin:    General: Skin is warm and dry.     Coloration: Skin is not pale.     Findings: No erythema or rash.     Comments: Solar lentigines diffusely   Neurological:     Mental Status: She is alert. Mental status is at baseline.     Cranial Nerves: No cranial nerve deficit.     Motor: No abnormal muscle tone.     Coordination: Coordination normal.     Gait: Gait normal.     Deep Tendon Reflexes: Reflexes are normal and symmetric. Reflexes normal.  Psychiatric:        Mood and Affect: Mood normal.        Cognition and Memory: Cognition and memory normal.           Assessment & Plan:   Problem List Items Addressed This Visit      Nervous and Auditory   Hearing loss    Bilateral cerumen semi impaction Pt wants to clear this before considering audiology eval  Will use debrox or similar product and update         Musculoskeletal and Integument   Osteopenia    Due for 2 y dexa-pt plans to call for ref in nov to get at the same time as her mammogram  She will call for that order in the  fall No falls or fx  D level is low -agreed to try vit D3 2000 iu daily (instead of several times weekly)  Walking and yard work for exercise          Other   Mild hyperlipidemia    Disc goals for lipids and reasons to control them Rev last labs with pt Rev low sat fat diet in detail  LDL is down to 116 HDL into 70s Improved Enc her to keep eating better      Encounter for general adult medical examination with abnormal findings - Primary    Reviewed health habits including diet and exercise and skin cancer prevention Reviewed appropriate screening tests for age  Also reviewed health mt list, fam hx and immunization status , as well as social and  family history   See HPI Labs reviewed  amw reviewed  Will order dexa in the fall  Mammogram utd (with recent surg) Interested in shingrix if affordable pna 23 vaccine given today  Failed hearing screen- not interested in audiologist until she gets cerumen controlled  Colonoscopy due in July, she plans to schedule (Hb was 11.8) Low D and B12 with these labs-made plan for this  Also pelvic US for report of scant post menopausal spotting      Vitamin D deficiency    Level in 20s  Only took D otc several times weekly (thought it caused constipation)  Adv to inc to 2000 iu daily (if constipation consider stool softener) Disc imp to bone and overall health      Current use of proton pump inhibitor    This may add to poor abs of vit D and B12      Vitamin B12 deficiency    Lab Results  Component Value Date   VITAMINB12 94 (L) 01/19/2021   Pt very much wants to avoid injections  Started 1000 mcg B12 daily  Re check level in 2-4 wk If not improving will need inj  The ppi may hinder GI abs Disc imp of B12 to general and neurologic health      Post-menopausal bleeding    2 episodes of scant (drop) of blood w/o other symptoms This occurred around the time of her breast surgery and also her covid booster Pelvic US ordered   Further plan with result      Relevant Orders   US Pelvic Complete With Transvaginal    Other Visit Diagnoses    Need for pneumococcal vaccination       Relevant Orders   Pneumococcal polysaccharide vaccine 23-valent greater than or equal to 2yo subcutaneous/IM (Completed)

## 2021-01-27 NOTE — Assessment & Plan Note (Signed)
Reviewed health habits including diet and exercise and skin cancer prevention Reviewed appropriate screening tests for age  Also reviewed health mt list, fam hx and immunization status , as well as social and family history   See HPI Labs reviewed  amw reviewed  Will order dexa in the fall  Mammogram utd (with recent surg) Interested in shingrix if affordable pna 23 vaccine given today  Failed hearing screen- not interested in audiologist until she gets cerumen controlled  Colonoscopy due in July, she plans to schedule (Hb was 11.8) Low D and B12 with these labs-made plan for this  Also pelvic US for report of scant post menopausal spotting

## 2021-01-27 NOTE — Assessment & Plan Note (Signed)
2 episodes of scant (drop) of blood w/o other symptoms This occurred around the time of her breast surgery and also her covid booster Pelvic US ordered  Further plan with result

## 2021-01-27 NOTE — Patient Instructions (Addendum)
Let us know when you need a referral for the bone density test let us know   Try to get at least 2000 iu of vitamin D daily    Let's re check vitamin B12 level in 2-4 weeks  If not improving you will need shots   Try debrox or other wax drop over the counter for ear wax   Pneumonia shot today   If you are interested in the new shingles vaccine (Shingrix) - call your local pharmacy to check on coverage and availability  If affordable, get on a wait list at your pharmacy to get the vaccine.  The office will call you about a pelvic ultrasound

## 2021-02-02 DIAGNOSIS — H25013 Cortical age-related cataract, bilateral: Secondary | ICD-10-CM | POA: Diagnosis not present

## 2021-02-02 DIAGNOSIS — H524 Presbyopia: Secondary | ICD-10-CM | POA: Diagnosis not present

## 2021-02-02 DIAGNOSIS — H2513 Age-related nuclear cataract, bilateral: Secondary | ICD-10-CM | POA: Diagnosis not present

## 2021-02-02 DIAGNOSIS — Z83511 Family history of glaucoma: Secondary | ICD-10-CM | POA: Diagnosis not present

## 2021-02-02 DIAGNOSIS — H5203 Hypermetropia, bilateral: Secondary | ICD-10-CM | POA: Diagnosis not present

## 2021-02-16 ENCOUNTER — Other Ambulatory Visit: Payer: Self-pay

## 2021-02-16 ENCOUNTER — Other Ambulatory Visit (INDEPENDENT_AMBULATORY_CARE_PROVIDER_SITE_OTHER): Payer: Medicare HMO

## 2021-02-16 DIAGNOSIS — E538 Deficiency of other specified B group vitamins: Secondary | ICD-10-CM | POA: Diagnosis not present

## 2021-02-16 LAB — VITAMIN B12: Vitamin B-12: 449 pg/mL (ref 211–911)

## 2021-02-27 ENCOUNTER — Ambulatory Visit
Admission: RE | Admit: 2021-02-27 | Discharge: 2021-02-27 | Disposition: A | Discharge status: Home / Self Care | Payer: Medicare HMO | Source: Ambulatory Visit | Attending: Family Medicine | Admitting: Family Medicine

## 2021-02-27 ENCOUNTER — Other Ambulatory Visit: Payer: Self-pay

## 2021-02-27 DIAGNOSIS — N950 Postmenopausal bleeding: Secondary | ICD-10-CM | POA: Diagnosis not present

## 2021-02-27 DIAGNOSIS — N8500 Endometrial hyperplasia, unspecified: Secondary | ICD-10-CM | POA: Diagnosis not present

## 2021-02-27 DIAGNOSIS — Z9851 Tubal ligation status: Secondary | ICD-10-CM | POA: Diagnosis not present

## 2021-02-27 DIAGNOSIS — R9389 Abnormal findings on diagnostic imaging of other specified body structures: Secondary | ICD-10-CM | POA: Diagnosis not present

## 2021-03-03 ENCOUNTER — Telehealth: Payer: Self-pay

## 2021-03-03 ENCOUNTER — Telehealth: Payer: Self-pay | Admitting: Family Medicine

## 2021-03-03 DIAGNOSIS — N950 Postmenopausal bleeding: Secondary | ICD-10-CM

## 2021-03-03 DIAGNOSIS — R935 Abnormal findings on diagnostic imaging of other abdominal regions, including retroperitoneum: Secondary | ICD-10-CM

## 2021-03-03 NOTE — Telephone Encounter (Signed)
I reviewed the report with her re: heterogenous 7.9 mm endom lining She has not had any more bleeding  Explained need for gyn referral to discuss endometrial bx and possible tx  We do want to r/o cancer cells or pre cancer cells Even though she is not longer bleeding -still needs to be checked out and we want prevent if from getting larger   She prefers gyn within [facility] if possible [place] and female if possible  If Monday or tues of thurs- afternoon preferred  Any time on the other days   Will place ref and route to pcc

## 2021-03-03 NOTE — Telephone Encounter (Signed)
This is being completed on result notes.

## 2021-03-03 NOTE — Telephone Encounter (Signed)
Call received from [facility] with [facility] radiology with verbal U/S results. I have printed and given to [facility] to make sure addressed. Review of chart Dr. Glori Bickers has reviewed and given instructions.

## 2021-03-03 NOTE — Telephone Encounter (Signed)
-----   Message from Ophelia Shoulder, [place] sent at 03/03/2021  4:08 PM EDT ----- Patient called back and I was unable to give her the information she seeks transferred phone call to Dr. Glori Bickers.

## 2021-03-28 ENCOUNTER — Encounter: Payer: Self-pay | Admitting: Gastroenterology

## 2021-04-08 ENCOUNTER — Ambulatory Visit: Payer: Medicare HMO | Admitting: Gastroenterology

## 2021-04-08 ENCOUNTER — Encounter: Payer: Self-pay | Admitting: Gastroenterology

## 2021-04-08 VITALS — BP 120/70 | HR 68 | Ht 64.25 in | Wt 163.5 lb

## 2021-04-08 DIAGNOSIS — Z86010 Personal history of colonic polyps: Secondary | ICD-10-CM | POA: Diagnosis not present

## 2021-04-08 DIAGNOSIS — K2100 Gastro-esophageal reflux disease with esophagitis, without bleeding: Secondary | ICD-10-CM | POA: Diagnosis not present

## 2021-04-08 NOTE — Progress Notes (Signed)
    History of Present Illness: This is a 70 year old female with a history of adenomatous colon polyps and GERD with LA class B esophagitis and esophageal stricture.  EGD in April 2010 showed an esophageal stricture and a 5 mm variation in the Z-line with biopsies showing intestinal metaplasia.  Subsequent endoscopies have not confirmed the variable Z-line and Barrett's has not been noted.  Reflux symptoms are well controlled on her current regimen.  She was initially recommended to have a 5-year interval surveillance colonoscopy in July 2022 however with the guideline changes we recommended to change to a 7-year interval for surveillance colonoscopy in July 2024.  She has no colorectal complaints.  Current Medications, Allergies, Past Medical History, Past Surgical History, Family History and Social History were reviewed in [employer] record.   Physical Exam: General: Well developed, well nourished, no acute distress Head: Normocephalic and atraumatic Eyes: Sclerae anicteric, EOMI Ears: Normal auditory acuity Mouth: Not examined, mask on during Covid-19 pandemic Lungs: Clear throughout to auscultation Heart: Regular rate and rhythm; no murmurs, rubs or bruits Abdomen: Soft, non tender and non distended. No masses, hepatosplenomegaly or hernias noted. Normal Bowel sounds Rectal: Not done Musculoskeletal: Symmetrical with no gross deformities  Pulses:  Normal pulses noted Extremities: No clubbing, cyanosis, edema or deformities noted Neurological: Alert oriented x 4, grossly nonfocal Psychological:  Alert and cooperative. Normal mood and affect   Assessment and Recommendations:  GERD with LA class B esophagitis and a hiatal hernia.  Barrett's esophagus noted on biopsy and 2010 with 2 subsequent endoscopies not showing Barrett's esophagus.  No further surveillance for Barrett's.  Closely follow antireflux measures.  Continue omeprazole 20 mg twice daily. Personal  history of adenomatous colon polyps.  Recommended changing her surveillance interval to 7 years in July 2024.  Offered her the option to continue on the 5-year surveillance interval due now if she preferred.  After discussion and addressing her questions she is comfortable with a 7-year interval.

## 2021-04-08 NOTE — Patient Instructions (Signed)
Follow up as needed.   The [facility] providers would like to encourage you to use MYCHART to communicate with providers for non-urgent requests or questions.  Due to long hold times on the telephone, sending your provider a message by MYCHART may be a faster and more efficient way to get a response.  Please allow 48 business hours for a response.  Please remember that this is for non-urgent requests.   Thank you for choosing me and [facility].  Malcolm T. Stark, Jr., MD., FACG  

## 2021-04-16 ENCOUNTER — Other Ambulatory Visit: Payer: Self-pay | Admitting: Family Medicine

## 2021-04-16 NOTE — Telephone Encounter (Signed)
Name of Medication: Ambien Name of Pharmacy: [facility] Date and Quantity: 09/12/20 #30 tabs/ 3 refills Last Office Visit and Type: CPE on 01/27/21 Next Office Visit and Type: none scheduled

## 2021-04-27 NOTE — Progress Notes (Signed)
GYNECOLOGY PROGRESS NOTE  Subjective:    Patient ID: Jenny Mcfarland, female    DOB: 1951-03-16, 70 y.o.   MRN: [id number]  HPI  Patient is a 70 y.o. No obstetric history on file. female who presents for referral from [place] [facility] at [facility] for postmenopausal bleeding and previous tubal ligation. A pelvic ultrasound was done on 02/27/2021 and it indicated. She had 3 episodes in which she had vaginal spotting this was in November, and it happened again in January and then in February, she noticed it while she was wiping. She believes that the vaginal bleeding was from the Covid injections she was given (received booster in November). Since then she has not had any more bleeding. Denies h/o abnormal pap smears, last pap was in 2012.   Of note, patient also had a lumpectomy in January of her left breast. States it was benign. Previously had breast biopsy in right breast last year. Does have a family history of breast cancer in her sister.    Past Medical History:  Diagnosis Date   Allergic rhinitis    Arthritis    Asthma    WELL CONTROLLED   Barrett's esophagus [id number]   Complication of anesthesia    GERD (gastroesophageal reflux disease) 11/2008   Hemorrhoids    Hiatal hernia    SMALL   History of chicken pox    IBS (irritable bowel syndrome)    Osteopenia    Peptic stricture of esophagus 11-2008   PONV (postoperative nausea and vomiting)    N/V WITH GALLBLADDER   Recurrent sinusitis     Family History  Problem Relation Age of Onset   Aortic aneurysm Mother    Heart disease Mother    Heart attack Brother        early 31's   Arthritis Brother    Breast cancer Sister 14   Colon cancer Neg Hx     Past Surgical History:  Procedure Laterality Date   BREAST BIOPSY Right 07/26/2017   INTRADUCTAL PAPILLOMA.    BREAST BIOPSY Left 08/27/2020   affirm bx #1-"X" clip-path pending   BREAST BIOPSY Left 08/27/2020   affirm bx-#2-"coil: clip-path pending   BREAST  BIOPSY WITH RADIO FREQUENCY LOCALIZER Left 09/26/2020   Procedure: BREAST BIOPSY WITH RADIO FREQUENCY LOCALIZER;  Surgeon: Herbert Pun, MD;  Location: [facility] ORS;  Service: General;  Laterality: Left;   BREAST LUMPECTOMY Right 08/2017   BREAST LUMPECTOMY WITH NEEDLE LOCALIZATION Right 09/09/2017   Procedure: BREAST LUMPECTOMY WITH NEEDLE LOCALIZATION;  Surgeon: Leonie Green, MD;  Location: [facility] ORS;  Service: General;  Laterality: Right;   CHOLECYSTECTOMY  2010   TUBAL LIGATION      Social History   Socioeconomic History   Marital status: Single    Spouse name: Not on file   Number of children: 2   Years of education: Not on file   Highest education level: Not on file  Occupational History   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  Tobacco Use   Smoking status: Never   Smokeless tobacco: Never  Vaping Use   Vaping Use: Never used  Substance and Sexual Activity   Alcohol use: Yes    Alcohol/week: 0.0 standard drinks    Comment: RARE   Drug use: No   Sexual activity: Not Currently  Other Topics Concern   Not on file  Social History Narrative   Daily caffeine use - soft drinks/tea       Separating from husband 1/17  Social Determinants of Health   Financial Resource Strain: Low Risk    Difficulty of Paying Living Expenses: Not hard at all  Food Insecurity: No Food Insecurity   Worried About Charity fundraiser in the Last Year: Never true   [place] in the Last Year: Never true  Transportation Needs: No Transportation Needs   Lack of Transportation (Medical): No   Lack of Transportation (Non-Medical): No  Physical Activity: Sufficiently Active   Days of Exercise per Week: 7 days   Minutes of Exercise per Session: 30 min  Stress: No Stress Concern Present   Feeling of Stress : Not at all  Social Connections: Not on file  Intimate Partner Violence: Not At Risk   Fear of Current or Ex-Partner: No   Emotionally Abused: No   Physically Abused:  No   Sexually Abused: No    Current Outpatient Medications on File Prior to Visit  Medication Sig Dispense Refill   cholecalciferol (VITAMIN D) 25 MCG (1000 UT) tablet Take 3,000 Units by mouth 3 (three) times a week.     dicyclomine (BENTYL) 10 MG capsule Take 1 capsule by mouth as needed.     fexofenadine (ALLEGRA) 180 MG tablet Take 180 mg by mouth every morning.     Fluticasone-Salmeterol (ADVAIR DISKUS) 250-50 MCG/DOSE AEPB Inhale 1 puff into the lungs 2 (two) times daily as needed. (Patient taking differently: Inhale 1 puff into the lungs 2 (two) times daily.) 60 each 11   levalbuterol (XOPENEX HFA) 45 MCG/ACT inhaler Inhale 1 puff into the lungs every 4 (four) hours as needed for wheezing.     omeprazole (PRILOSEC) 20 MG capsule Take 1 capsule (20 mg total) by mouth 2 (two) times daily before a meal. 60 capsule 11   Probiotic Product (ALIGN) 4 MG CAPS Take 1 capsule by mouth daily as needed (upset stomach).     vitamin B-12 (CYANOCOBALAMIN) 1000 MCG tablet Take 1,000 mcg by mouth daily.     zolpidem (AMBIEN) 5 MG tablet TAKE 1/2 TO 1 (ONE-HALF TO ONE) TABLET BY MOUTH AT BEDTIME 30 tablet 3   No current facility-administered medications on file prior to visit.    Allergies  Allergen Reactions   Levofloxacin Itching and Other (See Comments)    Felt like she had bugs crawling all over her.   Tessalon [Benzonatate] Nausea And Vomiting    Made GERD worse     Review of Systems Pertinent items noted in HPI and remainder of comprehensive ROS otherwise negative.   Objective:   Blood pressure 126/74, pulse 61, resp. rate 16, height 5' 4.75" (1.645 m), weight 162 lb 6.4 oz (73.7 kg). Body mass index is 27.23 kg/m. General appearance: alert and cooperative Exam deferred today.    Assessment:   1. PMB (postmenopausal bleeding)   2. Endometrial thickening on ultrasound   3. Family history of breast cancer      Plan:   1. PMB (postmenopausal bleeding) Discussed etiologies of  postmenopausal bleeding, concern about precancerous/hyperplasia or cancerous etiology (5 to 10% percent of cases). Also discussed role of unopposed estrogen exposure in leading to thickened or proliferative endometrium; and its possible correlation with endometrial hyperplasia/carcinoma.  Discussed that obesity is linked to endometrial pathology given that adipose cells produce extra estrogen (estrone) which can cause the endometrium to have a significant amount of estrogen exposure.  However, she was reassured that endometrial atrophy and endometrial polyps are the most common causes of postmenopausal bleeding.  Uterine bleeding  in postmenopausal women is usually light and self-limited. Exclusion of cancer is the main objective; therefore, treatment is usually unnecessary once cancer has been excluded.  The primary goal in the diagnostic evaluation of postmenopausal women with uterine bleeding is to exclude malignancy.  Recommend endometrial biopsy for further workup as endometrial thickening noted on ultrasound. Patient would like to defer until she returns from vacation. Will scheduled procedure in 3 weeks. Info included in AVS regarding biopsy procedure.   2. Endometrial thickening on ultrasound See discussion of PMB. Recommend endometrial biopsy due to thickening.    3. Family history of breast cancer - Patient with family history of breast cancer (sister, age 55). Has recently undergone workup and lumpectomy for concerning breast findings on imaging, however has been benign thus far.    A total of 20 minutes were spent face-to-face with the patient during this encounter and over half of that time dealt with counseling and coordination of care.  Rubie Maid, MD [facility]

## 2021-04-28 ENCOUNTER — Ambulatory Visit: Payer: Medicare HMO | Admitting: Obstetrics and Gynecology

## 2021-04-28 ENCOUNTER — Encounter: Payer: Self-pay | Admitting: Obstetrics and Gynecology

## 2021-04-28 ENCOUNTER — Other Ambulatory Visit: Payer: Self-pay

## 2021-04-28 VITALS — BP 126/74 | HR 61 | Resp 16 | Ht 64.75 in | Wt 162.4 lb

## 2021-04-28 DIAGNOSIS — N950 Postmenopausal bleeding: Secondary | ICD-10-CM | POA: Diagnosis not present

## 2021-04-28 DIAGNOSIS — Z803 Family history of malignant neoplasm of breast: Secondary | ICD-10-CM

## 2021-04-28 DIAGNOSIS — R9389 Abnormal findings on diagnostic imaging of other specified body structures: Secondary | ICD-10-CM

## 2021-04-28 NOTE — Patient Instructions (Signed)
Postmenopausal Bleeding Postmenopausal bleeding is any bleeding that a woman has after she has entered menopause. Menopause is the end of a woman's fertile years. After menopause, a woman no longer ovulates and does not have menstrual periods. Therefore, she should no longer have bleeding from her vagina. Postmenopausal bleeding may have various causes, including: Menopausal hormone therapy (MHT). Endometrial atrophy. After menopause, low estrogen hormone levels cause the membrane that lines the uterus (endometrium) to become thin. You may have bleeding as the endometrium thins. Endometrial hyperplasia. This condition is caused by excess estrogen hormones and low levels of progesterone hormones. The excess estrogen causes the endometrium to thicken, which can lead to bleeding. In some cases, this can lead to cancer of the uterus. Endometrial cancer. Noncancerous growths (polyps) on the endometrium, the lining of the uterus, or the cervix. Uterine fibroids. These are noncancerous growths in or around the uterus muscle tissue that can cause heavy bleeding. Any type of postmenopausal bleeding, even if it appears to be a typical menstrual period, should be checked by your health care provider. Treatment will depend on the cause of the bleeding. Follow these instructions at home:  Pay attention to any changes in your symptoms. Let your health care provider know about them. Avoid using tampons and douches as told by your health care provider. Change your pads regularly. Get regular pelvic exams, including Pap tests, as told by your health care provider. Take iron supplements as told by your health care provider. Take over-the-counter and prescription medicines only as told by your health care provider. Keep all follow-up visits. This is important. Contact a health care provider if: You have new bleeding from the vagina after menopause. You have pain in your abdomen. Get help right away if: You have  a fever or chills. You have severe pain with bleeding. You are passing blood clots. You have heavy bleeding, need more than 1 pad an hour, and have never experienced this before. You have headaches or feel faint or dizzy. Summary Postmenopausal bleeding is any bleeding that a woman has after she has entered into menopause. Postmenopausal bleeding may have various causes. Treatment will depend on the cause of the bleeding. Any type of postmenopausal bleeding, even if it appears to be a typical menstrual period, should be checked by your health care provider. Be sure to pay attention to any changes in your symptoms and keep all follow-up visits. This information is not intended to replace advice given to you by your health care provider. Make sure you discuss any questions you have with your health care provider. Document Revised: 01/31/2020 Document Reviewed: 01/31/2020 Elsevier Patient Education  2022 Elsevier Inc.  

## 2021-05-14 DIAGNOSIS — H1045 Other chronic allergic conjunctivitis: Secondary | ICD-10-CM | POA: Diagnosis not present

## 2021-05-14 DIAGNOSIS — J4530 Mild persistent asthma, uncomplicated: Secondary | ICD-10-CM | POA: Diagnosis not present

## 2021-05-14 DIAGNOSIS — J301 Allergic rhinitis due to pollen: Secondary | ICD-10-CM | POA: Diagnosis not present

## 2021-05-14 DIAGNOSIS — J3089 Other allergic rhinitis: Secondary | ICD-10-CM | POA: Diagnosis not present

## 2021-05-19 ENCOUNTER — Other Ambulatory Visit: Payer: Self-pay | Admitting: Family Medicine

## 2021-05-19 DIAGNOSIS — Z1231 Encounter for screening mammogram for malignant neoplasm of breast: Secondary | ICD-10-CM

## 2021-05-26 NOTE — Progress Notes (Signed)
GYNECOLOGY PROGRESS NOTE  Subjective:    Patient ID: Jenny Mcfarland, female    DOB: 1951-04-09, 70 y.o.   MRN: [id number]  HPI  Patient is a 70 y.o. female who presents for endometrial biopsy. To be performed for several episodes of post-menopausal bleeding between November through February. Was also noted to have thickened endometrium on recent ultrasound.   The following portions of the patient's history were reviewed and updated as appropriate: allergies, current medications, past family history, past medical history, past social history, past surgical history, and problem list.  Review of Systems Pertinent items noted in HPI and remainder of comprehensive ROS otherwise negative.   Objective:   Blood pressure 136/82, pulse (!) 57, resp. rate 16, height 5\' 5"  (1.651 m), weight 164 lb 1.6 oz (74.4 kg).  Body mass index is 27.31 kg/m.  General appearance: alert and no distress Abdomen: soft, non-tender; bowel sounds normal; no masses,  no organomegaly Pelvic: external genitalia normal, rectovaginal septum normal.  Vagina without discharge.  Cervix normal appearing, no lesions and no motion tenderness, stenotic os.  Uterus mobile, nontender, normal shape and size.  Adnexae non-palpable, nontender bilaterally.  Extremities: extremities normal, atraumatic, no cyanosis or edema Neurologic: Grossly normal   Imaging:  US Pelvic Complete With Transvaginal CLINICAL DATA:  Postmenopausal bleeding.  Previous tubal ligation.  EXAM: TRANSABDOMINAL AND TRANSVAGINAL ULTRASOUND OF PELVIS  TECHNIQUE: Both transabdominal and transvaginal ultrasound examinations of the pelvis were performed. Transabdominal technique was performed for global imaging of the pelvis including uterus, ovaries, adnexal regions, and pelvic cul-de-sac. It was necessary to proceed with endovaginal exam following the transabdominal exam to visualize the uterus, endometrium, and adnexal regions  COMPARISON:   None  FINDINGS: Uterus  Measurements: 6.8 x 2.0 x 3.5 centimeter = volume: 24.7 mL. No fibroids or other mass visualized.  Endometrium  Thickness: 7.9 millimeters. Endometrium appears heterogeneous with locules of fluid within the canal.  Right ovary  Measurements: The ovary is not visualized, either absent or obscured.  Left ovary  Measurements: The ovary is not visualized, either absent or obscured.  Other findings  No abnormal free fluid.  IMPRESSION: 1. Heterogeneous endometrial thickening of 7.9 millimeters. In the setting of post-menopausal bleeding, endometrial sampling is indicated to exclude carcinoma. If results are benign, sonohysterogram should be considered for focal lesion work-up. (Ref: Radiological Reasoning: Algorithmic Workup of Abnormal Vaginal Bleeding with Endovaginal Sonography and Sonohysterography. AJR 2008; [id number]:Z76-73) 2. Nonvisualized ovaries.  These results will be called to the ordering clinician or representative by the Radiologist Assistant, and communication documented in the PACS or [employer].  Electronically Signed   By: Nolon Nations M.D.   On: 03/02/2021 16:29   Assessment:   1. PMB (postmenopausal bleeding)   2. Endometrial thickening on ultrasound   3. Cervical stenosis (uterine cervix)    Plan:   1. PMB (postmenopausal bleeding) - Endometrial biopsy performed today (see procedure note below).  Will notify patient of results. Briefly discussed management results depending on results of biopsy.   2. Endometrial thickening on ultrasound - Endometrial biopsy performed.  3. Cervical stenosis - Cervical dilation performed with endometrial biopsy.   Endometrial Biopsy Procedure Note  The patient is positioned on the exam table in the dorsal lithotomy position. Bimanual exam confirms uterine position and size. A Graves speculum was placed into the vagina. A single toothed tenaculum is placed onto the anterior lip  of the cervix. The cervix was noted to be stenotic, so dilation was performed.  The pipette was placed into the endocervical canal and is advanced to the uterine fundus. Using a piston like technique, with vacuum created by withdrawing the stylus, the endometrial specimen is obtained and transferred to the biopsy container. Minimal bleeding is encountered. The procedure is well tolerated.   Uterine Position: mid   Uterine Length: 7 cm   Uterine Specimen: Scant   Post procedure instructions are given. The patient is scheduled for follow up appointment.   Rubie Maid, MD [facility].

## 2021-05-27 ENCOUNTER — Other Ambulatory Visit (HOSPITAL_COMMUNITY)
Admission: RE | Admit: 2021-05-27 | Discharge: 2021-05-27 | Disposition: A | Discharge status: Home / Self Care | Payer: Medicare HMO | Source: Ambulatory Visit | Attending: Obstetrics and Gynecology | Admitting: Obstetrics and Gynecology

## 2021-05-27 ENCOUNTER — Encounter: Payer: Self-pay | Admitting: Obstetrics and Gynecology

## 2021-05-27 ENCOUNTER — Ambulatory Visit: Payer: Medicare HMO | Admitting: Obstetrics and Gynecology

## 2021-05-27 ENCOUNTER — Other Ambulatory Visit: Payer: Self-pay

## 2021-05-27 VITALS — BP 136/82 | HR 57 | Resp 16 | Ht 65.0 in | Wt 164.1 lb

## 2021-05-27 DIAGNOSIS — N950 Postmenopausal bleeding: Secondary | ICD-10-CM | POA: Insufficient documentation

## 2021-05-27 DIAGNOSIS — R9389 Abnormal findings on diagnostic imaging of other specified body structures: Secondary | ICD-10-CM | POA: Insufficient documentation

## 2021-05-27 DIAGNOSIS — N882 Stricture and stenosis of cervix uteri: Secondary | ICD-10-CM

## 2021-05-27 DIAGNOSIS — N719 Inflammatory disease of uterus, unspecified: Secondary | ICD-10-CM | POA: Diagnosis not present

## 2021-05-27 NOTE — Patient Instructions (Signed)

## 2021-05-29 LAB — SURGICAL PATHOLOGY

## 2021-06-05 ENCOUNTER — Telehealth: Payer: Self-pay | Admitting: Obstetrics and Gynecology

## 2021-06-05 DIAGNOSIS — R9389 Abnormal findings on diagnostic imaging of other specified body structures: Secondary | ICD-10-CM

## 2021-06-05 DIAGNOSIS — N950 Postmenopausal bleeding: Secondary | ICD-10-CM

## 2021-06-05 NOTE — Telephone Encounter (Signed)
Ok.  Let's have her scheduled for a follow up ultrasound in 3-6 months (likely will need to schedule at the hospital as I don't know if we will still have an ultrasonographer at that time).    Dr. Marcelline Mates

## 2021-06-05 NOTE — Telephone Encounter (Signed)
Patient received the message that was sent by Dr Marcelline Mates.  Patient stated she would like to wait 3-6 and have the [place].  She stated that she has not bled since January/February.  Patient stated that you may call.

## 2021-06-26 ENCOUNTER — Other Ambulatory Visit: Payer: Self-pay

## 2021-06-26 ENCOUNTER — Ambulatory Visit (INDEPENDENT_AMBULATORY_CARE_PROVIDER_SITE_OTHER): Payer: Medicare HMO

## 2021-06-26 DIAGNOSIS — Z23 Encounter for immunization: Secondary | ICD-10-CM

## 2021-07-13 DIAGNOSIS — K589 Irritable bowel syndrome without diarrhea: Secondary | ICD-10-CM | POA: Diagnosis not present

## 2021-07-13 DIAGNOSIS — Z791 Long term (current) use of non-steroidal anti-inflammatories (NSAID): Secondary | ICD-10-CM | POA: Diagnosis not present

## 2021-07-13 DIAGNOSIS — Z7951 Long term (current) use of inhaled steroids: Secondary | ICD-10-CM | POA: Diagnosis not present

## 2021-07-13 DIAGNOSIS — R030 Elevated blood-pressure reading, without diagnosis of hypertension: Secondary | ICD-10-CM | POA: Diagnosis not present

## 2021-07-13 DIAGNOSIS — Z8249 Family history of ischemic heart disease and other diseases of the circulatory system: Secondary | ICD-10-CM | POA: Diagnosis not present

## 2021-07-13 DIAGNOSIS — K219 Gastro-esophageal reflux disease without esophagitis: Secondary | ICD-10-CM | POA: Diagnosis not present

## 2021-07-13 DIAGNOSIS — G4700 Insomnia, unspecified: Secondary | ICD-10-CM | POA: Diagnosis not present

## 2021-07-13 DIAGNOSIS — J45909 Unspecified asthma, uncomplicated: Secondary | ICD-10-CM | POA: Diagnosis not present

## 2021-07-13 DIAGNOSIS — Z803 Family history of malignant neoplasm of breast: Secondary | ICD-10-CM | POA: Diagnosis not present

## 2021-09-11 ENCOUNTER — Other Ambulatory Visit: Payer: Self-pay

## 2021-09-11 ENCOUNTER — Ambulatory Visit
Admission: RE | Admit: 2021-09-11 | Discharge: 2021-09-11 | Disposition: A | Discharge status: Home / Self Care | Payer: Medicare HMO | Source: Ambulatory Visit | Attending: Family Medicine | Admitting: Family Medicine

## 2021-09-11 DIAGNOSIS — Z1231 Encounter for screening mammogram for malignant neoplasm of breast: Secondary | ICD-10-CM | POA: Insufficient documentation

## 2021-09-14 ENCOUNTER — Other Ambulatory Visit: Payer: Self-pay | Admitting: Family Medicine

## 2021-09-14 DIAGNOSIS — R928 Other abnormal and inconclusive findings on diagnostic imaging of breast: Secondary | ICD-10-CM

## 2021-09-14 DIAGNOSIS — N6489 Other specified disorders of breast: Secondary | ICD-10-CM

## 2021-09-25 ENCOUNTER — Other Ambulatory Visit: Payer: Self-pay

## 2021-09-25 ENCOUNTER — Ambulatory Visit
Admission: RE | Admit: 2021-09-25 | Discharge: 2021-09-25 | Disposition: A | Discharge status: Home / Self Care | Payer: Medicare HMO | Source: Ambulatory Visit | Attending: Family Medicine | Admitting: Family Medicine

## 2021-09-25 DIAGNOSIS — R928 Other abnormal and inconclusive findings on diagnostic imaging of breast: Secondary | ICD-10-CM | POA: Diagnosis not present

## 2021-09-25 DIAGNOSIS — N6489 Other specified disorders of breast: Secondary | ICD-10-CM | POA: Diagnosis not present

## 2021-09-25 DIAGNOSIS — R922 Inconclusive mammogram: Secondary | ICD-10-CM | POA: Diagnosis not present

## 2021-09-28 ENCOUNTER — Other Ambulatory Visit: Payer: Self-pay | Admitting: Family Medicine

## 2021-09-28 DIAGNOSIS — N6310 Unspecified lump in the right breast, unspecified quadrant: Secondary | ICD-10-CM

## 2021-09-28 DIAGNOSIS — R928 Other abnormal and inconclusive findings on diagnostic imaging of breast: Secondary | ICD-10-CM

## 2021-10-01 ENCOUNTER — Telehealth: Payer: Self-pay | Admitting: Family Medicine

## 2021-10-01 NOTE — Telephone Encounter (Signed)
Pt called stating that she need Pre Authorization for a MRI and a Ultra Sound Biopsy.Pt states the Ultra Sound Biopsy is on 09/12/21 and the MRI is on 09/14/21. Pt states both are at [facility]. Please advise.

## 2021-10-01 NOTE — Telephone Encounter (Signed)
Called patient and made her aware.   Auth initiated via [place] for MRI  Primary Diagnosis Code: R92.8 Description: Other abnormal and inconclusive findings on diagnostic imaging of breast Secondary Diagnosis Code:  Description:  Date of Service: Not provided   CPT Code: 77049 Description: MRI BREAST BILAT wwo dye wCAD Case Number: [id number] Review Date: 10/01/2021 3:10:59 PM Expiration Date: N/A Status: Your case has been sent to clinical review. You will be notified via fax within 2 business days if additional clinical information is needed. If you wish to speak with [employer] at anytime, please call [phone].

## 2021-10-05 DIAGNOSIS — L239 Allergic contact dermatitis, unspecified cause: Secondary | ICD-10-CM | POA: Diagnosis not present

## 2021-10-06 ENCOUNTER — Ambulatory Visit: Payer: Medicare HMO

## 2021-10-08 NOTE — Telephone Encounter (Signed)
[place], denial letter is in your fax folder (S:Drive)

## 2021-10-08 NOTE — Telephone Encounter (Signed)
There should have been something been something faxed to Dr Glori Bickers - they need Dr Glori Bickers to call and do a Peer-to-Peer before 10/11/21 or they will deny this claim. They are requesting Bx results that have not been done yet.  See below  [employer]  Urgent Action Needed: Information needed in order to approve request for member  [facility]. Respond Before 10/11/2021. This notice is not a denial of coverage notice. This notice is an opportunity to provide  additional information, not previously provided, or to engage in a Peer-to-Peer discussion,  prior to issuing a denial decision. Date: 10/07/2021 Member number: [id number]  Please provide the requested clinical information Before 10/11/2021. Please call [phone],  select the prompt associated with the request type and enter in reference number [id number] to  speak with a clinician about this request. You may fax additional clinical information to support  this request to [phone] for review.

## 2021-10-08 NOTE — Telephone Encounter (Signed)
Letter printed and placed in PCP's inbox for review

## 2021-10-09 ENCOUNTER — Telehealth: Payer: Self-pay | Admitting: Family Medicine

## 2021-10-09 NOTE — Telephone Encounter (Signed)
Noted  

## 2021-10-09 NOTE — Telephone Encounter (Signed)
Was able to get the MRI approved with a peer to peer   Here is the number:  [phone]

## 2021-10-09 NOTE — Telephone Encounter (Signed)
Authorization information:  Tower, [name], MD 18 minutes ago (4:40 PM)   Was able to get the MRI approved with a peer to peer    Here is the number:   [id number]

## 2021-10-09 NOTE — Telephone Encounter (Signed)
I have an appt for a peer to peer on the phone at 4:30- waiting for the call now  Thanks

## 2021-10-09 NOTE — Telephone Encounter (Signed)
Pt is good to go for 10/15/21 appt Approval noted on MRI appt in Epic  Nothing further needed.

## 2021-10-09 NOTE — Telephone Encounter (Signed)
IS there an update on this ?  The deadline is 10/11/21 and they will deny the claim and then it will have to go through the Appeals Process.

## 2021-10-13 ENCOUNTER — Other Ambulatory Visit: Payer: Self-pay

## 2021-10-13 ENCOUNTER — Ambulatory Visit
Admission: RE | Admit: 2021-10-13 | Discharge: 2021-10-13 | Disposition: A | Discharge status: Home / Self Care | Payer: Medicare HMO | Source: Ambulatory Visit | Attending: Family Medicine | Admitting: Family Medicine

## 2021-10-13 DIAGNOSIS — N6310 Unspecified lump in the right breast, unspecified quadrant: Secondary | ICD-10-CM | POA: Insufficient documentation

## 2021-10-13 DIAGNOSIS — R928 Other abnormal and inconclusive findings on diagnostic imaging of breast: Secondary | ICD-10-CM

## 2021-10-13 DIAGNOSIS — N6311 Unspecified lump in the right breast, upper outer quadrant: Secondary | ICD-10-CM | POA: Diagnosis not present

## 2021-10-13 HISTORY — PX: BREAST BIOPSY: SHX20

## 2021-10-15 ENCOUNTER — Ambulatory Visit
Admission: RE | Admit: 2021-10-15 | Discharge: 2021-10-15 | Disposition: A | Discharge status: Home / Self Care | Payer: Medicare HMO | Source: Ambulatory Visit | Attending: Family Medicine | Admitting: Family Medicine

## 2021-10-15 ENCOUNTER — Other Ambulatory Visit: Payer: Self-pay

## 2021-10-15 DIAGNOSIS — R928 Other abnormal and inconclusive findings on diagnostic imaging of breast: Secondary | ICD-10-CM | POA: Insufficient documentation

## 2021-10-15 DIAGNOSIS — Z803 Family history of malignant neoplasm of breast: Secondary | ICD-10-CM | POA: Diagnosis not present

## 2021-10-15 DIAGNOSIS — N6311 Unspecified lump in the right breast, upper outer quadrant: Secondary | ICD-10-CM | POA: Diagnosis not present

## 2021-10-15 LAB — SURGICAL PATHOLOGY

## 2021-10-15 MED ORDER — GADOBUTROL 1 MMOL/ML IV SOLN
7.0000 mL | Freq: Once | INTRAVENOUS | Status: AC | PRN
  Administered 2021-10-15: 7 mL via INTRAVENOUS

## 2021-10-16 ENCOUNTER — Encounter: Payer: Self-pay | Admitting: *Deleted

## 2021-10-16 NOTE — Progress Notes (Signed)
Received message from Jenny Sniff, RN, from [facility] that patient has been informed of her benign breast biopsy and need for surgical consultation.  States patient is ready for navigation services.  Called patient.  No answer and no voicemail.  Will try again later.

## 2021-10-19 ENCOUNTER — Encounter: Payer: Self-pay | Admitting: *Deleted

## 2021-10-19 NOTE — Progress Notes (Signed)
Tried to call patient to initiate navigation services.  Left message on her cell and home phone to call me back.

## 2021-10-21 ENCOUNTER — Encounter: Payer: Self-pay | Admitting: *Deleted

## 2021-10-21 NOTE — Progress Notes (Signed)
I have scheduled patient to see Dr. Bary Castilla per her request on 11/05/21 @ 1:45.  I left her a message with her appointment date and time.

## 2021-10-29 ENCOUNTER — Other Ambulatory Visit: Payer: Self-pay

## 2021-10-29 ENCOUNTER — Ambulatory Visit
Admission: RE | Admit: 2021-10-29 | Discharge: 2021-10-29 | Disposition: A | Discharge status: Home / Self Care | Payer: Medicare HMO | Source: Ambulatory Visit | Attending: Family Medicine | Admitting: Family Medicine

## 2021-10-29 DIAGNOSIS — R928 Other abnormal and inconclusive findings on diagnostic imaging of breast: Secondary | ICD-10-CM | POA: Insufficient documentation

## 2021-10-29 DIAGNOSIS — N6031 Fibrosclerosis of right breast: Secondary | ICD-10-CM | POA: Diagnosis not present

## 2021-10-29 HISTORY — PX: BREAST BIOPSY: SHX20

## 2021-10-30 LAB — SURGICAL PATHOLOGY

## 2021-11-02 ENCOUNTER — Ambulatory Visit: Payer: Medicare HMO

## 2021-11-05 DIAGNOSIS — N6099 Unspecified benign mammary dysplasia of unspecified breast: Secondary | ICD-10-CM | POA: Diagnosis not present

## 2021-11-05 DIAGNOSIS — M85859 Other specified disorders of bone density and structure, unspecified thigh: Secondary | ICD-10-CM | POA: Diagnosis not present

## 2021-11-06 ENCOUNTER — Telehealth: Payer: Self-pay

## 2021-11-06 NOTE — Telephone Encounter (Signed)
Patient called wanted to see fi you would accept her daughter as new patient.she was seen at [facility] but provider is leaving.  ? ?Jenny Mcfarland dob [birth year]   ?Phone [phone] ?

## 2021-11-07 NOTE — Telephone Encounter (Signed)
Not taking new patients at the moment  

## 2021-11-09 NOTE — Telephone Encounter (Signed)
Called patient and reviewed information. She had no further questions.  ?

## 2021-12-01 ENCOUNTER — Other Ambulatory Visit: Payer: Self-pay | Admitting: Family Medicine

## 2021-12-01 NOTE — Telephone Encounter (Signed)
Name of Medication: Ambien ?Name of Pharmacy: [facility] ?Last Fill or Written Date and Quantity: 04/16/21 #30 tabs/ 3 refills ?Last Office Visit and Type: CPE on 01/27/21 ?Next Office Visit and Type: CPE on 01/29/22  ?

## 2022-01-21 ENCOUNTER — Telehealth: Payer: Self-pay | Admitting: Family Medicine

## 2022-01-21 ENCOUNTER — Ambulatory Visit (INDEPENDENT_AMBULATORY_CARE_PROVIDER_SITE_OTHER): Payer: Medicare HMO

## 2022-01-21 VITALS — Wt 164.0 lb

## 2022-01-21 DIAGNOSIS — Z0000 Encounter for general adult medical examination without abnormal findings: Secondary | ICD-10-CM | POA: Insufficient documentation

## 2022-01-21 DIAGNOSIS — E559 Vitamin D deficiency, unspecified: Secondary | ICD-10-CM

## 2022-01-21 DIAGNOSIS — E538 Deficiency of other specified B group vitamins: Secondary | ICD-10-CM

## 2022-01-21 DIAGNOSIS — E785 Hyperlipidemia, unspecified: Secondary | ICD-10-CM

## 2022-01-21 NOTE — Progress Notes (Signed)
Virtual Visit via Telephone Note  I connected with  Jenny Mcfarland on 01/21/22 at 11:15 AM EDT by telephone and verified that I am speaking with the correct person using two identifiers.  Location: Patient: home Provider: [place] Persons participating in the virtual visit: [place]   I discussed the limitations, risks, security and privacy concerns of performing an evaluation and management service by telephone and the availability of in person appointments. The patient expressed understanding and agreed to proceed.  Interactive audio and video telecommunications were attempted between this nurse and patient, however failed, due to patient having technical difficulties OR patient did not have access to video capability.  We continued and completed visit with audio only.  Some vital signs may be absent or patient reported.   Dionisio David, LPN  Subjective:   Jenny Mcfarland is a 71 y.o. female who presents for Medicare Annual (Subsequent) preventive examination.  Review of Systems           Objective:    There were no vitals filed for this visit. There is no height or weight on file to calculate BMI.     01/20/2021   11:12 AM 09/26/2020    8:52 AM 09/19/2020   12:13 PM 01/06/2020    9:18 AM 01/04/2020    2:42 PM 01/01/2019   10:26 AM 12/22/2017   10:56 AM  Advanced Directives  Does Patient Have a Medical Advance Directive? Yes Yes Yes Yes Yes Yes Yes  Type of Paramedic of [place];Living will Healthcare Power of [place];Living will [place];Living will [place];Living will Living will;Healthcare Power of Attorney  Does patient want to make changes to medical advance directive?  No - Patient declined  No - Patient declined     Copy of [place] in Chart? Yes - validated most recent copy scanned in chart (See row information) No -  copy requested  Yes - validated most recent copy scanned in chart (See row information) Yes - validated most recent copy scanned in chart (See row information) No - copy requested No - copy requested    Current Medications (verified) Outpatient Encounter Medications as of 01/21/2022  Medication Sig   cholecalciferol (VITAMIN D) 25 MCG (1000 UT) tablet Take 3,000 Units by mouth 3 (three) times a week.   dicyclomine (BENTYL) 10 MG capsule Take 1 capsule by mouth as needed.   fexofenadine (ALLEGRA) 180 MG tablet Take 180 mg by mouth every morning.   Fluticasone-Salmeterol (ADVAIR DISKUS) 250-50 MCG/DOSE AEPB Inhale 1 puff into the lungs 2 (two) times daily as needed. (Patient taking differently: Inhale 1 puff into the lungs 2 (two) times daily.)   levalbuterol (XOPENEX HFA) 45 MCG/ACT inhaler Inhale 1 puff into the lungs every 4 (four) hours as needed for wheezing.   omeprazole (PRILOSEC) 20 MG capsule Take 1 capsule (20 mg total) by mouth 2 (two) times daily before a meal.   Probiotic Product (ALIGN) 4 MG CAPS Take 1 capsule by mouth daily as needed (upset stomach).   vitamin B-12 (CYANOCOBALAMIN) 1000 MCG tablet Take 1,000 mcg by mouth daily.   zolpidem (AMBIEN) 5 MG tablet TAKE 1/2 TO 1 (ONE-HALF TO ONE) TABLET BY MOUTH AT BEDTIME   No facility-administered encounter medications on file as of 01/21/2022.    Allergies (verified) Levofloxacin and Tessalon [benzonatate]   History: Past Medical History:  Diagnosis Date   Allergic rhinitis  Arthritis    Asthma    WELL CONTROLLED   Barrett's esophagus 2671   Complication of anesthesia    GERD (gastroesophageal reflux disease) 11/2008   Hemorrhoids    Hiatal hernia    SMALL   History of chicken pox    IBS (irritable bowel syndrome)    Osteopenia    Peptic stricture of esophagus 11-2008   PONV (postoperative nausea and vomiting)    N/V WITH GALLBLADDER   Recurrent sinusitis    Past Surgical History:  Procedure Laterality Date    BREAST BIOPSY Right 07/26/2017   INTRADUCTAL PAPILLOMA.    BREAST BIOPSY Left 08/27/2020   affirm bx #1-"X" clip-path pending   BREAST BIOPSY Left 08/27/2020   affirm bx-#2-"coil: clip-path pending   BREAST BIOPSY Right 10/13/2021   [place] bx, heart marker, path pending   BREAST BIOPSY Right 10/29/2021   right stereo bx X clip -path pending   BREAST BIOPSY WITH RADIO FREQUENCY LOCALIZER Left 09/26/2020   Procedure: BREAST BIOPSY WITH RADIO FREQUENCY LOCALIZER;  Surgeon: Herbert Pun, MD;  Location: [facility] ORS;  Service: General;  Laterality: Left;   BREAST LUMPECTOMY Right 08/2017   BREAST LUMPECTOMY WITH NEEDLE LOCALIZATION Right 09/09/2017   Procedure: BREAST LUMPECTOMY WITH NEEDLE LOCALIZATION;  Surgeon: Leonie Green, MD;  Location: [facility] ORS;  Service: General;  Laterality: Right;   CHOLECYSTECTOMY  2010   TUBAL LIGATION     Family History  Problem Relation Age of Onset   Aortic aneurysm Mother    Heart disease Mother    Heart attack Brother        early 58's   Arthritis Brother    Breast cancer Sister 49   Colon cancer Neg Hx    Social History   Socioeconomic History   Marital status: Single    Spouse name: Not on file   Number of children: 2   Years of education: Not on file   Highest education level: Not on file  Occupational History   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  Tobacco Use   Smoking status: Never   Smokeless tobacco: Never  Vaping Use   Vaping Use: Never used  Substance and Sexual Activity   Alcohol use: Yes    Alcohol/week: 0.0 standard drinks    Comment: RARE   Drug use: No   Sexual activity: Not Currently  Other Topics Concern   Not on file  Social History Narrative   Daily caffeine use - soft drinks/tea       Separating from husband 1/17   Social Determinants of Health   Financial Resource Strain: Low Risk    Difficulty of Paying Living Expenses: Not hard at all  Food Insecurity: No Food Insecurity   Worried About  Charity fundraiser in the Last Year: Never true   Ran Out of Food in the Last Year: Never true  Transportation Needs: No Transportation Needs   Lack of Transportation (Medical): No   Lack of Transportation (Non-Medical): No  Physical Activity: Sufficiently Active   Days of Exercise per Week: 7 days   Minutes of Exercise per Session: 30 min  Stress: No Stress Concern Present   Feeling of Stress : Not at all  Social Connections: Not on file    Tobacco Counseling Counseling given: Not Answered   Clinical Intake:  Pre-visit preparation completed: Yes  Pain : No/denies pain     Nutritional Risks: None Diabetes: No  How often do you need to have someone help you when  you read instructions, pamphlets, or other written materials from your doctor or pharmacy?: 1 - Never  Diabetic?no  Interpreter Needed?: No  Information entered by :: Kirke Shaggy, LPN   Activities of Daily Living     View : No data to display.          Patient Care Team: Tower, [name], MD as PCP - General (Family Medicine) Ladene Artist, MD as Consulting Physician (Gastroenterology)  Indicate any recent Medical Services you may have received from other than [facility] providers in the past year (date may be approximate).     Assessment:   This is a routine wellness examination for Wyndi.  Hearing/Vision screen No results found.  Dietary issues and exercise activities discussed:     Goals Addressed   None    Depression Screen    05/27/2021    2:44 PM 01/20/2021   11:14 AM 01/04/2020    2:43 PM 01/01/2019   10:25 AM 12/22/2017   10:14 AM 06/17/2017   12:09 PM 12/21/2016   10:37 AM  PHQ 2/9 Scores  PHQ - 2 Score 0 0 0 0 0 0 0  PHQ- 9 Score  0 0 0 0      Fall Risk    05/27/2021    2:44 PM 01/20/2021   11:14 AM 01/04/2020    2:42 PM 01/01/2019   10:25 AM 12/22/2017   10:14 AM  [place] in the past year? 0 0 0 0 No  Number falls in past yr: 0 0 0    Injury with Fall? 0 0 0     Risk for fall due to :  No Fall Risks No Fall Risks    Follow up  Falls evaluation completed;Falls prevention discussed Falls evaluation completed;Falls prevention discussed      FALL RISK PREVENTION PERTAINING TO THE HOME:  Any stairs in or around the home? Yes  If so, are there any without handrails? No  Home free of loose throw rugs in walkways, pet beds, electrical cords, etc? Yes  Adequate lighting in your home to reduce risk of falls? Yes   ASSISTIVE DEVICES UTILIZED TO PREVENT FALLS:  Life alert? No  Use of a cane, walker or w/c? No  Grab bars in the bathroom? Yes  Shower chair or bench in shower? No  Elevated toilet seat or a handicapped toilet? No     Cognitive Function: 0 points, 6CIT             01/20/2021   11:17 AM 01/04/2020    2:45 PM 01/01/2019   10:26 AM 12/22/2017   10:15 AM  MMSE - Mini Mental State Exam  Orientation to time '5 5 5 5  '$ Orientation to Place '5 5 5 5  '$ Registration '3 3 3 3  '$ Attention/ Calculation 5 5 0 0  Recall '3 3 3 3  '$ Language- name 2 objects   0 0  Language- repeat '1 1 1 1  '$ Language- follow 3 step command   0 3  Language- read & follow direction   0 0  Write a sentence   0 0  Copy design   0 0  Total score   17 20        Immunizations Immunization History  Administered Date(s) Administered   Influenza Split 06/21/2012   Influenza,inj,Quad PF,6+ Mos 06/13/2013, 07/01/2014, 06/16/2015, 06/25/2016, 06/17/2017, 06/29/2018, 06/19/2019, 05/21/2020, 06/26/2021   PFIZER(Purple Top)SARS-COV-2 Vaccination 10/10/2019, 10/31/2019, 08/03/2020   Pneumococcal Conjugate-13 12/21/2016  Pneumococcal Polysaccharide-23 01/27/2021   Tdap 05/25/2011    TDAP status: Due, Education has been provided regarding the importance of this vaccine. Advised may receive this vaccine at local pharmacy or Health Dept. Aware to provide a copy of the vaccination record if obtained from local pharmacy or Health Dept. Verbalized acceptance and  understanding.  Flu Vaccine status: Up to date  Pneumococcal vaccine status: Up to date  Covid-19 vaccine status: Completed vaccines  Qualifies for Shingles Vaccine? Yes   Zostavax completed No   Shingrix Completed?: No.    Education has been provided regarding the importance of this vaccine. Patient has been advised to call insurance company to determine out of pocket expense if they have not yet received this vaccine. Advised may also receive vaccine at local pharmacy or Health Dept. Verbalized acceptance and understanding.  Screening Tests Health Maintenance  Topic Date Due   Hepatitis C Screening  Never done   Zoster Vaccines- Shingrix (1 of 2) Never done   DEXA SCAN  07/17/2020   COVID-19 Vaccine (4 - Booster for Pfizer series) 09/28/2020   COLONOSCOPY (Pts 45-57yr Insurance coverage will need to be confirmed)  03/12/2021   TETANUS/TDAP  05/24/2021   INFLUENZA VACCINE  03/30/2022   MAMMOGRAM  09/11/2022   Pneumonia Vaccine 70 Years old  Completed   HPV VACCINES  Aged Out    Health Maintenance  Health Maintenance Due  Topic Date Due   Hepatitis C Screening  Never done   Zoster Vaccines- Shingrix (1 of 2) Never done   DEXA SCAN  07/17/2020   COVID-19 Vaccine (4 - Booster for PProsserseries) 09/28/2020   COLONOSCOPY (Pts 45-454yrInsurance coverage will need to be confirmed)  03/12/2021   TETANUS/TDAP  05/24/2021    Colorectal cancer screening: Type of screening: Colonoscopy. Completed 03/12/16. Repeat every 7 years  Mammogram status: Completed 10/29/21. Repeat every year  Bone Density status: Completed 07/17/18. Results reflect: Bone density results: NORMAL. Repeat every 5 years.  Lung Cancer Screening: (Low Dose CT Chest recommended if Age 70-80ears, 30 pack-year currently smoking OR have quit w/in 15years.) does not qualify.   Additional Screening:  Hepatitis C Screening: does qualify; Completed no  Vision Screening: Recommended annual ophthalmology exams for  early detection of glaucoma and other disorders of the eye. Is the patient up to date with their annual eye exam?  Yes  Who is the provider or what is the name of the office in which the patient attends annual eye exams? [facility], would they like to be referred to a provider to establish care? No .   Dental Screening: Recommended annual dental exams for proper oral hygiene  Community Resource Referral / Chronic Care Management: CRR required this visit?  No   CCM required this visit?  No      Plan:     I have personally reviewed and noted the following in the patient's chart:   Medical and social history Use of alcohol, tobacco or illicit drugs  Current medications and supplements including opioid prescriptions.  Functional ability and status Nutritional status Physical activity Advanced directives List of other physicians Hospitalizations, surgeries, and ER visits in previous 12 months Vitals Screenings to include cognitive, depression, and falls Referrals and appointments  In addition, I have reviewed and discussed with patient certain preventive protocols, quality metrics, and best practice recommendations. A written personalized care plan for preventive services as well as general preventive health recommendations  were provided to patient.     Dionisio David, LPN   [id number]/[id number]   Nurse Notes: none

## 2022-01-21 NOTE — Telephone Encounter (Signed)
-----   Message from Velna Hatchet, RT sent at 01/04/2022  4:37 PM EDT ----- Regarding: Lab Fri 01/22/22 Patient is scheduled for cpx, please order future labs.  Thanks, Anda Kraft

## 2022-01-21 NOTE — Patient Instructions (Signed)
Jenny Mcfarland , Thank you for taking time to come for your Medicare Wellness Visit. I appreciate your ongoing commitment to your health goals. Please review the following plan we discussed and let me know if I can assist you in the future.   Screening recommendations/referrals: Colonoscopy: 03/12/16 Mammogram: 10/29/21 Bone Density: 07/17/18 Recommended yearly ophthalmology/optometry visit for glaucoma screening and checkup Recommended yearly dental visit for hygiene and checkup  Vaccinations: Influenza vaccine: 06/26/21 Pneumococcal vaccine: 01/27/21 Tdap vaccine: 05/25/11 Shingles vaccine: n/d   Covid-19:10/10/19, 10/31/19, 08/03/20  Advanced directives: yes  Conditions/risks identified: none  Next appointment: Follow up in one year for your annual wellness visit 01/25/23 @ 10:15 am by phone   Preventive Care 65 Years and Older, Female Preventive care refers to lifestyle choices and visits with your health care provider that can promote health and wellness. What does preventive care include? A yearly physical exam. This is also called an annual well check. Dental exams once or twice a year. Routine eye exams. Ask your health care provider how often you should have your eyes checked. Personal lifestyle choices, including: Daily care of your teeth and gums. Regular physical activity. Eating a healthy diet. Avoiding tobacco and drug use. Limiting alcohol use. Practicing safe sex. Taking low-dose aspirin every day. Taking vitamin and mineral supplements as recommended by your health care provider. What happens during an annual well check? The services and screenings done by your health care provider during your annual well check will depend on your age, overall health, lifestyle risk factors, and family history of disease. Counseling  Your health care provider may ask you questions about your: Alcohol use. Tobacco use. Drug use. Emotional well-being. Home and relationship  well-being. Sexual activity. Eating habits. History of falls. Memory and ability to understand (cognition). Work and work Statistician. Reproductive health. Screening  You may have the following tests or measurements: Height, weight, and BMI. Blood pressure. Lipid and cholesterol levels. These may be checked every 5 years, or more frequently if you are over 43 years old. Skin check. Lung cancer screening. You may have this screening every year starting at age 23 if you have a 30-pack-year history of smoking and currently smoke or have quit within the past 15 years. Fecal occult blood test (FOBT) of the stool. You may have this test every year starting at age 77. Flexible sigmoidoscopy or colonoscopy. You may have a sigmoidoscopy every 5 years or a colonoscopy every 10 years starting at age 35. Hepatitis C blood test. Hepatitis B blood test. Sexually transmitted disease (STD) testing. Diabetes screening. This is done by checking your blood sugar (glucose) after you have not eaten for a while (fasting). You may have this done every 1-3 years. Bone density scan. This is done to screen for osteoporosis. You may have this done starting at age 31. Mammogram. This may be done every 1-2 years. Talk to your health care provider about how often you should have regular mammograms. Talk with your health care provider about your test results, treatment options, and if necessary, the need for more tests. Vaccines  Your health care provider may recommend certain vaccines, such as: Influenza vaccine. This is recommended every year. Tetanus, diphtheria, and acellular pertussis (Tdap, Td) vaccine. You may need a Td booster every 10 years. Zoster vaccine. You may need this after age 94. Pneumococcal 13-valent conjugate (PCV13) vaccine. One dose is recommended after age 16. Pneumococcal polysaccharide (PPSV23) vaccine. One dose is recommended after age 92. Talk to your health care  provider about which  screenings and vaccines you need and how often you need them. This information is not intended to replace advice given to you by your health care provider. Make sure you discuss any questions you have with your health care provider. Document Released: 09/12/2015 Document Revised: 05/05/2016 Document Reviewed: 06/17/2015 Elsevier Interactive Patient Education  2017 [place] Prevention in the Home Falls can cause injuries. They can happen to people of all ages. There are many things you can do to make your home safe and to help prevent falls. What can I do on the outside of my home? Regularly fix the edges of walkways and driveways and fix any cracks. Remove anything that might make you trip as you walk through a door, such as a raised step or threshold. Trim any bushes or trees on the path to your home. Use bright outdoor lighting. Clear any walking paths of anything that might make someone trip, such as rocks or tools. Regularly check to see if handrails are loose or broken. Make sure that both sides of any steps have handrails. Any raised decks and porches should have guardrails on the edges. Have any leaves, snow, or ice cleared regularly. Use sand or salt on walking paths during winter. Clean up any spills in your garage right away. This includes oil or grease spills. What can I do in the bathroom? Use night lights. Install grab bars by the toilet and in the tub and shower. Do not use towel bars as grab bars. Use non-skid mats or decals in the tub or shower. If you need to sit down in the shower, use a plastic, non-slip stool. Keep the floor dry. Clean up any water that spills on the floor as soon as it happens. Remove soap buildup in the tub or shower regularly. Attach bath mats securely with double-sided non-slip rug tape. Do not have throw rugs and other things on the floor that can make you trip. What can I do in the bedroom? Use night lights. Make sure that you have a  light by your bed that is easy to reach. Do not use any sheets or blankets that are too big for your bed. They should not hang down onto the floor. Have a firm chair that has side arms. You can use this for support while you get dressed. Do not have throw rugs and other things on the floor that can make you trip. What can I do in the kitchen? Clean up any spills right away. Avoid walking on wet floors. Keep items that you use a lot in easy-to-reach places. If you need to reach something above you, use a strong step stool that has a grab bar. Keep electrical cords out of the way. Do not use floor polish or wax that makes floors slippery. If you must use wax, use non-skid floor wax. Do not have throw rugs and other things on the floor that can make you trip. What can I do with my stairs? Do not leave any items on the stairs. Make sure that there are handrails on both sides of the stairs and use them. Fix handrails that are broken or loose. Make sure that handrails are as long as the stairways. Check any carpeting to make sure that it is firmly attached to the stairs. Fix any carpet that is loose or worn. Avoid having throw rugs at the top or bottom of the stairs. If you do have throw rugs, attach them to the  floor with carpet tape. Make sure that you have a light switch at the top of the stairs and the bottom of the stairs. If you do not have them, ask someone to add them for you. What else can I do to help prevent falls? Wear shoes that: Do not have high heels. Have rubber bottoms. Are comfortable and fit you well. Are closed at the toe. Do not wear sandals. If you use a stepladder: Make sure that it is fully opened. Do not climb a closed stepladder. Make sure that both sides of the stepladder are locked into place. Ask someone to hold it for you, if possible. Clearly mark and make sure that you can see: Any grab bars or handrails. First and last steps. Where the edge of each step  is. Use tools that help you move around (mobility aids) if they are needed. These include: Canes. Walkers. Scooters. Crutches. Turn on the lights when you go into a dark area. Replace any light bulbs as soon as they burn out. Set up your furniture so you have a clear path. Avoid moving your furniture around. If any of your floors are uneven, fix them. If there are any pets around you, be aware of where they are. Review your medicines with your doctor. Some medicines can make you feel dizzy. This can increase your chance of falling. Ask your doctor what other things that you can do to help prevent falls. This information is not intended to replace advice given to you by your health care provider. Make sure you discuss any questions you have with your health care provider. Document Released: 06/12/2009 Document Revised: 01/22/2016 Document Reviewed: 09/20/2014 Elsevier Interactive Patient Education  2017 [employer].

## 2022-01-22 ENCOUNTER — Other Ambulatory Visit (INDEPENDENT_AMBULATORY_CARE_PROVIDER_SITE_OTHER): Payer: Medicare HMO

## 2022-01-22 ENCOUNTER — Other Ambulatory Visit: Payer: Medicare HMO

## 2022-01-22 DIAGNOSIS — E785 Hyperlipidemia, unspecified: Secondary | ICD-10-CM | POA: Diagnosis not present

## 2022-01-22 DIAGNOSIS — E538 Deficiency of other specified B group vitamins: Secondary | ICD-10-CM

## 2022-01-22 DIAGNOSIS — E559 Vitamin D deficiency, unspecified: Secondary | ICD-10-CM

## 2022-01-22 DIAGNOSIS — Z0000 Encounter for general adult medical examination without abnormal findings: Secondary | ICD-10-CM

## 2022-01-22 LAB — COMPREHENSIVE METABOLIC PANEL
ALT: 8 U/L (ref 0–35)
AST: 11 U/L (ref 0–37)
Albumin: 4 g/dL (ref 3.5–5.2)
Alkaline Phosphatase: 69 U/L (ref 39–117)
BUN: 8 mg/dL (ref 6–23)
CO2: 30 mEq/L (ref 19–32)
Calcium: 9.4 mg/dL (ref 8.4–10.5)
Chloride: 106 mEq/L (ref 96–112)
Creatinine, Ser: 0.76 mg/dL (ref 0.40–1.20)
GFR: 79.49 mL/min (ref >60.00)
Glucose, Bld: 88 mg/dL (ref 70–99)
Potassium: 4.2 mEq/L (ref 3.5–5.1)
Sodium: 143 mEq/L (ref 135–145)
Total Bilirubin: 1 mg/dL (ref 0.2–1.2)
Total Protein: 6.5 g/dL (ref 6.0–8.3)

## 2022-01-22 LAB — CBC WITH DIFFERENTIAL/PLATELET
Basophils Absolute: 0 10*3/uL (ref 0.0–0.1)
Basophils Relative: 0.6 % (ref 0.0–3.0)
Eosinophils Absolute: 0.9 10*3/uL — ABNORMAL HIGH (ref 0.0–0.7)
Eosinophils Relative: 15 % — ABNORMAL HIGH (ref 0.0–5.0)
HCT: 37.1 % (ref 36.0–46.0)
Hemoglobin: 12 g/dL (ref 12.0–15.0)
Lymphocytes Relative: 30.8 % (ref 12.0–46.0)
Lymphs Abs: 1.8 10*3/uL (ref 0.7–4.0)
MCHC: 32.2 g/dL (ref 30.0–36.0)
MCV: 84.6 fl (ref 78.0–100.0)
Monocytes Absolute: 0.3 10*3/uL (ref 0.1–1.0)
Monocytes Relative: 6 % (ref 3.0–12.0)
Neutro Abs: 2.8 10*3/uL (ref 1.4–7.7)
Neutrophils Relative %: 47.6 % (ref 43.0–77.0)
Platelets: 223 10*3/uL (ref 150.0–400.0)
RBC: 4.39 Mil/uL (ref 3.87–5.11)
RDW: 13.9 % (ref 11.5–15.5)
WBC: 5.8 10*3/uL (ref 4.0–10.5)

## 2022-01-22 LAB — VITAMIN D 25 HYDROXY (VIT D DEFICIENCY, FRACTURES): VITD: 31.87 ng/mL (ref 30.00–100.00)

## 2022-01-22 LAB — LIPID PANEL
Cholesterol: 199 mg/dL (ref 0–200)
HDL: 72.7 mg/dL (ref >39.00)
LDL Cholesterol: 109 mg/dL — ABNORMAL HIGH (ref 0–99)
NonHDL: 126.52
Total CHOL/HDL Ratio: 3
Triglycerides: 88 mg/dL (ref 0.0–149.0)
VLDL: 17.6 mg/dL (ref 0.0–40.0)

## 2022-01-22 LAB — VITAMIN B12: Vitamin B-12: 732 pg/mL (ref 211–911)

## 2022-01-22 LAB — TSH: TSH: 2.06 u[IU]/mL (ref 0.35–5.50)

## 2022-01-29 ENCOUNTER — Encounter: Payer: Medicare HMO | Admitting: Family Medicine

## 2022-02-01 ENCOUNTER — Encounter: Payer: Self-pay | Admitting: Family Medicine

## 2022-02-01 ENCOUNTER — Ambulatory Visit (INDEPENDENT_AMBULATORY_CARE_PROVIDER_SITE_OTHER): Payer: Medicare HMO | Admitting: Family Medicine

## 2022-02-01 VITALS — BP 126/68 | HR 62 | Ht 63.2 in | Wt 154.0 lb

## 2022-02-01 DIAGNOSIS — E785 Hyperlipidemia, unspecified: Secondary | ICD-10-CM | POA: Diagnosis not present

## 2022-02-01 DIAGNOSIS — E559 Vitamin D deficiency, unspecified: Secondary | ICD-10-CM | POA: Diagnosis not present

## 2022-02-01 DIAGNOSIS — M8589 Other specified disorders of bone density and structure, multiple sites: Secondary | ICD-10-CM

## 2022-02-01 DIAGNOSIS — Z0000 Encounter for general adult medical examination without abnormal findings: Secondary | ICD-10-CM

## 2022-02-01 DIAGNOSIS — E2839 Other primary ovarian failure: Secondary | ICD-10-CM | POA: Diagnosis not present

## 2022-02-01 DIAGNOSIS — Z79899 Other long term (current) drug therapy: Secondary | ICD-10-CM | POA: Diagnosis not present

## 2022-02-01 DIAGNOSIS — E538 Deficiency of other specified B group vitamins: Secondary | ICD-10-CM | POA: Diagnosis not present

## 2022-02-01 DIAGNOSIS — N6099 Unspecified benign mammary dysplasia of unspecified breast: Secondary | ICD-10-CM | POA: Diagnosis not present

## 2022-02-01 NOTE — Assessment & Plan Note (Signed)
dexa ordered Pt will call to schedule  No falls or fx  inst to take D3 daily 2000 iu  Increase wt bearing exercise as tolerated

## 2022-02-01 NOTE — Assessment & Plan Note (Signed)
Disc goals for lipids and reasons to control them Rev last labs with pt Rev low sat fat diet in detail LDL down to 109 Good HDL  Commended good habits

## 2022-02-01 NOTE — Assessment & Plan Note (Signed)
Level 37.8  Recommend 2000 iu D3 daily  Disc imp to bone and overall health

## 2022-02-01 NOTE — Assessment & Plan Note (Signed)
Reviewed health habits including diet and exercise and skin cancer prevention Reviewed appropriate screening tests for age  Also reviewed health mt list, fam hx and immunization status , as well as social and family history   See [facility] Labs reviewed  Mammogram utd  dexa ordered  Considering shingrix  Colonoscopy due 2024/guideline change

## 2022-02-01 NOTE — Patient Instructions (Addendum)
If you are interested in the new shingles vaccine (Shingrix) - call your local pharmacy to check on coverage and availability  If affordable, get on a wait list at your pharmacy to get the vaccine.   Call the [facility] breast center to schedule your DEXA (bone density test)  I put the order in   Take 2000 iu of vitamin D3 daily

## 2022-02-01 NOTE — Progress Notes (Signed)
Subjective:    Patient ID: Jenny Mcfarland, female    DOB: April 15, 1951, 71 y.o.   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  Wants to walk more   Amw reviewed   Immunization History  Administered Date(s) Administered   Influenza Split 06/21/2012   Influenza,inj,Quad PF,6+ Mos 06/13/2013, 07/01/2014, 06/16/2015, 06/25/2016, 06/17/2017, 06/29/2018, 06/19/2019, 05/21/2020, 06/26/2021   PFIZER(Purple Top)SARS-COV-2 Vaccination 10/10/2019, 10/31/2019, 08/03/2020   Pneumococcal Conjugate-13 12/21/2016   Pneumococcal Polysaccharide-23 01/27/2021   Tdap 05/25/2011    Zoster status : has not had shingrix yet  Is worried about side effects   She had some side eff from pna vaccine-chills/aches and fever Also the flu shot and had some side effects /chills   Dexa 06/2018 osteopenia  Falls/fx: none , no fx  Supplements vit D3  3-4 times weekly  D level is 37.8   Exercise : yard work   Colonoscopy 02/2016 with 5 y recall - was due but then was told that recall changed to 7 years   Mammogram 08/2021= biopsy  Atypical lobular hyperplasia -will f/u in sept Sees Dr Bary Castilla Consider surg excision if needed  Self breast exam Has fam h/o breast cancer    GERD Takes omeprazole 20 mg bid  Past -barrett's esoph  Sees Dr Fuller Plan  B12 deficiency Lab Results  Component Value Date   VITAMINB12 732 01/22/2022  Takes 1000 mcg daily  Has not needed more shots      Hyperlipidemia Lab Results  Component Value Date   CHOL 199 01/22/2022   CHOL 207 (H) 01/19/2021   CHOL 216 (H) 01/04/2020   Lab Results  Component Value Date   HDL 72.70 01/22/2022   HDL 73.10 01/19/2021   HDL 69.20 01/04/2020   Lab Results  Component Value Date   LDLCALC 109 (H) 01/22/2022   LDLCALC 116 (H) 01/19/2021   LDLCALC 128 (H) 01/04/2020   Lab Results  Component Value Date   TRIG 88.0 01/22/2022   TRIG 88.0 01/19/2021   TRIG 97.0 01/04/2020   Lab Results  Component Value Date   CHOLHDL 3 01/22/2022   CHOLHDL 3 01/19/2021   CHOLHDL 3 01/04/2020   Lab Results  Component Value Date   LDLDIRECT 135.4 09/17/2013   LDLDIRECT 125.5 09/11/2012   LDLDIRECT 121.7 05/25/2011   LDL is down a bit at 109 Good HDL    Doing well with diet  Eating smaller meals and also lost some weight   The 10-year ASCVD risk score (Arnett DK, et al., 2019) is: 8.7%   Values used to calculate the score:     Age: 67 years     Sex: Female     Is Non-Hispanic African American: No     Diabetic: No     Tobacco smoker: No     Systolic Blood Pressure: 240 mmHg     Is BP treated: No  HDL Cholesterol: 72.7 mg/dL     Total Cholesterol: 199 mg/dL  Other labs  Lab Results  Component Value Date   WBC 5.8 01/22/2022   HGB 12.0 01/22/2022   HCT 37.1 01/22/2022   MCV 84.6 01/22/2022   PLT 223.0 01/22/2022   Lab Results  Component Value Date   CREATININE 0.76 01/22/2022   BUN 8 01/22/2022   NA 143 01/22/2022   K 4.2 01/22/2022   CL 106 01/22/2022   CO2 30 01/22/2022   Lab Results  Component Value Date   ALT 8 01/22/2022   AST 11 01/22/2022   ALKPHOS 69 01/22/2022   BILITOT 1.0 01/22/2022    Lab Results  Component Value Date   TSH 2.06 01/22/2022    Patient Active Problem List   Diagnosis Date Noted   Atypical lobular hyperplasia of breast 02/01/2022   Routine general medical examination at a health care facility 01/21/2022   Abnormal endometrial  ultrasound 03/03/2021   Hearing loss 01/27/2021   Post-menopausal bleeding 01/27/2021   Vitamin B12 deficiency 01/19/2021   Current use of proton pump inhibitor 01/18/2021   Welcome to Medicare preventive visit 12/21/2016   Estrogen deficiency 06/04/2015   Vitamin D deficiency 09/23/2014   Thoracic or lumbosacral neuritis or radiculitis, unspecified 05/21/2013   Gynecological examination 05/25/2011   Other screening mammogram 05/25/2011   Encounter for general adult medical examination with abnormal findings 05/25/2011   Osteopenia 12/27/2010   IBS (irritable bowel syndrome) 12/25/2010   Allergic rhinitis 12/25/2010   Asthma 12/25/2010   Mild hyperlipidemia 12/25/2010   Family history of breast cancer 12/25/2010   Family history of aortic aneurysm 12/25/2010   GERD 11/08/2008   BARRETT'S ESOPHAGUS 11/08/2008   Past Medical History:  Diagnosis Date   Allergic rhinitis    Arthritis    Asthma    WELL CONTROLLED   Barrett's esophagus 9326   Complication of anesthesia    GERD (gastroesophageal reflux disease) 11/2008   Hemorrhoids    Hiatal hernia    SMALL   History of chicken pox    IBS (irritable bowel syndrome)    Osteopenia    Peptic stricture of esophagus 11-2008   PONV (postoperative nausea and vomiting)    N/V WITH GALLBLADDER   Recurrent sinusitis    Past Surgical History:  Procedure Laterality Date   BREAST BIOPSY Right 07/26/2017   INTRADUCTAL PAPILLOMA.    BREAST BIOPSY Left 08/27/2020   affirm bx #1-"X" clip-path pending   BREAST BIOPSY Left 08/27/2020   affirm bx-#2-"coil: clip-path pending   BREAST BIOPSY Right 10/13/2021   [place] bx, heart marker, path pending   BREAST BIOPSY Right 10/29/2021   right stereo bx X clip -path pending   BREAST BIOPSY WITH RADIO FREQUENCY LOCALIZER Left 09/26/2020   Procedure: BREAST BIOPSY WITH RADIO FREQUENCY LOCALIZER;  Surgeon: Herbert Pun, MD;  Location: [facility] OR[facility];  Service: General;  Laterality: Left;   BREAST  LUMPECTOMY Right 08/2017   BREAST LUMPECTOMY WITH NEEDLE LOCALIZATION Right 09/09/2017   Procedure: BREAST LUMPECTOMY WITH NEEDLE LOCALIZATION;  Surgeon: Leonie Green, MD;  Location: [facility] OR[facility];  Service: General;  Laterality: Right;   CHOLECYSTECTOMY  2010   TUBAL LIGATION     Social History   Tobacco Use   Smoking status: Never   Smokeless tobacco: Never  Vaping Use   Vaping Use: Never used  Substance Use Topics   Alcohol use: Yes    Alcohol/week: 0.0 standard drinks    Comment: RARE  Drug use: No   Family History  Problem Relation Age of Onset   Aortic aneurysm Mother    Heart disease Mother    Heart attack Brother        early 7's   Arthritis Brother    Breast cancer Sister 65   Colon cancer Neg Hx    Allergies  Allergen Reactions   Levofloxacin Itching and Other (See Comments)    Felt like she had bugs crawling all over her.   Tessalon [Benzonatate] Nausea And Vomiting    Made GERD worse   Current Outpatient Medications on File Prior to Visit  Medication Sig Dispense Refill   dicyclomine (BENTYL) 10 MG capsule Take 1 capsule by mouth as needed.     fexofenadine (ALLEGRA) 180 MG tablet Take 180 mg by mouth every morning.     Fluticasone-Salmeterol (ADVAIR DISKUS) 250-50 MCG/DOSE AEPB Inhale 1 puff into the lungs 2 (two) times daily as needed. (Patient taking differently: Inhale 1 puff into the lungs 2 (two) times daily.) 60 each 11   levalbuterol (XOPENEX HFA) 45 MCG/ACT inhaler Inhale 1 puff into the lungs every 4 (four) hours as needed for wheezing.     omeprazole (PRILOSEC) 20 MG capsule Take 1 capsule (20 mg total) by mouth 2 (two) times daily before a meal. 60 capsule 11   Probiotic Product (ALIGN) 4 MG CAPS Take 1 capsule by mouth daily as needed (upset stomach).     vitamin B-12 (CYANOCOBALAMIN) 1000 MCG tablet Take 1,000 mcg by mouth daily.     zolpidem (AMBIEN) 5 MG tablet TAKE 1/2 TO 1 (ONE-HALF TO ONE) TABLET BY MOUTH AT BEDTIME 30 tablet 3   No  current facility-administered medications on file prior to visit.    Review of Systems  Constitutional:  Negative for activity change, appetite change, fatigue, fever and unexpected weight change.  HENT:  Negative for congestion, ear pain, rhinorrhea, sinus pressure and sore throat.   Eyes:  Negative for pain, redness and visual disturbance.  Respiratory:  Negative for cough, shortness of breath and wheezing.   Cardiovascular:  Negative for chest pain and palpitations.  Gastrointestinal:  Negative for abdominal pain, blood in stool, constipation and diarrhea.  Endocrine: Negative for polydipsia and polyuria.  Genitourinary:  Negative for dysuria, frequency and urgency.  Musculoskeletal:  Negative for arthralgias, back pain and myalgias.  Skin:  Negative for pallor and rash.  Allergic/Immunologic: Negative for environmental allergies.  Neurological:  Negative for dizziness, syncope and headaches.  Hematological:  Negative for adenopathy. Does not bruise/bleed easily.  Psychiatric/Behavioral:  Negative for decreased concentration and dysphoric mood. The patient is not nervous/anxious.       Objective:   Physical Exam Constitutional:      General: She is not in acute distress.    Appearance: Normal appearance. She is well-developed and normal weight. She is not ill-appearing or diaphoretic.  HENT:     Head: Normocephalic and atraumatic.     Right Ear: Tympanic membrane, ear canal and external ear normal.     Left Ear: Tympanic membrane, ear canal and external ear normal.     Nose: Nose normal. No congestion.     Mouth/Throat:     Mouth: Mucous membranes are moist.     Pharynx: Oropharynx is clear. No posterior oropharyngeal erythema.  Eyes:     General: No scleral icterus.    Extraocular Movements: Extraocular movements intact.     Conjunctiva/sclera: Conjunctivae normal.     Pupils: Pupils are equal,  round, and reactive to light.  Neck:     Thyroid: No thyromegaly.     Vascular:  No carotid bruit or JVD.  Cardiovascular:     Rate and Rhythm: Normal rate and regular rhythm.     Pulses: Normal pulses.     Heart sounds: Normal heart sounds.    No gallop.  Pulmonary:     Effort: Pulmonary effort is normal. No respiratory distress.     Breath sounds: Normal breath sounds. No wheezing.     Comments: Good air exch Chest:     Chest wall: No tenderness.  Abdominal:     General: Bowel sounds are normal. There is no distension or abdominal bruit.     Palpations: Abdomen is soft. There is no mass.     Tenderness: There is no abdominal tenderness.     Hernia: No hernia is present.  Genitourinary:    Comments: Breast exam: No mass, nodules, thickening, tenderness, bulging, retraction, inflamation, nipple discharge or skin changes noted.  No axillary or clavicular LA.      Post surgical changes noted L breast  Musculoskeletal:        General: No tenderness. Normal range of motion.     Cervical back: Normal range of motion and neck supple. No rigidity. No muscular tenderness.     Right lower leg: No edema.     Left lower leg: No edema.     Comments: No kyphosis   Lymphadenopathy:     Cervical: No cervical adenopathy.  Skin:    General: Skin is warm and dry.     Coloration: Skin is not pale.     Findings: No erythema or rash.     Comments: Solar lentigines diffusely Some sks  Neurological:     Mental Status: She is alert. Mental status is at baseline.     Cranial Nerves: No cranial nerve deficit.     Motor: No abnormal muscle tone.     Coordination: Coordination normal.     Gait: Gait normal.     Deep Tendon Reflexes: Reflexes are normal and symmetric. Reflexes normal.  Psychiatric:        Mood and Affect: Mood normal.        Cognition and Memory: Cognition and memory normal.          Assessment & Plan:   Problem List Items Addressed This Visit       Musculoskeletal and Integument   Osteopenia    dexa ordered Pt will call to schedule  No falls or fx   inst to take D3 daily 2000 iu  Increase wt bearing exercise as tolerated          Other   Atypical lobular hyperplasia of breast    R breast  Under care of surgeon/Dr Byrnett Close monitoring  Imaging planned for fall        Current use of proton pump inhibitor    Replacing B12 and vit D       Estrogen deficiency   Relevant Orders   DG Bone Density   Mild hyperlipidemia    Disc goals for lipids and reasons to control them Rev last labs with pt Rev low sat fat diet in detail LDL down to 109 Good HDL  Commended good habits       Routine general medical examination at a health care facility - Primary    Reviewed health habits including diet and exercise and skin cancer prevention Reviewed appropriate screening tests for  age  Also reviewed health mt list, fam hx and immunization status , as well as social and family history   See Hpi Labs reviewed  Mammogram utd  dexa ordered  Considering shingrix  Colonoscopy due 2024/guideline change        Vitamin B12 deficiency    Lab Results  Component Value Date   VITAMINB12 732 01/22/2022  Better with oral 1000 mcg daily       Vitamin D deficiency    Level 37.8  Recommend 2000 iu D3 daily  Disc imp to bone and overall health

## 2022-02-01 NOTE — Assessment & Plan Note (Signed)
R breast  Under care of surgeon/Dr Byrnett Close monitoring  Imaging planned for fall

## 2022-02-01 NOTE — Assessment & Plan Note (Signed)
Lab Results  Component Value Date   OPRAFOAD52 589 01/22/2022   Better with oral 1000 mcg daily

## 2022-02-01 NOTE — Assessment & Plan Note (Signed)
Replacing B12 and vit D

## 2022-02-03 DIAGNOSIS — M533 Sacrococcygeal disorders, not elsewhere classified: Secondary | ICD-10-CM | POA: Diagnosis not present

## 2022-02-03 DIAGNOSIS — M5136 Other intervertebral disc degeneration, lumbar region: Secondary | ICD-10-CM | POA: Diagnosis not present

## 2022-02-03 DIAGNOSIS — G8929 Other chronic pain: Secondary | ICD-10-CM | POA: Diagnosis not present

## 2022-02-03 DIAGNOSIS — M5441 Lumbago with sciatica, right side: Secondary | ICD-10-CM | POA: Diagnosis not present

## 2022-02-03 DIAGNOSIS — M47816 Spondylosis without myelopathy or radiculopathy, lumbar region: Secondary | ICD-10-CM | POA: Diagnosis not present

## 2022-02-15 DIAGNOSIS — H524 Presbyopia: Secondary | ICD-10-CM | POA: Diagnosis not present

## 2022-02-15 DIAGNOSIS — Z83511 Family history of glaucoma: Secondary | ICD-10-CM | POA: Diagnosis not present

## 2022-02-15 DIAGNOSIS — H25013 Cortical age-related cataract, bilateral: Secondary | ICD-10-CM | POA: Diagnosis not present

## 2022-02-15 DIAGNOSIS — H5203 Hypermetropia, bilateral: Secondary | ICD-10-CM | POA: Diagnosis not present

## 2022-02-15 DIAGNOSIS — H2513 Age-related nuclear cataract, bilateral: Secondary | ICD-10-CM | POA: Diagnosis not present

## 2022-03-26 ENCOUNTER — Other Ambulatory Visit: Payer: Self-pay | Admitting: General Surgery

## 2022-03-26 DIAGNOSIS — N6099 Unspecified benign mammary dysplasia of unspecified breast: Secondary | ICD-10-CM

## 2022-03-26 NOTE — Addendum Note (Signed)
Addended by: Robert Bellow on: 03/26/2022 11:18 AM   Modules accepted: Orders

## 2022-03-31 ENCOUNTER — Ambulatory Visit
Admission: RE | Admit: 2022-03-31 | Discharge: 2022-03-31 | Disposition: A | Discharge status: Home / Self Care | Payer: Medicare HMO | Source: Ambulatory Visit | Attending: Family Medicine | Admitting: Family Medicine

## 2022-03-31 DIAGNOSIS — M810 Age-related osteoporosis without current pathological fracture: Secondary | ICD-10-CM | POA: Diagnosis not present

## 2022-03-31 DIAGNOSIS — M85851 Other specified disorders of bone density and structure, right thigh: Secondary | ICD-10-CM | POA: Diagnosis not present

## 2022-03-31 DIAGNOSIS — E2839 Other primary ovarian failure: Secondary | ICD-10-CM | POA: Diagnosis not present

## 2022-04-26 ENCOUNTER — Ambulatory Visit: Payer: Medicare HMO | Admitting: Gastroenterology

## 2022-04-26 ENCOUNTER — Encounter: Payer: Self-pay | Admitting: Gastroenterology

## 2022-04-26 VITALS — BP 112/80 | HR 51

## 2022-04-26 DIAGNOSIS — K582 Mixed irritable bowel syndrome: Secondary | ICD-10-CM | POA: Diagnosis not present

## 2022-04-26 DIAGNOSIS — Z86010 Personal history of colonic polyps: Secondary | ICD-10-CM | POA: Diagnosis not present

## 2022-04-26 DIAGNOSIS — K2100 Gastro-esophageal reflux disease with esophagitis, without bleeding: Secondary | ICD-10-CM | POA: Diagnosis not present

## 2022-04-26 MED ORDER — DICYCLOMINE HCL 10 MG PO CAPS: 10.0000 mg | ORAL_CAPSULE | Freq: Three times a day (TID) | ORAL | 11 refills | Status: AC | PRN

## 2022-04-26 NOTE — Patient Instructions (Signed)
We have sent the following medications to your pharmacy for you to pick up at your convenience: dicyclomine.   Continue over the counter omeprazole.   Kegel Exercises  Kegel exercises can help strengthen your pelvic floor muscles. The pelvic floor is a group of muscles that support your rectum, small intestine, and bladder. In females, pelvic floor muscles also help support the uterus. These muscles help you control the flow of urine and stool (feces). Kegel exercises are painless and simple. They do not require any equipment. Your provider may suggest Kegel exercises to: Improve bladder and bowel control. Improve sexual response. Improve weak pelvic floor muscles after surgery to remove the uterus (hysterectomy) or after pregnancy, in females. Improve weak pelvic floor muscles after prostate gland removal or surgery, in males. Kegel exercises involve squeezing your pelvic floor muscles. These are the same muscles you squeeze when you try to stop the flow of urine or keep from passing gas. The exercises can be done while sitting, standing, or lying down, but it is best to vary your position. Ask your health care provider which exercises are safe for you. Do exercises exactly as told by your health care provider and adjust them as directed. Do not begin these exercises until told by your health care provider. Exercises How to do Kegel exercises: Squeeze your pelvic floor muscles tight. You should feel a tight lift in your rectal area. If you are a female, you should also feel a tightness in your vaginal area. Keep your stomach, buttocks, and legs relaxed. Hold the muscles tight for up to 10 seconds. Breathe normally. Relax your muscles for up to 10 seconds. Repeat as told by your health care provider. Repeat this exercise daily as told by your health care provider. Continue to do this exercise for at least 4-6 weeks, or for as long as told by your health care provider. You may be referred to a  physical therapist who can help you learn more about how to do Kegel exercises. Depending on your condition, your health care provider may recommend: Varying how long you squeeze your muscles. Doing several sets of exercises every day. Doing exercises for several weeks. Making Kegel exercises a part of your regular exercise routine. This information is not intended to replace advice given to you by your health care provider. Make sure you discuss any questions you have with your health care provider. Document Revised: 12/25/2020 Document Reviewed: 12/25/2020 Elsevier Patient Education  [place] providers would like to encourage you to use [facility] to communicate with providers for non-urgent requests or questions.  Due to long hold times on the telephone, sending your provider a message by [facility] may be a faster and more efficient way to get a response.  Please allow 48 business hours for a response.  Please remember that this is for non-urgent requests.   Due to recent changes in healthcare laws, you may see the results of your imaging and laboratory studies on MyChart before your provider has had a chance to review them.  We understand that in some cases there may be results that are confusing or concerning to you. Not all laboratory results come back in the same time frame and the provider may be waiting for multiple results in order to interpret others.  Please give [place] 48 hours in order for your provider to thoroughly review all the results before contacting the office for clarification of your results.   Thank you for choosing  me and [facility].  Pricilla Riffle. Dagoberto Ligas., MD., Marval Regal

## 2022-04-26 NOTE — Progress Notes (Signed)
    Assessment     GERD with LA Class B esophagitis IBS, alternating pattern  Personal history of adenomatous colon polyps   Recommendations    Continue omeprazole OTC 20 mg p.o. 2 times daily and follow antireflux measures. Continue dicyclomine 10 mg p.o. 3 times daily as needed.  MiraLAX daily and titrate dose for complete bowel movement daily. Kegel exercises 5 times daily long-term. Avoid dietary stressors.  Surveillance colonoscopy due in July 2024   HPI    This is a 71 year old female returning for follow-up of GERD and IBS.  She has a history of LA class B esophagitis.  Short segment Barrett's without dysplasia was diagnosed years ago however her most recent upper endoscopies have not shown Barrett's and she is no longer in surveillance.  Her reflux symptoms are well controlled on her current regimen.  She has an alternating bowel pattern with intermittent mild constipation occasionally followed by episodes of urgent, looser, softer stools.  She has had occasional severe urgency, fecal incontinence. She takes dicyclomine intermittently with effective relief of symptoms.    Labs / Imaging       Latest Ref Rng & Units 01/22/2022   10:33 AM 01/19/2021   10:11 AM 01/04/2020   10:17 AM  Hepatic Function  Total Protein 6.0 - 8.3 g/dL 6.5  6.5  7.2   Albumin 3.5 - 5.2 g/dL 4.0  4.0  4.1   AST 0 - 37 U/L $Remo'11  12  13   'NBIqn$ ALT 0 - 35 U/L $Remo'8  11  10   'qFBkf$ Alk Phosphatase 39 - 117 U/L 69  75  78   Total Bilirubin 0.2 - 1.2 mg/dL 1.0  0.9  0.7        Latest Ref Rng & Units 01/22/2022   10:33 AM 01/19/2021   10:11 AM 01/04/2020   10:17 AM  CBC  WBC 4.0 - 10.5 K/uL 5.8  5.6  6.7   Hemoglobin 12.0 - 15.0 g/dL 12.0  11.8  12.2   Hematocrit 36.0 - 46.0 % 37.1  35.6  36.8   Platelets 150.0 - 400.0 K/uL 223.0  232.0  245.0    Current Medications, Allergies, Past Medical History, Past Surgical History, Family History and Social History were reviewed in [employer]  record.   Physical Exam: General: Well developed, well nourished, no acute distress Head: Normocephalic and atraumatic Eyes: Sclerae anicteric, EOMI Ears: Normal auditory acuity Mouth: Not examined Lungs: Clear throughout to auscultation Heart: Regular rate and rhythm; no murmurs, rubs or bruits Abdomen: Soft, non tender and non distended. No masses, hepatosplenomegaly or hernias noted. Normal Bowel sounds Rectal: Not done Musculoskeletal: Symmetrical with no gross deformities  Pulses:  Normal pulses noted Extremities: No clubbing, cyanosis, edema or deformities noted Neurological: Alert oriented x 4, grossly nonfocal Psychological:  Alert and cooperative. Normal mood and affect   Emet Rafanan T. Fuller Plan, MD 04/26/2022, 2:37 PM

## 2022-04-27 ENCOUNTER — Other Ambulatory Visit: Payer: Self-pay | Admitting: General Surgery

## 2022-04-27 DIAGNOSIS — R9389 Abnormal findings on diagnostic imaging of other specified body structures: Secondary | ICD-10-CM

## 2022-05-18 DIAGNOSIS — J301 Allergic rhinitis due to pollen: Secondary | ICD-10-CM | POA: Diagnosis not present

## 2022-05-18 DIAGNOSIS — J3089 Other allergic rhinitis: Secondary | ICD-10-CM | POA: Diagnosis not present

## 2022-05-18 DIAGNOSIS — J4530 Mild persistent asthma, uncomplicated: Secondary | ICD-10-CM | POA: Diagnosis not present

## 2022-05-18 DIAGNOSIS — H1045 Other chronic allergic conjunctivitis: Secondary | ICD-10-CM | POA: Diagnosis not present

## 2022-05-20 ENCOUNTER — Ambulatory Visit
Admission: RE | Admit: 2022-05-20 | Discharge: 2022-05-20 | Disposition: A | Discharge status: Home / Self Care | Payer: Medicare HMO | Source: Ambulatory Visit | Attending: General Surgery | Admitting: General Surgery

## 2022-05-20 DIAGNOSIS — R9389 Abnormal findings on diagnostic imaging of other specified body structures: Secondary | ICD-10-CM

## 2022-05-20 DIAGNOSIS — N6311 Unspecified lump in the right breast, upper outer quadrant: Secondary | ICD-10-CM | POA: Insufficient documentation

## 2022-05-20 DIAGNOSIS — R922 Inconclusive mammogram: Secondary | ICD-10-CM | POA: Diagnosis not present

## 2022-05-27 ENCOUNTER — Other Ambulatory Visit: Payer: Self-pay | Admitting: Family Medicine

## 2022-05-27 DIAGNOSIS — N6099 Unspecified benign mammary dysplasia of unspecified breast: Secondary | ICD-10-CM | POA: Diagnosis not present

## 2022-05-27 DIAGNOSIS — R928 Other abnormal and inconclusive findings on diagnostic imaging of breast: Secondary | ICD-10-CM

## 2022-06-01 ENCOUNTER — Telehealth: Payer: Self-pay | Admitting: *Deleted

## 2022-06-01 NOTE — Patient Outreach (Signed)
  Care Coordination   Initial Visit Note   06/01/2022 Name: Jenny Mcfarland MRN: [id number] DOB: 11/13/1950  Jenny Mcfarland is a 71 y.o. year old female who sees Jenny Mcfarland, Jenny Fanny, MD for primary care. I spoke with  Jenny Mcfarland by phone today.  What matters to the patients health and wellness today?  No health concerns voiced. RN discussed [place], RN, SW, and Pharmacist. Patient declined services.     Goals Addressed             This Visit's Progress    Advised to follow up with PCP for AWV and Vaccines          SDOH assessments and interventions completed:  Yes     Care Coordination Interventions Activated:  Yes  Care Coordination Interventions:  Yes, provided   Follow up plan: No further intervention required.   Encounter Outcome:  Pt. [place] [facility] [phone]

## 2022-06-03 ENCOUNTER — Other Ambulatory Visit: Payer: Self-pay | Admitting: Family Medicine

## 2022-06-03 NOTE — Telephone Encounter (Signed)
Name of Medication: Ambien Name of Pharmacy: [place] or Written Date and Quantity: 12/01/21 #30 tabs/ 3 refills Last Office Visit and Type: CPE on 02/01/22 Next Office Visit and Type: none scheduled

## 2022-06-17 ENCOUNTER — Ambulatory Visit (INDEPENDENT_AMBULATORY_CARE_PROVIDER_SITE_OTHER): Payer: Medicare HMO

## 2022-06-17 DIAGNOSIS — Z23 Encounter for immunization: Secondary | ICD-10-CM | POA: Diagnosis not present

## 2022-08-10 DIAGNOSIS — R030 Elevated blood-pressure reading, without diagnosis of hypertension: Secondary | ICD-10-CM | POA: Diagnosis not present

## 2022-08-10 DIAGNOSIS — Z7951 Long term (current) use of inhaled steroids: Secondary | ICD-10-CM | POA: Diagnosis not present

## 2022-08-10 DIAGNOSIS — Z807 Family history of other malignant neoplasms of lymphoid, hematopoietic and related tissues: Secondary | ICD-10-CM | POA: Diagnosis not present

## 2022-08-10 DIAGNOSIS — K589 Irritable bowel syndrome without diarrhea: Secondary | ICD-10-CM | POA: Diagnosis not present

## 2022-08-10 DIAGNOSIS — Z8249 Family history of ischemic heart disease and other diseases of the circulatory system: Secondary | ICD-10-CM | POA: Diagnosis not present

## 2022-08-10 DIAGNOSIS — G4700 Insomnia, unspecified: Secondary | ICD-10-CM | POA: Diagnosis not present

## 2022-08-10 DIAGNOSIS — Z825 Family history of asthma and other chronic lower respiratory diseases: Secondary | ICD-10-CM | POA: Diagnosis not present

## 2022-08-10 DIAGNOSIS — Z803 Family history of malignant neoplasm of breast: Secondary | ICD-10-CM | POA: Diagnosis not present

## 2022-08-10 DIAGNOSIS — Z008 Encounter for other general examination: Secondary | ICD-10-CM | POA: Diagnosis not present

## 2022-08-10 DIAGNOSIS — K219 Gastro-esophageal reflux disease without esophagitis: Secondary | ICD-10-CM | POA: Diagnosis not present

## 2022-08-10 DIAGNOSIS — H269 Unspecified cataract: Secondary | ICD-10-CM | POA: Diagnosis not present

## 2022-08-10 DIAGNOSIS — J45909 Unspecified asthma, uncomplicated: Secondary | ICD-10-CM | POA: Diagnosis not present

## 2022-09-09 DIAGNOSIS — J3089 Other allergic rhinitis: Secondary | ICD-10-CM | POA: Diagnosis not present

## 2022-09-09 DIAGNOSIS — J301 Allergic rhinitis due to pollen: Secondary | ICD-10-CM | POA: Diagnosis not present

## 2022-09-09 DIAGNOSIS — H1045 Other chronic allergic conjunctivitis: Secondary | ICD-10-CM | POA: Diagnosis not present

## 2022-09-09 DIAGNOSIS — J4530 Mild persistent asthma, uncomplicated: Secondary | ICD-10-CM | POA: Diagnosis not present

## 2022-09-10 ENCOUNTER — Ambulatory Visit: Payer: Medicare HMO | Admitting: Family Medicine

## 2022-09-15 ENCOUNTER — Ambulatory Visit (INDEPENDENT_AMBULATORY_CARE_PROVIDER_SITE_OTHER): Payer: Medicare HMO | Admitting: Family Medicine

## 2022-09-15 ENCOUNTER — Encounter: Payer: Self-pay | Admitting: Family Medicine

## 2022-09-15 VITALS — BP 120/60 | HR 61 | Temp 97.8°F | Resp 16 | Ht 63.2 in | Wt 155.0 lb

## 2022-09-15 DIAGNOSIS — R6889 Other general symptoms and signs: Secondary | ICD-10-CM | POA: Insufficient documentation

## 2022-09-15 DIAGNOSIS — J4520 Mild intermittent asthma, uncomplicated: Secondary | ICD-10-CM | POA: Diagnosis not present

## 2022-09-15 DIAGNOSIS — Z8249 Family history of ischemic heart disease and other diseases of the circulatory system: Secondary | ICD-10-CM

## 2022-09-15 DIAGNOSIS — E785 Hyperlipidemia, unspecified: Secondary | ICD-10-CM

## 2022-09-15 NOTE — Assessment & Plan Note (Signed)
Pt had abn PAD screening on the L at home from her ins co  Mild low VPS score of 0.79 on the left   Pt has no claudication symptoms Is fairly active  Borderline cholesterol  Nl pedal pulses on exam today   Given nl exam will continue to monitor in office  Would not order ABI tests unless that changed

## 2022-09-15 NOTE — Assessment & Plan Note (Signed)
Pt sees Dr Donneta Romberg  Using breo elllipta 200-25 once daily  Well controlled  Had abn spirometry from her ins co at home - rev with her  Pt notes she was getting over uri at that time No wheezing on exam today  Doing well

## 2022-09-15 NOTE — Assessment & Plan Note (Signed)
Brother in early 31s  Pt has borderline cholesterol Had cardiac w/u nl in 2011  Given info on coronary ca score test  She will let us know if she is interested

## 2022-09-15 NOTE — Assessment & Plan Note (Signed)
Last LDL was 109 with ratio of 3 Disc goals for lipids and reasons to control them Rev last labs with pt Rev low sat fat diet in detail   Disc consideration of cornary calcium score in the future in light of fam h/o vasc dz  If score was high would consider statin

## 2022-09-15 NOTE — Patient Instructions (Signed)
Today you have normal pulses in your feet   If you develop leg/ calf pain with walking (that gets better when you rest) let us know  We will keep an eye on that   Eat a health diet low in cholesterol   If you are interested in a coronary calcium score test to screen for coronary artery disease let us know

## 2022-09-15 NOTE — Progress Notes (Signed)
Subjective:    Patient ID: Jenny Mcfarland, female    DOB: 09/26/1950, 72 y.o.   MRN: [id number]  HPI Pt presents for f/u of a positive PAD screening and spirometry from her ins company   Wt Readings from Last 3 Encounters:  09/15/22 155 lb (70.3 kg)  02/01/22 154 lb (69.9 kg)  01/21/22 164 lb (74.4 kg)   27.28 kg/m  Vitals:   09/15/22 1507  BP: 120/60  Pulse: 61  Resp: 16  Temp: 97.8 F (36.6 C)  SpO2: 99%   Was screened on 12/12  PAD screen  Volume plethysmography Score of 0.79 in the L side considered mild (uncer 0.89 is abn) On R nl at 1.24  No claudication symptoms    Heart disease in her brother (in early 43s)  and mother    Saw cardiology in 2011 for cp Did treadmill test-normal   Spirometry  note FEV1/FVC score of 0.6 She has asthma and was getting over an illness   Has never smoked    Advair in past Now breo ellipta 200-25 mg daily Sees Dr Donneta Romberg    BP Readings from Last 3 Encounters:  09/15/22 120/60  04/26/22 112/80  02/01/22 126/68   Lab Results  Component Value Date   CHOL 199 01/22/2022   HDL 72.70 01/22/2022   LDLCALC 109 (H) 01/22/2022   LDLDIRECT 135.4 09/17/2013   TRIG 88.0 01/22/2022   CHOLHDL 3 01/22/2022    Patient Active Problem List   Diagnosis Date Noted   Abnormality on screening test 09/15/2022   Family history of early CAD 09/15/2022   Atypical lobular hyperplasia of breast 02/01/2022   Routine general medical examination at a health care facility 01/21/2022   Abnormal endometrial ultrasound 03/03/2021   Hearing loss 01/27/2021   Post-menopausal bleeding 01/27/2021   Vitamin B12 deficiency 01/19/2021   Current use of proton pump inhibitor 01/18/2021   Welcome to Medicare preventive visit 12/21/2016   Estrogen deficiency 06/04/2015   Vitamin D deficiency 09/23/2014   Thoracic or lumbosacral neuritis or radiculitis, unspecified 05/21/2013   Gynecological examination 05/25/2011   Other screening mammogram  05/25/2011   Encounter for general adult medical examination with abnormal findings 05/25/2011   Osteopenia 12/27/2010   IBS (irritable bowel syndrome) 12/25/2010   Allergic rhinitis 12/25/2010   Asthma 12/25/2010   Mild hyperlipidemia 12/25/2010   Family history of breast cancer 12/25/2010   Family history of aortic aneurysm 12/25/2010   GERD 11/08/2008   BARRETT'S ESOPHAGUS 11/08/2008   Past Medical History:  Diagnosis Date   Allergic rhinitis    Arthritis    Asthma    WELL CONTROLLED   Barrett's esophagus 2952   Complication of anesthesia    GERD (gastroesophageal reflux disease) 11/2008   Hemorrhoids    Hiatal hernia    SMALL   History of chicken pox    IBS (irritable bowel syndrome)    Osteopenia    Peptic stricture of esophagus 11-2008   PONV (postoperative nausea and vomiting)    N/V WITH GALLBLADDER   Recurrent sinusitis    Past Surgical History:  Procedure Laterality Date   BREAST BIOPSY Right 07/26/2017   INTRADUCTAL PAPILLOMA.    BREAST BIOPSY Left 08/27/2020   affirm bx #1-"X" clip-path pending   BREAST BIOPSY Left 08/27/2020   affirm bx-#2-"coil: clip-path pending   BREAST BIOPSY Right 10/13/2021   [place] bx, heart marker, FIBROEPITHELIAL PROLIFERATION WITH SCLEROSIS AND USUAL DUCTAL HYPERPLASIA   BREAST BIOPSY Right 10/29/2021  right stereo bx X clip - FIBROEPITHELIAL PROLIFERATION WITH SCLEROSIS   BREAST BIOPSY WITH RADIO FREQUENCY LOCALIZER Left 09/26/2020   Procedure: BREAST BIOPSY WITH RADIO FREQUENCY LOCALIZER;  Surgeon: Herbert Pun, MD;  Location: [facility];  Service: General;  Laterality: Left;   BREAST LUMPECTOMY Right 08/2017   BREAST LUMPECTOMY WITH NEEDLE LOCALIZATION Right 09/09/2017   Procedure: BREAST LUMPECTOMY WITH NEEDLE LOCALIZATION;  Surgeon: Leonie Green, MD;  Location: [facility];  Service: General;  Laterality: Right;   CHOLECYSTECTOMY  2010   TUBAL LIGATION     Social History   Tobacco Use   Smoking status: Never    Smokeless tobacco: Never  Vaping Use   Vaping Use: Never used  Substance Use Topics   Alcohol use: Yes    Alcohol/week: 0.0 standard drinks of alcohol    Comment: RARE   Drug use: No   Family History  Problem Relation Age of Onset   Aortic aneurysm Mother    Heart disease Mother    Heart attack Brother        early 29's   Arthritis Brother    Breast cancer Sister 45   Colon cancer Neg Hx    Allergies  Allergen Reactions   Levofloxacin Itching and Other (See Comments)    Felt like she had bugs crawling all over her.   Tessalon [Benzonatate] Nausea And Vomiting    Made GERD worse   Current Outpatient Medications on File Prior to Visit  Medication Sig Dispense Refill   BREO ELLIPTA 200-25 MCG/ACT AEPB Inhale 1 puff into the lungs daily.     dicyclomine (BENTYL) 10 MG capsule Take 1 capsule (10 mg total) by mouth 3 (three) times daily as needed. 90 capsule 11   fexofenadine (ALLEGRA) 180 MG tablet Take 180 mg by mouth every morning.     levalbuterol (XOPENEX HFA) 45 MCG/ACT inhaler Inhale 1 puff into the lungs every 4 (four) hours as needed for wheezing.     omeprazole (PRILOSEC) 20 MG capsule Take 1 capsule (20 mg total) by mouth 2 (two) times daily before a meal. 60 capsule 11   Probiotic Product (ALIGN) 4 MG CAPS Take 1 capsule by mouth daily as needed (upset stomach).     vitamin B-12 (CYANOCOBALAMIN) 1000 MCG tablet Take 1,000 mcg by mouth daily.     zolpidem (AMBIEN) 5 MG tablet TAKE 1/2 TO 1 (ONE-HALF TO ONE) TABLET BY MOUTH AT BEDTIME 30 tablet 3   No current facility-administered medications on file prior to visit.     Review of Systems  Constitutional:  Negative for activity change, appetite change, fatigue, fever and unexpected weight change.  HENT:  Negative for congestion, ear pain, rhinorrhea, sinus pressure and sore throat.   Eyes:  Negative for pain, redness and visual disturbance.  Respiratory:  Negative for cough, shortness of breath and wheezing.    Cardiovascular:  Negative for chest pain and palpitations.       No claudication symptoms   Gastrointestinal:  Negative for abdominal pain, blood in stool, constipation and diarrhea.  Endocrine: Negative for polydipsia and polyuria.  Genitourinary:  Negative for dysuria, frequency and urgency.  Musculoskeletal:  Negative for arthralgias, back pain and myalgias.  Skin:  Negative for pallor and rash.  Allergic/Immunologic: Negative for environmental allergies.  Neurological:  Negative for dizziness, syncope and headaches.  Hematological:  Negative for adenopathy. Does not bruise/bleed easily.  Psychiatric/Behavioral:  Negative for decreased concentration and dysphoric mood. The patient is not nervous/anxious.  Objective:   Physical Exam Constitutional:      General: She is not in acute distress.    Appearance: Normal appearance. She is well-developed and normal weight. She is not ill-appearing or diaphoretic.  HENT:     Head: Normocephalic and atraumatic.  Eyes:     General: No scleral icterus.    Conjunctiva/sclera: Conjunctivae normal.     Pupils: Pupils are equal, round, and reactive to light.  Neck:     Thyroid: No thyromegaly.     Vascular: No carotid bruit or JVD.  Cardiovascular:     Rate and Rhythm: Normal rate and regular rhythm.     Pulses: Normal pulses.     Heart sounds: Normal heart sounds. No murmur heard.    No gallop.     Comments: Pedal pulses are normal/full  Feet seem warm and well perfused  Pulmonary:     Effort: Pulmonary effort is normal. No respiratory distress.     Breath sounds: Normal breath sounds. No wheezing or rales.  Abdominal:     General: There is no distension or abdominal bruit.     Palpations: Abdomen is soft.  Musculoskeletal:     Cervical back: Normal range of motion and neck supple.     Right lower leg: No edema.     Left lower leg: No edema.  Lymphadenopathy:     Cervical: No cervical adenopathy.  Skin:    General: Skin is  warm and dry.     Coloration: Skin is not pale.     Findings: No bruising, lesion or rash.  Neurological:     Mental Status: She is alert.     Cranial Nerves: No cranial nerve deficit.     Coordination: Coordination normal.     Deep Tendon Reflexes: Reflexes are normal and symmetric. Reflexes normal.  Psychiatric:        Mood and Affect: Mood normal.           Assessment & Plan:   Problem List Items Addressed This Visit       Respiratory   Asthma    Pt sees Dr Donneta Romberg  Using breo elllipta 200-25 once daily  Well controlled  Had abn spirometry from her ins co at home - rev with her  Pt notes she was getting over uri at that time No wheezing on exam today  Doing well      Relevant Medications   BREO ELLIPTA 200-25 MCG/ACT AEPB     Other   Abnormality on screening test - Primary    Pt had abn PAD screening on the L at home from her ins co  Mild low VPS score of 0.79 on the left   Pt has no claudication symptoms Is fairly active  Borderline cholesterol  Nl pedal pulses on exam today   Given nl exam will continue to monitor in office  Would not order ABI tests unless that changed       Family history of early CAD    Brother in early 50s  Pt has borderline cholesterol Had cardiac w/u nl in 2011  Given info on coronary ca score test  She will let us know if she is interested      Mild hyperlipidemia    Last LDL was 109 with ratio of 3 Disc goals for lipids and reasons to control them Rev last labs with pt Rev low sat fat diet in detail   Disc consideration of cornary calcium score in the future in  light of fam h/o vasc dz  If score was high would consider statin

## 2022-09-27 ENCOUNTER — Other Ambulatory Visit: Payer: Medicare HMO

## 2022-10-05 ENCOUNTER — Ambulatory Visit
Admission: RE | Admit: 2022-10-05 | Discharge: 2022-10-05 | Disposition: A | Discharge status: Home / Self Care | Payer: Medicare HMO | Source: Ambulatory Visit | Attending: Family Medicine | Admitting: Family Medicine

## 2022-10-05 DIAGNOSIS — R928 Other abnormal and inconclusive findings on diagnostic imaging of breast: Secondary | ICD-10-CM

## 2022-10-06 DIAGNOSIS — G8929 Other chronic pain: Secondary | ICD-10-CM | POA: Diagnosis not present

## 2022-10-06 DIAGNOSIS — M5136 Other intervertebral disc degeneration, lumbar region: Secondary | ICD-10-CM | POA: Diagnosis not present

## 2022-10-06 DIAGNOSIS — M5442 Lumbago with sciatica, left side: Secondary | ICD-10-CM | POA: Diagnosis not present

## 2022-10-06 DIAGNOSIS — M47816 Spondylosis without myelopathy or radiculopathy, lumbar region: Secondary | ICD-10-CM | POA: Diagnosis not present

## 2022-10-06 DIAGNOSIS — M533 Sacrococcygeal disorders, not elsewhere classified: Secondary | ICD-10-CM | POA: Diagnosis not present

## 2022-10-14 DIAGNOSIS — M5127 Other intervertebral disc displacement, lumbosacral region: Secondary | ICD-10-CM | POA: Diagnosis not present

## 2022-10-14 DIAGNOSIS — M5416 Radiculopathy, lumbar region: Secondary | ICD-10-CM | POA: Diagnosis not present

## 2022-10-14 DIAGNOSIS — M542 Cervicalgia: Secondary | ICD-10-CM | POA: Diagnosis not present

## 2022-10-14 DIAGNOSIS — M9901 Segmental and somatic dysfunction of cervical region: Secondary | ICD-10-CM | POA: Diagnosis not present

## 2022-10-15 DIAGNOSIS — M542 Cervicalgia: Secondary | ICD-10-CM | POA: Diagnosis not present

## 2022-10-15 DIAGNOSIS — M5416 Radiculopathy, lumbar region: Secondary | ICD-10-CM | POA: Diagnosis not present

## 2022-10-15 DIAGNOSIS — M5127 Other intervertebral disc displacement, lumbosacral region: Secondary | ICD-10-CM | POA: Diagnosis not present

## 2022-10-15 DIAGNOSIS — M9901 Segmental and somatic dysfunction of cervical region: Secondary | ICD-10-CM | POA: Diagnosis not present

## 2022-10-19 DIAGNOSIS — M5416 Radiculopathy, lumbar region: Secondary | ICD-10-CM | POA: Diagnosis not present

## 2022-10-19 DIAGNOSIS — M542 Cervicalgia: Secondary | ICD-10-CM | POA: Diagnosis not present

## 2022-10-19 DIAGNOSIS — M9901 Segmental and somatic dysfunction of cervical region: Secondary | ICD-10-CM | POA: Diagnosis not present

## 2022-10-19 DIAGNOSIS — M5127 Other intervertebral disc displacement, lumbosacral region: Secondary | ICD-10-CM | POA: Diagnosis not present

## 2022-10-21 DIAGNOSIS — M5127 Other intervertebral disc displacement, lumbosacral region: Secondary | ICD-10-CM | POA: Diagnosis not present

## 2022-10-21 DIAGNOSIS — M9901 Segmental and somatic dysfunction of cervical region: Secondary | ICD-10-CM | POA: Diagnosis not present

## 2022-10-21 DIAGNOSIS — M542 Cervicalgia: Secondary | ICD-10-CM | POA: Diagnosis not present

## 2022-10-21 DIAGNOSIS — M5416 Radiculopathy, lumbar region: Secondary | ICD-10-CM | POA: Diagnosis not present

## 2022-10-26 DIAGNOSIS — M9901 Segmental and somatic dysfunction of cervical region: Secondary | ICD-10-CM | POA: Diagnosis not present

## 2022-10-26 DIAGNOSIS — M5127 Other intervertebral disc displacement, lumbosacral region: Secondary | ICD-10-CM | POA: Diagnosis not present

## 2022-10-26 DIAGNOSIS — M542 Cervicalgia: Secondary | ICD-10-CM | POA: Diagnosis not present

## 2022-10-26 DIAGNOSIS — M5416 Radiculopathy, lumbar region: Secondary | ICD-10-CM | POA: Diagnosis not present

## 2022-10-27 ENCOUNTER — Other Ambulatory Visit: Payer: Self-pay | Admitting: Sports Medicine

## 2022-10-27 DIAGNOSIS — M5441 Lumbago with sciatica, right side: Secondary | ICD-10-CM

## 2022-10-27 DIAGNOSIS — M5136 Other intervertebral disc degeneration, lumbar region: Secondary | ICD-10-CM

## 2022-10-28 ENCOUNTER — Other Ambulatory Visit: Payer: Self-pay | Admitting: Physical Medicine & Rehabilitation

## 2022-10-28 ENCOUNTER — Other Ambulatory Visit: Payer: Self-pay | Admitting: Sports Medicine

## 2022-10-28 DIAGNOSIS — M9901 Segmental and somatic dysfunction of cervical region: Secondary | ICD-10-CM | POA: Diagnosis not present

## 2022-10-28 DIAGNOSIS — M5127 Other intervertebral disc displacement, lumbosacral region: Secondary | ICD-10-CM | POA: Diagnosis not present

## 2022-10-28 DIAGNOSIS — M5136 Other intervertebral disc degeneration, lumbar region: Secondary | ICD-10-CM

## 2022-10-28 DIAGNOSIS — M5416 Radiculopathy, lumbar region: Secondary | ICD-10-CM | POA: Diagnosis not present

## 2022-10-28 DIAGNOSIS — M5441 Lumbago with sciatica, right side: Secondary | ICD-10-CM

## 2022-10-28 DIAGNOSIS — M542 Cervicalgia: Secondary | ICD-10-CM | POA: Diagnosis not present

## 2022-10-28 DIAGNOSIS — M5442 Lumbago with sciatica, left side: Secondary | ICD-10-CM

## 2022-10-28 DIAGNOSIS — M51369 Other intervertebral disc degeneration, lumbar region without mention of lumbar back pain or lower extremity pain: Secondary | ICD-10-CM

## 2022-11-02 DIAGNOSIS — M542 Cervicalgia: Secondary | ICD-10-CM | POA: Diagnosis not present

## 2022-11-02 DIAGNOSIS — M9901 Segmental and somatic dysfunction of cervical region: Secondary | ICD-10-CM | POA: Diagnosis not present

## 2022-11-02 DIAGNOSIS — M5127 Other intervertebral disc displacement, lumbosacral region: Secondary | ICD-10-CM | POA: Diagnosis not present

## 2022-11-02 DIAGNOSIS — M5416 Radiculopathy, lumbar region: Secondary | ICD-10-CM | POA: Diagnosis not present

## 2022-11-04 DIAGNOSIS — M5416 Radiculopathy, lumbar region: Secondary | ICD-10-CM | POA: Diagnosis not present

## 2022-11-04 DIAGNOSIS — M9901 Segmental and somatic dysfunction of cervical region: Secondary | ICD-10-CM | POA: Diagnosis not present

## 2022-11-04 DIAGNOSIS — M5127 Other intervertebral disc displacement, lumbosacral region: Secondary | ICD-10-CM | POA: Diagnosis not present

## 2022-11-04 DIAGNOSIS — M542 Cervicalgia: Secondary | ICD-10-CM | POA: Diagnosis not present

## 2022-11-12 ENCOUNTER — Ambulatory Visit
Admission: RE | Admit: 2022-11-12 | Discharge: 2022-11-12 | Disposition: A | Discharge status: Home / Self Care | Payer: Medicare HMO | Source: Ambulatory Visit | Attending: Sports Medicine | Admitting: Sports Medicine

## 2022-11-12 DIAGNOSIS — M5441 Lumbago with sciatica, right side: Secondary | ICD-10-CM

## 2022-11-12 DIAGNOSIS — M25552 Pain in left hip: Secondary | ICD-10-CM | POA: Diagnosis not present

## 2022-11-12 DIAGNOSIS — M545 Low back pain, unspecified: Secondary | ICD-10-CM | POA: Diagnosis not present

## 2022-11-12 DIAGNOSIS — M5136 Other intervertebral disc degeneration, lumbar region: Secondary | ICD-10-CM

## 2022-11-12 DIAGNOSIS — M47816 Spondylosis without myelopathy or radiculopathy, lumbar region: Secondary | ICD-10-CM | POA: Diagnosis not present

## 2022-11-23 DIAGNOSIS — M5416 Radiculopathy, lumbar region: Secondary | ICD-10-CM | POA: Diagnosis not present

## 2022-11-23 DIAGNOSIS — M48062 Spinal stenosis, lumbar region with neurogenic claudication: Secondary | ICD-10-CM | POA: Diagnosis not present

## 2022-12-02 ENCOUNTER — Other Ambulatory Visit: Payer: Self-pay | Admitting: Family Medicine

## 2022-12-03 NOTE — Telephone Encounter (Signed)
Name of Medication: Ambien Name of Pharmacy: [facility] [address] Last Fill or Written Date and Quantity: 06/03/22 #30 tabs/ 3 refills Last Office Visit and Type: F/U on 09/15/22 Next Office Visit and Type: none scheduled

## 2022-12-16 DIAGNOSIS — M5442 Lumbago with sciatica, left side: Secondary | ICD-10-CM | POA: Diagnosis not present

## 2022-12-16 DIAGNOSIS — M5416 Radiculopathy, lumbar region: Secondary | ICD-10-CM | POA: Diagnosis not present

## 2022-12-16 DIAGNOSIS — M5441 Lumbago with sciatica, right side: Secondary | ICD-10-CM | POA: Diagnosis not present

## 2022-12-16 DIAGNOSIS — M5126 Other intervertebral disc displacement, lumbar region: Secondary | ICD-10-CM | POA: Diagnosis not present

## 2022-12-16 DIAGNOSIS — M48062 Spinal stenosis, lumbar region with neurogenic claudication: Secondary | ICD-10-CM | POA: Diagnosis not present

## 2022-12-16 DIAGNOSIS — M5136 Other intervertebral disc degeneration, lumbar region: Secondary | ICD-10-CM | POA: Diagnosis not present

## 2022-12-16 DIAGNOSIS — M47816 Spondylosis without myelopathy or radiculopathy, lumbar region: Secondary | ICD-10-CM | POA: Diagnosis not present

## 2022-12-16 DIAGNOSIS — G8929 Other chronic pain: Secondary | ICD-10-CM | POA: Diagnosis not present

## 2022-12-16 DIAGNOSIS — M533 Sacrococcygeal disorders, not elsewhere classified: Secondary | ICD-10-CM | POA: Diagnosis not present

## 2022-12-22 ENCOUNTER — Telehealth: Payer: Self-pay | Admitting: Family Medicine

## 2022-12-22 NOTE — Telephone Encounter (Signed)
Contacted Clarnce Flock to schedule their annual wellness visit. Appointment made for 02/02/2023. Left a detailed message informing patient of these changes her her appt.   [facility] Care Guide [facility] AWV TEAM Direct Dial: [phone]

## 2023-01-31 ENCOUNTER — Telehealth: Payer: Self-pay | Admitting: Family Medicine

## 2023-01-31 DIAGNOSIS — E559 Vitamin D deficiency, unspecified: Secondary | ICD-10-CM

## 2023-01-31 DIAGNOSIS — E538 Deficiency of other specified B group vitamins: Secondary | ICD-10-CM

## 2023-01-31 DIAGNOSIS — Z79899 Other long term (current) drug therapy: Secondary | ICD-10-CM

## 2023-01-31 DIAGNOSIS — E785 Hyperlipidemia, unspecified: Secondary | ICD-10-CM

## 2023-01-31 NOTE — Telephone Encounter (Signed)
-----   Message from Alvina Chou sent at 01/17/2023  2:46 PM EDT ----- Regarding: lab orders for Tuesday, 6.4.24 Patient is scheduled for CPX labs, please order future labs, Thanks , Camelia Eng

## 2023-02-01 ENCOUNTER — Other Ambulatory Visit (INDEPENDENT_AMBULATORY_CARE_PROVIDER_SITE_OTHER): Payer: Medicare HMO

## 2023-02-01 DIAGNOSIS — E559 Vitamin D deficiency, unspecified: Secondary | ICD-10-CM | POA: Diagnosis not present

## 2023-02-01 DIAGNOSIS — E785 Hyperlipidemia, unspecified: Secondary | ICD-10-CM | POA: Diagnosis not present

## 2023-02-01 DIAGNOSIS — E538 Deficiency of other specified B group vitamins: Secondary | ICD-10-CM

## 2023-02-01 DIAGNOSIS — Z79899 Other long term (current) drug therapy: Secondary | ICD-10-CM

## 2023-02-02 ENCOUNTER — Ambulatory Visit (INDEPENDENT_AMBULATORY_CARE_PROVIDER_SITE_OTHER): Payer: Medicare HMO

## 2023-02-02 VITALS — Ht 64.5 in | Wt 155.0 lb

## 2023-02-02 DIAGNOSIS — Z0000 Encounter for general adult medical examination without abnormal findings: Secondary | ICD-10-CM | POA: Diagnosis not present

## 2023-02-02 LAB — CBC WITH DIFFERENTIAL/PLATELET
Basophils Absolute: 0 10*3/uL (ref 0.0–0.1)
Basophils Relative: 0.8 % (ref 0.0–3.0)
Eosinophils Absolute: 0.7 10*3/uL (ref 0.0–0.7)
Eosinophils Relative: 13.2 % — ABNORMAL HIGH (ref 0.0–5.0)
HCT: 33.1 % — ABNORMAL LOW (ref 36.0–46.0)
Hemoglobin: 10.8 g/dL — ABNORMAL LOW (ref 12.0–15.0)
Lymphocytes Relative: 25.7 % (ref 12.0–46.0)
Lymphs Abs: 1.4 10*3/uL (ref 0.7–4.0)
MCHC: 32.5 g/dL (ref 30.0–36.0)
MCV: 88.1 fl (ref 78.0–100.0)
Monocytes Absolute: 0.3 10*3/uL (ref 0.1–1.0)
Monocytes Relative: 5.9 % (ref 3.0–12.0)
Neutro Abs: 3.1 10*3/uL (ref 1.4–7.7)
Neutrophils Relative %: 54.4 % (ref 43.0–77.0)
Platelets: 250 10*3/uL (ref 150.0–400.0)
RBC: 3.76 Mil/uL — ABNORMAL LOW (ref 3.87–5.11)
RDW: 13.9 % (ref 11.5–15.5)
WBC: 5.6 10*3/uL (ref 4.0–10.5)

## 2023-02-02 LAB — COMPREHENSIVE METABOLIC PANEL
ALT: 14 U/L (ref 0–35)
AST: 17 U/L (ref 0–37)
Albumin: 4.2 g/dL (ref 3.5–5.2)
Alkaline Phosphatase: 61 U/L (ref 39–117)
BUN: 11 mg/dL (ref 6–23)
CO2: 25 mEq/L (ref 19–32)
Calcium: 9.7 mg/dL (ref 8.4–10.5)
Chloride: 105 mEq/L (ref 96–112)
Creatinine, Ser: 0.83 mg/dL (ref 0.40–1.20)
GFR: 71 mL/min (ref >60.00)
Glucose, Bld: 77 mg/dL (ref 70–99)
Potassium: 4 mEq/L (ref 3.5–5.1)
Sodium: 142 mEq/L (ref 135–145)
Total Bilirubin: 0.7 mg/dL (ref 0.2–1.2)
Total Protein: 6.9 g/dL (ref 6.0–8.3)

## 2023-02-02 LAB — LIPID PANEL
Cholesterol: 207 mg/dL — ABNORMAL HIGH (ref 0–200)
HDL: 77.7 mg/dL (ref >39.00)
LDL Cholesterol: 113 mg/dL — ABNORMAL HIGH (ref 0–99)
NonHDL: 128.87
Total CHOL/HDL Ratio: 3
Triglycerides: 80 mg/dL (ref 0.0–149.0)
VLDL: 16 mg/dL (ref 0.0–40.0)

## 2023-02-02 LAB — VITAMIN B12: Vitamin B-12: 644 pg/mL (ref 211–911)

## 2023-02-02 LAB — TSH: TSH: 1.34 u[IU]/mL (ref 0.35–5.50)

## 2023-02-02 LAB — VITAMIN D 25 HYDROXY (VIT D DEFICIENCY, FRACTURES): VITD: 34.61 ng/mL (ref 30.00–100.00)

## 2023-02-02 LAB — IRON: Iron: 56 ug/dL (ref 42–145)

## 2023-02-02 NOTE — Patient Instructions (Addendum)
Jenny Mcfarland , Thank you for taking time to come for your Medicare Wellness Visit. I appreciate your ongoing commitment to your health goals. Please review the following plan we discussed and let me know if I can assist you in the future.   These are the goals we discussed:  Goals      Advised to follow up with PCP for AWV and Vaccines     DIET - EAT MORE FRUITS AND VEGETABLES     Increase physical activity     Starting 01/01/19, I will continue to walk at least 20 minutes 3 days per week.      Patient Stated     01/04/2020, I will continue to walk at least 20 minutes 3 days per week.     Patient Stated     01/20/2021, I will continue to walk daily for about 30 minutes.      Patient Stated     Lose weight        This is a list of the screening recommended for you and due dates:  Health Maintenance  Topic Date Due   Hepatitis C Screening  Never done   Zoster (Shingles) Vaccine (1 of 2) Never done   Colon Cancer Screening  03/12/2021   DTaP/Tdap/Td vaccine (2 - Td or Tdap) 05/24/2021   COVID-19 Vaccine (4 - 2023-24 season) 04/30/2022   Flu Shot  03/31/2023   Mammogram  10/06/2023   Medicare Annual Wellness Visit  02/02/2024   DEXA scan (bone density measurement)  03/31/2024   Pneumonia Vaccine  Completed   HPV Vaccine  Aged Out    Advanced directives: on file  Conditions/risks identified: Aim for 30 minutes of exercise or brisk walking, 6-8 glasses of water, and 5 servings of fruits and vegetables each day.   Next appointment: Follow up in one year for your annual wellness visit 02/06/24 @ 10:15 televisit   Preventive Care 65 Years and Older, Female Preventive care refers to lifestyle choices and visits with your health care provider that can promote health and wellness. What does preventive care include? A yearly physical exam. This is also called an annual well check. Dental exams once or twice a year. Routine eye exams. Ask your health care provider how often you should  have your eyes checked. Personal lifestyle choices, including: Daily care of your teeth and gums. Regular physical activity. Eating a healthy diet. Avoiding tobacco and drug use. Limiting alcohol use. Practicing safe sex. Taking low-dose aspirin every day. Taking vitamin and mineral supplements as recommended by your health care provider. What happens during an annual well check? The services and screenings done by your health care provider during your annual well check will depend on your age, overall health, lifestyle risk factors, and family history of disease. Counseling  Your health care provider may ask you questions about your: Alcohol use. Tobacco use. Drug use. Emotional well-being. Home and relationship well-being. Sexual activity. Eating habits. History of falls. Memory and ability to understand (cognition). Work and work Astronomer. Reproductive health. Screening  You may have the following tests or measurements: Height, weight, and BMI. Blood pressure. Lipid and cholesterol levels. These may be checked every 5 years, or more frequently if you are over 49 years old. Skin check. Lung cancer screening. You may have this screening every year starting at age 69 if you have a 30-pack-year history of smoking and currently smoke or have quit within the past 15 years. Fecal occult blood test (FOBT) of the  stool. You may have this test every year starting at age 35. Flexible sigmoidoscopy or colonoscopy. You may have a sigmoidoscopy every 5 years or a colonoscopy every 10 years starting at age 84. Hepatitis C blood test. Hepatitis B blood test. Sexually transmitted disease (STD) testing. Diabetes screening. This is done by checking your blood sugar (glucose) after you have not eaten for a while (fasting). You may have this done every 1-3 years. Bone density scan. This is done to screen for osteoporosis. You may have this done starting at age 5. Mammogram. This may be done  every 1-2 years. Talk to your health care provider about how often you should have regular mammograms. Talk with your health care provider about your test results, treatment options, and if necessary, the need for more tests. Vaccines  Your health care provider may recommend certain vaccines, such as: Influenza vaccine. This is recommended every year. Tetanus, diphtheria, and acellular pertussis (Tdap, Td) vaccine. You may need a Td booster every 10 years. Zoster vaccine. You may need this after age 41. Pneumococcal 13-valent conjugate (PCV13) vaccine. One dose is recommended after age 77. Pneumococcal polysaccharide (PPSV23) vaccine. One dose is recommended after age 27. Talk to your health care provider about which screenings and vaccines you need and how often you need them. This information is not intended to replace advice given to you by your health care provider. Make sure you discuss any questions you have with your health care provider. Document Released: 09/12/2015 Document Revised: 05/05/2016 Document Reviewed: 06/17/2015 Elsevier Interactive Patient Education  2017 [employer].  Fall Prevention in the Home Falls can cause injuries. They can happen to people of all ages. There are many things you can do to make your home safe and to help prevent falls. What can I do on the outside of my home? Regularly fix the edges of walkways and driveways and fix any cracks. Remove anything that might make you trip as you walk through a door, such as a raised step or threshold. Trim any bushes or trees on the path to your home. Use bright outdoor lighting. Clear any walking paths of anything that might make someone trip, such as rocks or tools. Regularly check to see if handrails are loose or broken. Make sure that both sides of any steps have handrails. Any raised decks and porches should have guardrails on the edges. Have any leaves, snow, or ice cleared regularly. Use sand or salt on  walking paths during winter. Clean up any spills in your garage right away. This includes oil or grease spills. What can I do in the bathroom? Use night lights. Install grab bars by the toilet and in the tub and shower. Do not use towel bars as grab bars. Use non-skid mats or decals in the tub or shower. If you need to sit down in the shower, use a plastic, non-slip stool. Keep the floor dry. Clean up any water that spills on the floor as soon as it happens. Remove soap buildup in the tub or shower regularly. Attach bath mats securely with double-sided non-slip rug tape. Do not have throw rugs and other things on the floor that can make you trip. What can I do in the bedroom? Use night lights. Make sure that you have a light by your bed that is easy to reach. Do not use any sheets or blankets that are too big for your bed. They should not hang down onto the floor. Have a firm chair that  has side arms. You can use this for support while you get dressed. Do not have throw rugs and other things on the floor that can make you trip. What can I do in the kitchen? Clean up any spills right away. Avoid walking on wet floors. Keep items that you use a lot in easy-to-reach places. If you need to reach something above you, use a strong step stool that has a grab bar. Keep electrical cords out of the way. Do not use floor polish or wax that makes floors slippery. If you must use wax, use non-skid floor wax. Do not have throw rugs and other things on the floor that can make you trip. What can I do with my stairs? Do not leave any items on the stairs. Make sure that there are handrails on both sides of the stairs and use them. Fix handrails that are broken or loose. Make sure that handrails are as long as the stairways. Check any carpeting to make sure that it is firmly attached to the stairs. Fix any carpet that is loose or worn. Avoid having throw rugs at the top or bottom of the stairs. If you do  have throw rugs, attach them to the floor with carpet tape. Make sure that you have a light switch at the top of the stairs and the bottom of the stairs. If you do not have them, ask someone to add them for you. What else can I do to help prevent falls? Wear shoes that: Do not have high heels. Have rubber bottoms. Are comfortable and fit you well. Are closed at the toe. Do not wear sandals. If you use a stepladder: Make sure that it is fully opened. Do not climb a closed stepladder. Make sure that both sides of the stepladder are locked into place. Ask someone to hold it for you, if possible. Clearly mark and make sure that you can see: Any grab bars or handrails. First and last steps. Where the edge of each step is. Use tools that help you move around (mobility aids) if they are needed. These include: Canes. Walkers. Scooters. Crutches. Turn on the lights when you go into a dark area. Replace any light bulbs as soon as they burn out. Set up your furniture so you have a clear path. Avoid moving your furniture around. If any of your floors are uneven, fix them. If there are any pets around you, be aware of where they are. Review your medicines with your doctor. Some medicines can make you feel dizzy. This can increase your chance of falling. Ask your doctor what other things that you can do to help prevent falls. This information is not intended to replace advice given to you by your health care provider. Make sure you discuss any questions you have with your health care provider. Document Released: 06/12/2009 Document Revised: 01/22/2016 Document Reviewed: 09/20/2014 Elsevier Interactive Patient Education  2017 [employer].

## 2023-02-02 NOTE — Progress Notes (Signed)
I connected with  Clarnce Flock on 02/02/23 by a audio enabled telemedicine application and verified that I am speaking with the correct person using two identifiers.  Patient Location: Home  Provider Location: Home Office  I discussed the limitations of evaluation and management by telemedicine. The patient expressed understanding and agreed to proceed.  Subjective:   Jenny Mcfarland is a 72 y.o. female who presents for Medicare Annual (Subsequent) preventive examination.  Review of Systems      Cardiac Risk Factors include: advanced age (>46men, >30 women);sedentary lifestyle     Objective:    Today's Vitals   02/02/23 1007 02/02/23 1008  Weight: 155 lb (70.3 kg)   Height: 5' 4.5" (1.638 m)   PainSc:  1    Body mass index is 26.19 kg/m.     02/02/2023   10:22 AM 01/21/2022   11:18 AM 01/20/2021   11:12 AM 09/26/2020    8:52 AM 09/19/2020   12:13 PM 01/06/2020    9:18 AM 01/04/2020    2:42 PM  Advanced Directives  Does Patient Have a Medical Advance Directive? Yes Yes Yes Yes Yes Yes Yes  Type of Estate agent of [place];Living will Living will Healthcare Power of [place];Living will Healthcare Power of [employer] of [place];Living will Healthcare Power of [place];Living will  Does patient want to make changes to medical advance directive? No - Patient declined Yes (Inpatient - patient defers changing a medical advance directive and declines information at this time)  No - Patient declined  No - Patient declined   Copy of Healthcare Power of Attorney in Chart? Yes - validated most recent copy scanned in chart (See row information)  Yes - validated most recent copy scanned in chart (See row information) No - copy requested  Yes - validated most recent copy scanned in chart (See row information) Yes - validated most recent copy scanned in chart (See row information)    Current Medications (verified) Outpatient Encounter Medications  as of 02/02/2023  Medication Sig   BREO ELLIPTA 200-25 MCG/ACT AEPB Inhale 1 puff into the lungs daily.   Cholecalciferol (VITAMIN D3) 1000 units CAPS Take by mouth.   fexofenadine (ALLEGRA) 180 MG tablet Take 180 mg by mouth every morning.   levalbuterol (XOPENEX HFA) 45 MCG/ACT inhaler Inhale 1 puff into the lungs every 4 (four) hours as needed for wheezing.   omeprazole (PRILOSEC) 20 MG capsule Take 1 capsule (20 mg total) by mouth 2 (two) times daily before a meal.   Probiotic Product (ALIGN) 4 MG CAPS Take 1 capsule by mouth daily as needed (upset stomach).   vitamin B-12 (CYANOCOBALAMIN) 1000 MCG tablet Take 1,000 mcg by mouth daily.   zolpidem (AMBIEN) 5 MG tablet TAKE 1/2 TO 1 (ONE-HALF TO ONE) TABLET BY MOUTH AT BEDTIME   dicyclomine (BENTYL) 10 MG capsule Take 1 capsule (10 mg total) by mouth 3 (three) times daily as needed. (Patient not taking: Reported on 02/02/2023)   gabapentin (NEURONTIN) 300 MG capsule Take 300 mg by mouth 3 (three) times daily.   No facility-administered encounter medications on file as of 02/02/2023.    Allergies (verified) Levofloxacin and Tessalon [benzonatate]   History: Past Medical History:  Diagnosis Date   Allergic rhinitis    Arthritis    Asthma    WELL CONTROLLED   Barrett's esophagus 2005   Complication of anesthesia    GERD (gastroesophageal reflux disease) 11/2008   Hemorrhoids    Hiatal hernia  SMALL   History of chicken pox    IBS (irritable bowel syndrome)    Osteopenia    Peptic stricture of esophagus 11-2008   PONV (postoperative nausea and vomiting)    N/V WITH GALLBLADDER   Recurrent sinusitis    Past Surgical History:  Procedure Laterality Date   BREAST BIOPSY Right 07/26/2017   INTRADUCTAL PAPILLOMA.    BREAST BIOPSY Left 08/27/2020   affirm bx #1-"X" clip-path pending   BREAST BIOPSY Left 08/27/2020   affirm bx-#2-"coil: clip-path pending   BREAST BIOPSY Right 10/13/2021   [place] bx, heart marker, FIBROEPITHELIAL  PROLIFERATION WITH SCLEROSIS AND USUAL DUCTAL HYPERPLASIA   BREAST BIOPSY Right 10/29/2021   right stereo bx X clip - FIBROEPITHELIAL PROLIFERATION WITH SCLEROSIS   BREAST BIOPSY WITH RADIO FREQUENCY LOCALIZER Left 09/26/2020   Procedure: BREAST BIOPSY WITH RADIO FREQUENCY LOCALIZER;  Surgeon: Carolan Shiver, MD;  Location: [facility] ORS;  Service: General;  Laterality: Left;   BREAST LUMPECTOMY Right 08/2017   BREAST LUMPECTOMY WITH NEEDLE LOCALIZATION Right 09/09/2017   Procedure: BREAST LUMPECTOMY WITH NEEDLE LOCALIZATION;  Surgeon: Nadeen Landau, MD;  Location: [facility] ORS;  Service: General;  Laterality: Right;   CHOLECYSTECTOMY  2010   TUBAL LIGATION     Family History  Problem Relation Age of Onset   Aortic aneurysm Mother    Heart disease Mother    Heart attack Brother        early 60's   Arthritis Brother    Breast cancer Sister 14   Colon cancer Neg Hx    Social History   Socioeconomic History   Marital status: Divorced    Spouse name: Not on file   Number of children: 2   Years of education: Not on file   Highest education level: Not on file  Occupational History   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  Tobacco Use   Smoking status: Never   Smokeless tobacco: Never  Vaping Use   Vaping Use: Never used  Substance and Sexual Activity   Alcohol use: Yes    Alcohol/week: 0.0 standard drinks of alcohol    Comment: RARE   Drug use: No   Sexual activity: Not Currently  Other Topics Concern   Not on file  Social History Narrative   Daily caffeine use - soft drinks/tea       Separating from husband 1/17   Social Determinants of Health   Financial Resource Strain: Low Risk  (02/02/2023)   Overall Financial Resource Strain (CARDIA)    Difficulty of Paying Living Expenses: Not hard at all  Food Insecurity: No Food Insecurity (02/02/2023)   Hunger Vital Sign    Worried About Running Out of Food in the Last Year: Never true    Ran Out of Food in the Last  Year: Never true  Transportation Needs: No Transportation Needs (02/02/2023)   PRAPARE - Administrator, Civil Service (Medical): No    Lack of Transportation (Non-Medical): No  Physical Activity: Inactive (02/02/2023)   Exercise Vital Sign    Days of Exercise per Week: 0 days    Minutes of Exercise per Session: 0 min  Stress: No Stress Concern Present (02/02/2023)   [employer] of Occupational Health - Occupational Stress Questionnaire    Feeling of Stress : Not at all  Social Connections: Moderately Integrated (02/02/2023)   Social Connection and Isolation Panel [NHANES]    Frequency of Communication with Friends and Family: Three times a week    Frequency of  Social Gatherings with Friends and Family: Three times a week    Attends Religious Services: More than 4 times per year    Active Member of Clubs or Organizations: No    Attends Banker Meetings: Never    Marital Status: Married    Tobacco Counseling Counseling given: Not Answered   Clinical Intake:  Pre-visit preparation completed: Yes  Pain : 0-10 Pain Score: 1  Pain Type: Acute pain Pain Location: Generalized Pain Orientation: Left Pain Descriptors / Indicators: Aching, Burning Pain Onset: More than a month ago     Nutritional Risks: None, Nausea/ vomitting/ diarrhea Diabetes: No  How often do you need to have someone help you when you read instructions, pamphlets, or other written materials from your doctor or pharmacy?: 1 - Never  Diabetic? no  Interpreter Needed?: No  Information entered by :: C.Gio Janoski LPN   Activities of Daily Living    02/02/2023   10:23 AM 01/29/2023   11:18 AM  In your present state of health, do you have any difficulty performing the following activities:  Hearing? 0 1  Vision? 0 0  Difficulty concentrating or making decisions? 0 0  Walking or climbing stairs? 0 0  Dressing or bathing? 0 0  Doing errands, shopping? 0 0  Preparing Food and eating ? N  N  Using the Toilet? N N  In the past six months, have you accidently leaked urine? N Y  Do you have problems with loss of bowel control? N N  Managing your Medications? N N  Managing your Finances? N N  Housekeeping or managing your Housekeeping? N N    Patient Care Team: Tower, Audrie Gallus, MD as PCP - General (Family Medicine) Meryl Dare, MD as Consulting Physician (Gastroenterology)  Indicate any recent Medical Services you may have received from other than [facility] providers in the past year (date may be approximate).     Assessment:   This is a routine wellness examination for Jenny Mcfarland.  Hearing/Vision screen Hearing Screening - Comments:: Has hearing issues in left ear but can hear ok. Vision Screening - Comments:: Readers - [facility]  Dietary issues and exercise activities discussed: Current Exercise Habits: The patient does not participate in regular exercise at present, Exercise limited by: None identified   Goals Addressed             This Visit's Progress    Patient Stated       Lose weight       Depression Screen    02/02/2023   10:22 AM 09/15/2022    3:06 PM 02/01/2022    2:29 PM 01/21/2022   11:15 AM 05/27/2021    2:44 PM 01/20/2021   11:14 AM 01/04/2020    2:43 PM  PHQ 2/9 Scores  PHQ - 2 Score 0 0 0 0 0 0 0  PHQ- 9 Score  1    0 0    Fall Risk    02/02/2023   10:23 AM 01/29/2023   11:18 AM 09/15/2022    3:06 PM 02/01/2022    2:29 PM 01/21/2022   11:19 AM  Fall Risk   Falls in the past year? 0 0 0 0 0  Number falls in past yr: 0  0  0  Injury with Fall? 0  0  0  Risk for fall due to : No Fall Risks  No Fall Risks  No Fall Risks  Follow up Falls prevention discussed;Falls evaluation completed  Falls evaluation  completed Falls evaluation completed Falls evaluation completed    FALL RISK PREVENTION PERTAINING TO THE HOME:  Any stairs in or around the home? Yes  If so, are there any without handrails? No  Home free of loose throw rugs in walkways,  pet beds, electrical cords, etc? Yes  Adequate lighting in your home to reduce risk of falls? Yes   ASSISTIVE DEVICES UTILIZED TO PREVENT FALLS:  Life alert? No  Use of a cane, walker or w/c? No  Grab bars in the bathroom? No  Shower chair or bench in shower? No  Elevated toilet seat or a handicapped toilet? No    Cognitive Function:    01/20/2021   11:17 AM 01/04/2020    2:45 PM 01/01/2019   10:26 AM 12/22/2017   10:15 AM  MMSE - Mini Mental State Exam  Orientation to time 5 5 5 5   Orientation to Place 5 5 5 5   Registration 3 3 3 3   Attention/ Calculation 5 5 0 0  Recall 3 3 3 3   Language- name 2 objects   0 0  Language- repeat 1 1 1 1   Language- follow 3 step command   0 3  Language- read & follow direction   0 0  Write a sentence   0 0  Copy design   0 0  Total score   17 20        02/02/2023   10:23 AM 01/21/2022   11:21 AM  6CIT Screen  What Year? 0 points 0 points  What month? 0 points 0 points  What time? 0 points 0 points  Count back from 20 0 points 0 points  Months in reverse 0 points 0 points  Repeat phrase 0 points 0 points  Total Score 0 points 0 points    Immunizations Immunization History  Administered Date(s) Administered   Fluad Quad(high Dose 65+) 06/17/2022   Influenza Split 06/21/2012   Influenza,inj,Quad PF,6+ Mos 06/13/2013, 07/01/2014, 06/16/2015, 06/25/2016, 06/17/2017, 06/29/2018, 06/19/2019, 05/21/2020, 06/26/2021   PFIZER(Purple Top)SARS-COV-2 Vaccination 10/10/2019, 10/31/2019, 08/03/2020   Pneumococcal Conjugate-13 12/21/2016   Pneumococcal Polysaccharide-23 01/27/2021   Tdap 05/25/2011    TDAP status: Due, Education has been provided regarding the importance of this vaccine. Advised may receive this vaccine at local pharmacy or Health Dept. Aware to provide a copy of the vaccination record if obtained from local pharmacy or Health Dept. Verbalized acceptance and understanding.  Flu Vaccine status: Up to date  Pneumococcal vaccine  status: Up to date  Covid-19 vaccine status: Information provided on how to obtain vaccines.   Qualifies for Shingles Vaccine? Yes   Zostavax completed No   Shingrix Completed?: No.    Education has been provided regarding the importance of this vaccine. Patient has been advised to call insurance company to determine out of pocket expense if they have not yet received this vaccine. Advised may also receive vaccine at local pharmacy or Health Dept. Verbalized acceptance and understanding.  Screening Tests Health Maintenance  Topic Date Due   Hepatitis C Screening  Never done   Zoster Vaccines- Shingrix (1 of 2) Never done   Colonoscopy  03/12/2021   DTaP/Tdap/Td (2 - Td or Tdap) 05/24/2021   COVID-19 Vaccine (4 - 2023-24 season) 04/30/2022   INFLUENZA VACCINE  03/31/2023   MAMMOGRAM  10/06/2023   Medicare Annual Wellness (AWV)  02/02/2024   DEXA SCAN  03/31/2024   Pneumonia Vaccine 48+ Years old  Completed   HPV VACCINES  Aged Out  Health Maintenance  Health Maintenance Due  Topic Date Due   Hepatitis C Screening  Never done   Zoster Vaccines- Shingrix (1 of 2) Never done   Colonoscopy  03/12/2021   DTaP/Tdap/Td (2 - Td or Tdap) 05/24/2021   COVID-19 Vaccine (4 - 2023-24 season) 04/30/2022    Colorectal cancer screening: Type of screening: Colonoscopy. Completed 03/12/16. Repeat every 5 years pt has appointment with Dr.Stark  Mammogram status: Completed 10/05/22. Repeat every year  Bone Density status: Completed 03/31/22. Results reflect: Bone density results: OSTEOPENIA. Repeat every 2 years.  Lung Cancer Screening: (Low Dose CT Chest recommended if Age 89-80 years, 30 pack-year currently smoking OR have quit w/in 15years.) does not qualify.   Lung Cancer Screening Referral: no  Additional Screening:  Hepatitis C Screening: does qualify; Completed no DUE AT NEXT OV Vision Screening: Recommended annual ophthalmology exams for early detection of glaucoma and other  disorders of the eye. Is the patient up to date with their annual eye exam?  Yes  Who is the provider or what is the name of the office in which the patient attends annual eye exams? [facility] If pt is not established with a provider, would they like to be referred to a provider to establish care? No .   Dental Screening: Recommended annual dental exams for proper oral hygiene  Community Resource Referral / Chronic Care Management: CRR required this visit?  No   CCM required this visit?  No      Plan:     I have personally reviewed and noted the following in the patient's chart:   Medical and social history Use of alcohol, tobacco or illicit drugs  Current medications and supplements including opioid prescriptions. Patient is not currently taking opioid prescriptions. Functional ability and status Nutritional status Physical activity Advanced directives List of other physicians Hospitalizations, surgeries, and ER visits in previous 12 months Vitals Screenings to include cognitive, depression, and falls Referrals and appointments  In addition, I have reviewed and discussed with patient certain preventive protocols, quality metrics, and best practice recommendations. A written personalized care plan for preventive services as well as general preventive health recommendations were provided to patient.     Maryan Puls, LPN   09/04/1094   Nurse Notes: none

## 2023-02-08 ENCOUNTER — Other Ambulatory Visit: Payer: Medicare HMO

## 2023-02-15 ENCOUNTER — Ambulatory Visit (INDEPENDENT_AMBULATORY_CARE_PROVIDER_SITE_OTHER): Payer: Medicare HMO | Admitting: Family Medicine

## 2023-02-15 ENCOUNTER — Encounter: Payer: Self-pay | Admitting: Family Medicine

## 2023-02-15 VITALS — BP 131/60 | HR 55 | Temp 97.6°F | Ht 64.0 in | Wt 153.2 lb

## 2023-02-15 DIAGNOSIS — E559 Vitamin D deficiency, unspecified: Secondary | ICD-10-CM | POA: Diagnosis not present

## 2023-02-15 DIAGNOSIS — Z8249 Family history of ischemic heart disease and other diseases of the circulatory system: Secondary | ICD-10-CM | POA: Diagnosis not present

## 2023-02-15 DIAGNOSIS — K2270 Barrett's esophagus without dysplasia: Secondary | ICD-10-CM

## 2023-02-15 DIAGNOSIS — Z0000 Encounter for general adult medical examination without abnormal findings: Secondary | ICD-10-CM

## 2023-02-15 DIAGNOSIS — E785 Hyperlipidemia, unspecified: Secondary | ICD-10-CM | POA: Diagnosis not present

## 2023-02-15 DIAGNOSIS — D649 Anemia, unspecified: Secondary | ICD-10-CM | POA: Diagnosis not present

## 2023-02-15 DIAGNOSIS — Z79899 Other long term (current) drug therapy: Secondary | ICD-10-CM

## 2023-02-15 DIAGNOSIS — N6099 Unspecified benign mammary dysplasia of unspecified breast: Secondary | ICD-10-CM

## 2023-02-15 DIAGNOSIS — K2100 Gastro-esophageal reflux disease with esophagitis, without bleeding: Secondary | ICD-10-CM

## 2023-02-15 DIAGNOSIS — M8589 Other specified disorders of bone density and structure, multiple sites: Secondary | ICD-10-CM

## 2023-02-15 DIAGNOSIS — E538 Deficiency of other specified B group vitamins: Secondary | ICD-10-CM

## 2023-02-15 HISTORY — DX: Anemia, unspecified: D64.9

## 2023-02-15 NOTE — Assessment & Plan Note (Signed)
Continues specialty follow up every 6 mo

## 2023-02-15 NOTE — Assessment & Plan Note (Signed)
Omeprazole 20 mg bid  Encouraged to watch diet

## 2023-02-15 NOTE — Assessment & Plan Note (Signed)
Normocytic  Worse  Hb 10.8 No symptoms  Iron level of 56 Pt plans to d/w Dr Russella Dar at her follow up visit / due for colonoscopy  Normal renal fxn  Consider path smear review in future

## 2023-02-15 NOTE — Patient Instructions (Addendum)
You are due for a tetanus shot , you can check with your pharmacy    For cholesterol Avoid red meat/ fried foods/ egg yolks/ fatty breakfast meats/ butter, cheese and high fat dairy/ and shellfish   Let us know if you want a coronary calcium test  - see handout  It runs about 200$    Keep walking  On days that you don't work in yard  Add some strength training to your routine, this is important for bone and brain health and can reduce your risk of falls and help your body use insulin properly and regulate weight  Light weights, exercise bands , and internet videos are a good way to start  Yoga (chair or regular), machines , floor exercises or a gym with machines are also good options   Keep taking care of yourself   Talk to Dr Russella Dar about anemia as well and consider colonoscopy   Use sun protection  !

## 2023-02-15 NOTE — Assessment & Plan Note (Signed)
Reviewed health habits including diet and exercise and skin cancer prevention Reviewed appropriate screening tests for age  Also reviewed health mt list, fam hx and immunization status , as well as social and family history   See HPI Labs reviewed and ordered Pt plans to get Td at pharmacy  Mammogram utd 09/2022 Colonoscopy due-pt has appt to d/w GI in sept/ Dr Russella Dar Dexa 03/2022 no falls or fracture PHQ of 0

## 2023-02-15 NOTE — Assessment & Plan Note (Signed)
Considering cardiac calcium scan  Given handout  Will call for order if interested   Cholesterol is borderline/ HDL is good

## 2023-02-15 NOTE — Assessment & Plan Note (Signed)
Vitamin D level is therapeutic with current supplementation Disc importance of this to bone and overall health Last vitamin D Lab Results  Component Value Date   VD25OH 34.61 02/01/2023

## 2023-02-15 NOTE — Assessment & Plan Note (Signed)
Lab Results  Component Value Date   VITAMINB12 644 02/01/2023

## 2023-02-15 NOTE — Assessment & Plan Note (Signed)
Lab Results  Component Value Date   VITAMINB12 644 02/01/2023   Continue oral supplementation

## 2023-02-15 NOTE — Assessment & Plan Note (Signed)
Dexa 03/2022  No falls or fracture  D is therapeutic  Encouraged to add more strength building exercise

## 2023-02-15 NOTE — Assessment & Plan Note (Signed)
For GI follow up in fall No new symptoms On bid ppi

## 2023-02-15 NOTE — Progress Notes (Signed)
Subjective:    Patient ID: Jenny Mcfarland, female    DOB: [birth year], 72 y.o.   MRN: [id number]  HPI  Here for health maintenance exam and to review chronic medical problems   Wt Readings from Last 3 Encounters:  02/15/23 153 lb 4 oz (69.5 kg)  02/02/23 155 lb (70.3 kg)  09/15/22 155 lb (70.3 kg)   26.31 kg/m  Vitals:   02/15/23 1400 02/15/23 1431  BP: (!) 144/70 131/60  Pulse: (!) 55   Temp: 97.6 F (36.4 C)   SpO2: 99%    Doing some traveling this month  Doing well     Immunization History  Administered Date(s) Administered   Fluad Quad(high Dose 65+) 06/17/2022   Influenza Split 06/21/2012   Influenza,inj,Quad PF,6+ Mos 06/13/2013, 07/01/2014, 06/16/2015, 06/25/2016, 06/17/2017, 06/29/2018, 06/19/2019, 05/21/2020, 06/26/2021   PFIZER(Purple Top)SARS-COV-2 Vaccination 10/10/2019, 10/31/2019, 08/03/2020   Pneumococcal Conjugate-13 12/21/2016   Pneumococcal Polysaccharide-23 01/27/2021   Tdap 05/25/2011    Health Maintenance Due  Topic Date Due   Hepatitis C Screening  Never done   Colonoscopy  03/12/2021   DTaP/Tdap/Td (2 - Td or Tdap) 05/24/2021   Tetanus shot - interested outside of office   Dealing with sciatic nerve issues Goes to [facility] orth[facility]  Had prednisone, then gabapentin  Had MRI = some pinched nerves at L4L5 - had epidural in march / much better now   Mammogram 09/2022 Self breast exam- no new lumps  H/o lobular hyperplasia of breast (checks every 6 mo)  Also family history   Gyn care/ pap -no visits recently  No symptoms or problems    Colon cancer screening : colonoscopy 2017 with 5 y recall  Dr Russella Dar said the recall was changed to 7 years Unsure if she wants to do it  Will d/w him at her September visit    Dexa  03/2022  osteopenia  Falls: none  Fractures:none  Supplements  Last vitamin D Lab Results  Component Value Date   VD25OH 34.61 02/01/2023  Taking vit D - better with it   Exercise : housework /yard work Walking     Mood    02/02/2023   10:22 AM 09/15/2022    3:06 PM 02/01/2022    2:29 PM 01/21/2022   11:15 AM 05/27/2021    2:44 PM  Depression screen PHQ 2/9  Decreased Interest 0 0 0 0 0  Down, Depressed, Hopeless 0 0 0 0 0  PHQ - 2 Score 0 0 0 0 0  Altered sleeping  1     Tired, decreased energy  0     Change in appetite  0     Feeling bad or failure about yourself   0     Trouble concentrating  0     Moving slowly or fidgety/restless  0     Suicidal thoughts  0     PHQ-9 Score  1     Difficult doing work/chores  Not difficult at all       B12 def Lab Results  Component Value Date   VITAMINB12 644 02/01/2023   Hyperlipidemia Lab Results  Component Value Date   CHOL 207 (H) 02/01/2023   CHOL 199 01/22/2022   CHOL 207 (H) 01/19/2021   Lab Results  Component Value Date   HDL 77.70 02/01/2023   HDL 72.70 01/22/2022   HDL 73.10 01/19/2021   Lab Results  Component Value Date   LDLCALC 113 (H) 02/01/2023   LDLCALC 109 (H) 01/22/2022  LDLCALC 116 (H) 01/19/2021   Lab Results  Component Value Date   TRIG 80.0 02/01/2023   TRIG 88.0 01/22/2022   TRIG 88.0 01/19/2021   Lab Results  Component Value Date   CHOLHDL 3 02/01/2023   CHOLHDL 3 01/22/2022   CHOLHDL 3 01/19/2021   Lab Results  Component Value Date   LDLDIRECT 135.4 09/17/2013   LDLDIRECT 125.5 09/11/2012   LDLDIRECT 121.7 05/25/2011   Diet - was bad in spring and traveling Getting back to normal    Lab Results  Component Value Date   WBC 5.6 02/01/2023   HGB 10.8 (L) 02/01/2023   HCT 33.1 (L) 02/01/2023   MCV 88.1 02/01/2023   PLT 250.0 02/01/2023   Lab Results  Component Value Date   IRON 56 02/01/2023   Unsure why anemic On /off whole life  Feeling good  Normocytic    Lab Results  Component Value Date   TSH 1.34 02/01/2023      Patient Active Problem List   Diagnosis Date Noted   Anemia 02/15/2023   Abnormality on screening test 09/15/2022   Family history of early CAD 09/15/2022    Atypical lobular hyperplasia of breast 02/01/2022   Routine general medical examination at a health care facility 01/21/2022   Abnormal endometrial ultrasound 03/03/2021   Hearing loss 01/27/2021   Post-menopausal bleeding 01/27/2021   Vitamin B12 deficiency 01/19/2021   Current use of proton pump inhibitor 01/18/2021   Welcome to Medicare preventive visit 12/21/2016   Estrogen deficiency 06/04/2015   Vitamin D deficiency 09/23/2014   Thoracic or lumbosacral neuritis or radiculitis, unspecified 05/21/2013   Gynecological examination 05/25/2011   Encounter for general adult medical examination with abnormal findings 05/25/2011   Osteopenia 12/27/2010   IBS (irritable bowel syndrome) 12/25/2010   Allergic rhinitis 12/25/2010   Asthma 12/25/2010   Mild hyperlipidemia 12/25/2010   Family history of breast cancer 12/25/2010   Family history of aortic aneurysm 12/25/2010   GERD 11/08/2008   Barrett's esophagus 11/08/2008   Past Medical History:  Diagnosis Date   Allergic rhinitis    Allergy    Anemia 02/15/2023   Arthritis    Asthma    WELL CONTROLLED   Barrett's esophagus 2005   Complication of anesthesia    GERD (gastroesophageal reflux disease) 11/2008   Hemorrhoids    Hiatal hernia    SMALL   History of chicken pox    IBS (irritable bowel syndrome)    Osteopenia    Peptic stricture of esophagus 11/2008   PONV (postoperative nausea and vomiting)    N/V WITH GALLBLADDER   Recurrent sinusitis    Past Surgical History:  Procedure Laterality Date   BREAST BIOPSY Right 07/26/2017   INTRADUCTAL PAPILLOMA.    BREAST BIOPSY Left 08/27/2020   affirm bx #1-"X" clip-path pending   BREAST BIOPSY Left 08/27/2020   affirm bx-#2-"coil: clip-path pending   BREAST BIOPSY Right 10/13/2021   [place] bx, heart marker, FIBROEPITHELIAL PROLIFERATION WITH SCLEROSIS AND USUAL DUCTAL HYPERPLASIA   BREAST BIOPSY Right 10/29/2021   right stereo bx X clip - FIBROEPITHELIAL PROLIFERATION WITH  SCLEROSIS   BREAST BIOPSY WITH RADIO FREQUENCY LOCALIZER Left 09/26/2020   Procedure: BREAST BIOPSY WITH RADIO FREQUENCY LOCALIZER;  Surgeon: Carolan Shiver, MD;  Location: [facility] ORS;  Service: General;  Laterality: Left;   BREAST LUMPECTOMY Right 08/2017   BREAST LUMPECTOMY WITH NEEDLE LOCALIZATION Right 09/09/2017   Procedure: BREAST LUMPECTOMY WITH NEEDLE LOCALIZATION;  Surgeon: Nadeen Landau, MD;  Location:  [facility] ORS;  Service: General;  Laterality: Right;   CHOLECYSTECTOMY  2010   TUBAL LIGATION     Social History   Tobacco Use   Smoking status: Never   Smokeless tobacco: Never  Vaping Use   Vaping Use: Never used  Substance Use Topics   Alcohol use: Not Currently    Comment: maybe 2 or 3 drinks a year   Drug use: Never   Family History  Problem Relation Age of Onset   Aortic aneurysm Mother    Heart disease Mother    Heart attack Brother        early 47's   Arthritis Brother    Breast cancer Sister 48   Colon cancer Neg Hx    Allergies  Allergen Reactions   Levofloxacin Itching and Other (See Comments)    Felt like she had bugs crawling all over her.   Tessalon [Benzonatate] Nausea And Vomiting    Made GERD worse   Current Outpatient Medications on File Prior to Visit  Medication Sig Dispense Refill   BREO ELLIPTA 200-25 MCG/ACT AEPB Inhale 1 puff into the lungs daily.     Cholecalciferol (VITAMIN D3) 1000 units CAPS Take by mouth.     dicyclomine (BENTYL) 10 MG capsule Take 1 capsule (10 mg total) by mouth 3 (three) times daily as needed. 90 capsule 11   fexofenadine (ALLEGRA) 180 MG tablet Take 180 mg by mouth every morning.     gabapentin (NEURONTIN) 300 MG capsule Take 300 mg by mouth 2 (two) times daily.     levalbuterol (XOPENEX HFA) 45 MCG/ACT inhaler Inhale 1 puff into the lungs every 4 (four) hours as needed for wheezing.     omeprazole (PRILOSEC) 20 MG capsule Take 1 capsule (20 mg total) by mouth 2 (two) times daily before a meal. 60 capsule  11   Probiotic Product (ALIGN) 4 MG CAPS Take 1 capsule by mouth daily as needed (upset stomach).     vitamin B-12 (CYANOCOBALAMIN) 1000 MCG tablet Take 1,000 mcg by mouth daily.     zolpidem (AMBIEN) 5 MG tablet TAKE 1/2 TO 1 (ONE-HALF TO ONE) TABLET BY MOUTH AT BEDTIME 30 tablet 3   No current facility-administered medications on file prior to visit.    Review of Systems  Constitutional:  Negative for activity change, appetite change, fatigue, fever and unexpected weight change.  HENT:  Negative for congestion, ear pain, rhinorrhea, sinus pressure and sore throat.   Eyes:  Negative for pain, redness and visual disturbance.  Respiratory:  Negative for cough, shortness of breath and wheezing.   Cardiovascular:  Negative for chest pain and palpitations.  Gastrointestinal:  Negative for abdominal pain, blood in stool, constipation and diarrhea.  Endocrine: Negative for polydipsia and polyuria.  Genitourinary:  Negative for dysuria, frequency and urgency.  Musculoskeletal:  Positive for back pain. Negative for arthralgias and myalgias.  Skin:  Negative for pallor and rash.  Allergic/Immunologic: Negative for environmental allergies.  Neurological:  Negative for dizziness, syncope and headaches.  Hematological:  Negative for adenopathy. Does not bruise/bleed easily.  Psychiatric/Behavioral:  Negative for decreased concentration and dysphoric mood. The patient is not nervous/anxious.        Objective:   Physical Exam Constitutional:      General: She is not in acute distress.    Appearance: Normal appearance. She is well-developed and normal weight. She is not ill-appearing or diaphoretic.  HENT:     Head: Normocephalic and atraumatic.     Right  Ear: Tympanic membrane, ear canal and external ear normal.     Left Ear: Tympanic membrane, ear canal and external ear normal.     Nose: Nose normal. No congestion.     Mouth/Throat:     Mouth: Mucous membranes are moist.     Pharynx:  Oropharynx is clear. No posterior oropharyngeal erythema.  Eyes:     General: No scleral icterus.    Extraocular Movements: Extraocular movements intact.     Conjunctiva/sclera: Conjunctivae normal.     Pupils: Pupils are equal, round, and reactive to light.  Neck:     Thyroid: No thyromegaly.     Vascular: No carotid bruit or JVD.  Cardiovascular:     Rate and Rhythm: Normal rate and regular rhythm.     Pulses: Normal pulses.     Heart sounds: Normal heart sounds.     No gallop.  Pulmonary:     Effort: Pulmonary effort is normal. No respiratory distress.     Breath sounds: Normal breath sounds. No wheezing.     Comments: Good air exch Chest:     Chest wall: No tenderness.  Abdominal:     General: Bowel sounds are normal. There is no distension or abdominal bruit.     Palpations: Abdomen is soft. There is no mass.     Tenderness: There is no abdominal tenderness.     Hernia: No hernia is present.  Genitourinary:    Comments: Breast exam: No mass, nodules, thickening, tenderness, bulging, retraction, inflamation, nipple discharge or skin changes noted.  No axillary or clavicular LA.     Musculoskeletal:        General: No tenderness. Normal range of motion.     Cervical back: Normal range of motion and neck supple. No rigidity. No muscular tenderness.     Right lower leg: No edema.     Left lower leg: No edema.     Comments: No kyphosis   Lymphadenopathy:     Cervical: No cervical adenopathy.  Skin:    General: Skin is warm and dry.     Coloration: Skin is not pale.     Findings: No erythema or rash.     Comments: Tanned Solar lentigines diffusely   Neurological:     Mental Status: She is alert. Mental status is at baseline.     Cranial Nerves: No cranial nerve deficit.     Motor: No abnormal muscle tone.     Coordination: Coordination normal.     Gait: Gait normal.     Deep Tendon Reflexes: Reflexes are normal and symmetric. Reflexes normal.  Psychiatric:        Mood  and Affect: Mood normal.        Cognition and Memory: Cognition and memory normal.           Assessment & Plan:   Problem List Items Addressed This Visit       Digestive   GERD    Omeprazole 20 mg bid  Encouraged to watch diet       Barrett's esophagus    For GI follow up in fall No new symptoms On bid ppi        Musculoskeletal and Integument   Osteopenia    Dexa 03/2022  No falls or fracture  D is therapeutic  Encouraged to add more strength building exercise         Other   Vitamin D deficiency    Vitamin D level is therapeutic with current  supplementation Disc importance of this to bone and overall health Last vitamin D Lab Results  Component Value Date   VD25OH 34.61 02/01/2023        Vitamin B12 deficiency    Lab Results  Component Value Date   VITAMINB12 644 02/01/2023  Continue oral supplementation       Routine general medical examination at a health care facility - Primary    Reviewed health habits including diet and exercise and skin cancer prevention Reviewed appropriate screening tests for age  Also reviewed health mt list, fam hx and immunization status , as well as social and family history   See HPI Labs reviewed and ordered Pt plans to get Td at pharmacy  Mammogram utd 09/2022 Colonoscopy due-pt has appt to d/w GI in sept/ Dr Russella Dar Dexa 03/2022 no falls or fracture PHQ of 0       Mild hyperlipidemia    Last LDL was 113 with ratio of 3 Disc goals for lipids and reasons to control them Rev last labs with pt Rev low sat fat diet in detail   Disc consideration of cornary calcium score in the future in light of fam h/o vasc dz  If score was high would consider statin      Family history of early CAD    Considering cardiac calcium scan  Given handout  Will call for order if interested   Cholesterol is borderline/ HDL is good      Current use of proton pump inhibitor    Lab Results  Component Value Date   VITAMINB12 644  02/01/2023        Atypical lobular hyperplasia of breast    Continues specialty follow up every 6 mo        Anemia    Normocytic  Worse  Hb 10.8 No symptoms  Iron level of 56 Pt plans to d/w Dr Russella Dar at her follow up visit / due for colonoscopy  Normal renal fxn  Consider path smear review in future

## 2023-02-15 NOTE — Assessment & Plan Note (Signed)
Last LDL was 113 with ratio of 3 Disc goals for lipids and reasons to control them Rev last labs with pt Rev low sat fat diet in detail   Disc consideration of cornary calcium score in the future in light of fam h/o vasc dz  If score was high would consider statin

## 2023-02-16 ENCOUNTER — Other Ambulatory Visit: Payer: Self-pay | Admitting: Family Medicine

## 2023-02-16 DIAGNOSIS — R928 Other abnormal and inconclusive findings on diagnostic imaging of breast: Secondary | ICD-10-CM

## 2023-02-23 DIAGNOSIS — M5416 Radiculopathy, lumbar region: Secondary | ICD-10-CM | POA: Diagnosis not present

## 2023-02-23 DIAGNOSIS — M5136 Other intervertebral disc degeneration, lumbar region: Secondary | ICD-10-CM | POA: Diagnosis not present

## 2023-02-23 DIAGNOSIS — M48062 Spinal stenosis, lumbar region with neurogenic claudication: Secondary | ICD-10-CM | POA: Diagnosis not present

## 2023-03-07 DIAGNOSIS — H25013 Cortical age-related cataract, bilateral: Secondary | ICD-10-CM | POA: Diagnosis not present

## 2023-03-07 DIAGNOSIS — H5203 Hypermetropia, bilateral: Secondary | ICD-10-CM | POA: Diagnosis not present

## 2023-03-07 DIAGNOSIS — H40023 Open angle with borderline findings, high risk, bilateral: Secondary | ICD-10-CM | POA: Diagnosis not present

## 2023-03-07 DIAGNOSIS — Z83511 Family history of glaucoma: Secondary | ICD-10-CM | POA: Diagnosis not present

## 2023-03-07 DIAGNOSIS — H524 Presbyopia: Secondary | ICD-10-CM | POA: Diagnosis not present

## 2023-03-07 DIAGNOSIS — H2513 Age-related nuclear cataract, bilateral: Secondary | ICD-10-CM | POA: Diagnosis not present

## 2023-04-06 ENCOUNTER — Ambulatory Visit
Admission: RE | Admit: 2023-04-06 | Discharge: 2023-04-06 | Disposition: A | Discharge status: Home / Self Care | Payer: Medicare HMO | Source: Ambulatory Visit | Attending: Family Medicine | Admitting: Family Medicine

## 2023-04-06 DIAGNOSIS — R928 Other abnormal and inconclusive findings on diagnostic imaging of breast: Secondary | ICD-10-CM | POA: Diagnosis not present

## 2023-04-06 DIAGNOSIS — N6489 Other specified disorders of breast: Secondary | ICD-10-CM | POA: Diagnosis not present

## 2023-05-03 ENCOUNTER — Ambulatory Visit: Payer: Medicare HMO | Admitting: Gastroenterology

## 2023-05-03 ENCOUNTER — Encounter: Payer: Self-pay | Admitting: Gastroenterology

## 2023-05-03 ENCOUNTER — Other Ambulatory Visit (INDEPENDENT_AMBULATORY_CARE_PROVIDER_SITE_OTHER): Payer: Medicare HMO

## 2023-05-03 VITALS — BP 122/70 | HR 61 | Ht 64.0 in | Wt 155.0 lb

## 2023-05-03 DIAGNOSIS — D649 Anemia, unspecified: Secondary | ICD-10-CM

## 2023-05-03 DIAGNOSIS — Z86010 Personal history of colonic polyps: Secondary | ICD-10-CM

## 2023-05-03 DIAGNOSIS — K2100 Gastro-esophageal reflux disease with esophagitis, without bleeding: Secondary | ICD-10-CM | POA: Diagnosis not present

## 2023-05-03 LAB — CBC WITH DIFFERENTIAL/PLATELET
Basophils Absolute: 0 10*3/uL (ref 0.0–0.1)
Basophils Relative: 0.7 % (ref 0.0–3.0)
Eosinophils Absolute: 0.7 10*3/uL (ref 0.0–0.7)
Eosinophils Relative: 10.3 % — ABNORMAL HIGH (ref 0.0–5.0)
HCT: 35.7 % — ABNORMAL LOW (ref 36.0–46.0)
Hemoglobin: 11.7 g/dL — ABNORMAL LOW (ref 12.0–15.0)
Lymphocytes Relative: 25.9 % (ref 12.0–46.0)
Lymphs Abs: 1.7 10*3/uL (ref 0.7–4.0)
MCHC: 32.7 g/dL (ref 30.0–36.0)
MCV: 86.5 fl (ref 78.0–100.0)
Monocytes Absolute: 0.5 10*3/uL (ref 0.1–1.0)
Monocytes Relative: 7 % (ref 3.0–12.0)
Neutro Abs: 3.7 10*3/uL (ref 1.4–7.7)
Neutrophils Relative %: 56.1 % (ref 43.0–77.0)
Platelets: 244 10*3/uL (ref 150.0–400.0)
RBC: 4.13 Mil/uL (ref 3.87–5.11)
RDW: 13 % (ref 11.5–15.5)
WBC: 6.6 10*3/uL (ref 4.0–10.5)

## 2023-05-03 MED ORDER — NA SULFATE-K SULFATE-MG SULF 17.5-3.13-1.6 GM/177ML PO SOLN: 1.0000 | Freq: Once | ORAL | 0 refills | Status: AC

## 2023-05-03 NOTE — Patient Instructions (Signed)
Your provider has requested that you go to the basement level for lab work before leaving today. Press "B" on the elevator. The lab is located at the first door on the left as you exit the elevator.  You have been scheduled for an endoscopy and colonoscopy. Please follow the written instructions given to you at your visit today.  Please pick up your prep supplies at the pharmacy within the next 1-3 days.  If you use inhalers (even only as needed), please bring them with you on the day of your procedure.  DO NOT TAKE 7 DAYS PRIOR TO TEST- Trulicity (dulaglutide) Ozempic, Wegovy (semaglutide) Mounjaro (tirzepatide) Bydureon Bcise (exanatide extended release)  DO NOT TAKE 1 DAY PRIOR TO YOUR TEST Rybelsus (semaglutide) Adlyxin (lixisenatide) Victoza (liraglutide) Byetta (exanatide) ___________________________________________________________________________  The [facility] providers would like to encourage you to use [facility] [place] to communicate with providers for non-urgent requests or questions.  Due to long hold times on the telephone, sending your provider a message by [facility] may be a faster and more efficient way to get a response.  Please allow 48 business hours for a response.  Please remember that this is for non-urgent requests.   Due to recent changes in healthcare laws, you may see the results of your imaging and laboratory studies on MyChart before your provider has had a chance to review them.  We understand that in some cases there may be results that are confusing or concerning to you. Not all laboratory results come back in the same time frame and the provider may be waiting for multiple results in order to interpret others.  Please give [place] 48 hours in order for your provider to thoroughly review all the results before contacting the office for clarification of your results.   Thank you for choosing me and  Gastroenterology.  Venita Lick. Pleas Koch., MD., Clement[facility] Graham

## 2023-05-03 NOTE — Progress Notes (Signed)
Assessment     GERD with LA Class B esophagitis IBS, alternating pattern  Personal history of adenomatous colon polyps due for surveillance Normocytic anemia B12 deficiency    Recommendations    Continue omeprazole 20 mg po bid Continue dicyclomine 10 mg po tid prn CBC, Fe, TIBC, ferritin, folate, tTG, IgA Schedule colonoscopy and EGD. The risks (including bleeding, perforation, infection, missed lesions, medication reactions and possible hospitalization or surgery if complications occur), benefits, and alternatives to colonoscopy with possible biopsy and possible polypectomy were discussed with the patient and they consent to proceed.  The risks (including bleeding, perforation, infection, missed lesions, medication reactions and possible hospitalization or surgery if complications occur), benefits, and alternatives to endoscopy with possible biopsy and possible dilation were discussed with the patient and they consent to proceed.     HPI    This is a 72 year old female returning for follow-up of GERD, IBS-A, personal history of adenomatous colon polyps and recently diagnosed normocytic anemia.  She has known B12 deficiency however her B12 levels have been normal since starting replacement.  She had significant sciatic nerve pain earlier this year and was taking Advil frequently until about 3 months ago.  She received lumbar spine injection and her sciatic symptoms have resolved.  CBC in June showed a hemoglobin of 10.8 and iron was 56.  Her bowel pattern continues an alternating pattern as it has for years.  Her reflux symptoms are under very good control on her current regimen.  She has a history of Barrett's esophagus however Barrett's was not noted on her last EGD in 2017.  Denies weight loss, abdominal pain, change in stool caliber, melena, hematochezia, nausea, vomiting, dysphagia, chest pain.    Labs / Imaging       Latest Ref Rng & Units 02/01/2023    2:04 PM 01/22/2022    10:33 AM 01/19/2021   10:11 AM  Hepatic Function  Total Protein 6.0 - 8.3 g/dL 6.9  6.5  6.5   Albumin 3.5 - 5.2 g/dL 4.2  4.0  4.0   AST 0 - 37 U/L 17  11  12    ALT 0 - 35 U/L 14  8  11    Alk Phosphatase 39 - 117 U/L 61  69  75   Total Bilirubin 0.2 - 1.2 mg/dL 0.7  1.0  0.9        Latest Ref Rng & Units 02/01/2023    2:04 PM 01/22/2022   10:33 AM 01/19/2021   10:11 AM  CBC  WBC 4.0 - 10.5 K/uL 5.6  5.8  5.6   Hemoglobin 12.0 - 15.0 g/dL 16.1  09.6  04.5   Hematocrit 36.0 - 46.0 % 33.1  37.1  35.6   Platelets 150.0 - 400.0 K/uL 250.0  223.0  232.0    Current Medications, Allergies, Past Medical History, Past Surgical History, Family History and Social History were reviewed in [employer] record.   Physical Exam: General: Well developed, well nourished, no acute distress Head: Normocephalic and atraumatic Eyes: Sclerae anicteric, EOMI Ears: Normal auditory acuity Mouth: No deformities or lesions noted Lungs: Clear throughout to auscultation Heart: Regular rate and rhythm; No murmurs, rubs or bruits Abdomen: Soft, non tender and non distended. No masses, hepatosplenomegaly or hernias noted. Normal Bowel sounds Rectal: Deferred to colonoscopy  Musculoskeletal: Symmetrical with no gross deformities  Pulses:  Normal pulses noted Extremities: No edema or deformities noted Neurological: Alert oriented x 4, grossly nonfocal Psychological:  Alert and cooperative. Normal mood and affect   [place] Dowd T. Russella Dar, MD 05/03/2023, 3:08 PM

## 2023-05-04 LAB — FOLATE: Folate: 17.3 ng/mL (ref >5.9)

## 2023-05-04 LAB — IBC + FERRITIN
Ferritin: 15.1 ng/mL (ref 10.0–291.0)
Iron: 52 ug/dL (ref 42–145)
Saturation Ratios: 13.1 % — ABNORMAL LOW (ref 20.0–50.0)
TIBC: 396.2 ug/dL (ref 250.0–450.0)
Transferrin: 283 mg/dL (ref 212.0–360.0)

## 2023-05-04 LAB — TISSUE TRANSGLUTAMINASE, IGA: (tTG) Ab, IgA: <1 U/mL

## 2023-05-04 LAB — IGA: Immunoglobulin A: 211 mg/dL (ref 70–320)

## 2023-05-23 ENCOUNTER — Other Ambulatory Visit: Payer: Self-pay | Admitting: Family Medicine

## 2023-05-23 DIAGNOSIS — M5136 Other intervertebral disc degeneration, lumbar region: Secondary | ICD-10-CM | POA: Diagnosis not present

## 2023-05-23 DIAGNOSIS — M5416 Radiculopathy, lumbar region: Secondary | ICD-10-CM | POA: Diagnosis not present

## 2023-05-24 NOTE — Telephone Encounter (Signed)
Name of Medication: Ambien Name of Pharmacy: [facility] [address] Last Fill or Written Date and Quantity: 12/03/22 #30 tabs/ 3 refills Last Office Visit and Type: CPE on 02/15/23 Next Office Visit and Type: none scheduled

## 2023-05-26 DIAGNOSIS — J301 Allergic rhinitis due to pollen: Secondary | ICD-10-CM | POA: Diagnosis not present

## 2023-05-26 DIAGNOSIS — H1045 Other chronic allergic conjunctivitis: Secondary | ICD-10-CM | POA: Diagnosis not present

## 2023-05-26 DIAGNOSIS — J4530 Mild persistent asthma, uncomplicated: Secondary | ICD-10-CM | POA: Diagnosis not present

## 2023-05-26 DIAGNOSIS — J3089 Other allergic rhinitis: Secondary | ICD-10-CM | POA: Diagnosis not present

## 2023-06-02 ENCOUNTER — Telehealth: Payer: Self-pay | Admitting: Gastroenterology

## 2023-06-02 NOTE — Telephone Encounter (Signed)
Error

## 2023-06-06 ENCOUNTER — Encounter: Payer: Self-pay | Admitting: Gastroenterology

## 2023-06-16 ENCOUNTER — Ambulatory Visit: Payer: Medicare HMO | Admitting: Gastroenterology

## 2023-06-16 ENCOUNTER — Telehealth: Payer: Self-pay | Admitting: Gastroenterology

## 2023-06-16 ENCOUNTER — Encounter: Payer: Self-pay | Admitting: Gastroenterology

## 2023-06-16 VITALS — BP 128/68 | HR 58 | Temp 97.5°F | Resp 15 | Ht 64.0 in | Wt 155.0 lb

## 2023-06-16 DIAGNOSIS — K582 Mixed irritable bowel syndrome: Secondary | ICD-10-CM | POA: Diagnosis not present

## 2023-06-16 DIAGNOSIS — K222 Esophageal obstruction: Secondary | ICD-10-CM

## 2023-06-16 DIAGNOSIS — Z09 Encounter for follow-up examination after completed treatment for conditions other than malignant neoplasm: Secondary | ICD-10-CM

## 2023-06-16 DIAGNOSIS — D509 Iron deficiency anemia, unspecified: Secondary | ICD-10-CM

## 2023-06-16 DIAGNOSIS — K317 Polyp of stomach and duodenum: Secondary | ICD-10-CM | POA: Diagnosis not present

## 2023-06-16 DIAGNOSIS — K2100 Gastro-esophageal reflux disease with esophagitis, without bleeding: Secondary | ICD-10-CM | POA: Diagnosis not present

## 2023-06-16 DIAGNOSIS — Z860101 Personal history of adenomatous and serrated colon polyps: Secondary | ICD-10-CM | POA: Diagnosis not present

## 2023-06-16 MED ORDER — POLYSACCHARIDE IRON COMPLEX 150 MG PO CAPS: 150.0000 mg | ORAL_CAPSULE | Freq: Two times a day (BID) | ORAL | 2 refills | Status: DC

## 2023-06-16 MED ORDER — OMEPRAZOLE 40 MG PO CPDR: DELAYED_RELEASE_CAPSULE | ORAL | 9 refills | Status: DC

## 2023-06-16 MED ORDER — SODIUM CHLORIDE 0.9 % IV SOLN: 500.0000 mL | Freq: Once | INTRAVENOUS | Status: DC

## 2023-06-16 NOTE — Patient Instructions (Addendum)
- Follow antireflux measures. - Continue present medications. - Increase omeprazole to 40 mg po bid for 2 months then resume 20 mg po bid, refills for 1 year. - Change to NuIron 150 one po bid with food, #60, 2 refills - Await pathology results. - Return to PCP for mgmt of iron deficiency anemia.  - No repeat colonoscopy due to age and the absence of colonic polyps.  YOU HAD AN ENDOSCOPIC PROCEDURE TODAY AT THE [facility]:   Refer to the procedure report that was given to you for any specific questions about what was found during the examination.  If the procedure report does not answer your questions, please call your gastroenterologist to clarify.  If you requested that your care partner not be given the details of your procedure findings, then the procedure report has been included in a sealed envelope for you to review at your convenience later.  YOU SHOULD EXPECT: Some feelings of bloating in the abdomen. Passage of more gas than usual.  Walking can help get rid of the air that was put into your GI tract during the procedure and reduce the bloating. If you had a lower endoscopy (such as a colonoscopy or flexible sigmoidoscopy) you may notice spotting of blood in your stool or on the toilet paper. If you underwent a bowel prep for your procedure, you may not have a normal bowel movement for a few days.  Please Note:  You might notice some irritation and congestion in your nose or some drainage.  This is from the oxygen used during your procedure.  There is no need for concern and it should clear up in a day or so.  SYMPTOMS TO REPORT IMMEDIATELY:  Following lower endoscopy (colonoscopy or flexible sigmoidoscopy):  Excessive amounts of blood in the stool  Significant tenderness or worsening of abdominal pains  Swelling of the abdomen that is new, acute  Fever of 100F or higher  Following upper endoscopy (EGD)  Vomiting of blood or coffee ground material  New chest pain or  pain under the shoulder blades  Painful or persistently difficult swallowing  New shortness of breath  Fever of 100F or higher  Black, tarry-looking stools  For urgent or emergent issues, a gastroenterologist can be reached at any hour by calling [phone]. Do not use MyChart messaging for urgent concerns.    DIET:  We do recommend a small meal at first, but then you may proceed to your regular diet.  Drink plenty of fluids but you should avoid alcoholic beverages for 24 hours.  ACTIVITY:  You should plan to take it easy for the rest of today and you should NOT DRIVE or use heavy machinery until tomorrow (because of the sedation medicines used during the test).    FOLLOW UP: Our staff will call the number listed on your records the next business day following your procedure.  We will call around 7:15- 8:00 am to check on you and address any questions or concerns that you may have regarding the information given to you following your procedure. If we do not reach you, we will leave a message.     If any biopsies were taken you will be contacted by phone or by letter within the next 1-3 weeks.  Please call us at [phone] if you have not heard about the biopsies in 3 weeks.    SIGNATURES/CONFIDENTIALITY: You and/or your care partner have signed paperwork which will be entered into your electronic medical  record.  These signatures attest to the fact that that the information above on your After Visit Summary has been reviewed and is understood.  Full responsibility of the confidentiality of this discharge information lies with you and/or your care-partner.

## 2023-06-16 NOTE — Op Note (Addendum)
[facility] Patient Name: Jenny Mcfarland Procedure Date: 06/16/2023 1:46 PM MRN: [id number] Endoscopist: Meryl Dare , MD, [phone] Age: 72 Referring MD:  Date of Birth: [birth year] Gender: Female Account #: [id number] Procedure:                Colonoscopy Indications:              Surveillance: Personal history of adenomatous                            polyps on last colonoscopy > 5 years ago, Iron                            deficiency anemia Medicines:                Monitored Anesthesia Care Procedure:                Pre-Anesthesia Assessment:                           - Prior to the procedure, a History and Physical                            was performed, and patient medications and                            allergies were reviewed. The patient's tolerance of                            previous anesthesia was also reviewed. The risks                            and benefits of the procedure and the sedation                            options and risks were discussed with the patient.                            All questions were answered, and informed consent                            was obtained. Prior Anticoagulants: The patient has                            taken no anticoagulant or antiplatelet agents. ASA                            Grade Assessment: II - A patient with mild systemic                            disease. After reviewing the risks and benefits,                            the patient was deemed in satisfactory condition to  undergo the procedure.                           After obtaining informed consent, the colonoscope                            was passed under direct vision. Throughout the                            procedure, the patient's blood pressure, pulse, and                            oxygen saturations were monitored continuously. The                            Olympus Scope SN: [id number] was introduced  through                            the anus and advanced to the the cecum, identified                            by appendiceal orifice and ileocecal valve. The                            ileocecal valve, appendiceal orifice, and rectum                            were photographed. The quality of the bowel                            preparation was good. The colonoscopy was performed                            without difficulty. The patient tolerated the                            procedure well. Scope In: 1:54:11 PM Scope Out: 2:07:42 PM Scope Withdrawal Time: 0 hours 10 minutes 5 seconds  Total Procedure Duration: 0 hours 13 minutes 31 seconds  Findings:                 The perianal and digital rectal examinations were                            normal.                           External hemorrhoids were found during                            retroflexion. The hemorrhoids were small.                           The exam was otherwise without abnormality on  direct and retroflexion views. Complications:            No immediate complications. Estimated blood loss:                            None. Estimated Blood Loss:     Estimated blood loss: none. Impression:               - External hemorrhoids.                           - The examination was otherwise normal on direct                            and retroflexion views.                           - No specimens collected. Recommendation:           - Patient has a contact number available for                            emergencies. The signs and symptoms of potential                            delayed complications were discussed with the                            patient. Return to normal activities tomorrow.                            Written discharge instructions were provided to the                            patient.                           - Resume previous diet.                           - Continue  present medications.                           - No repeat colonoscopy due to age and the absence                            of colonic polyps. Meryl Dare, MD 06/16/2023 2:10:49 PM This report has been signed electronically.

## 2023-06-16 NOTE — Progress Notes (Signed)
Called to room to assist during endoscopic procedure.  Patient ID and intended procedure confirmed with present staff. Received instructions for my participation in the procedure from the performing physician.  

## 2023-06-16 NOTE — Telephone Encounter (Signed)
Returned the patient's phone call. Told her this was fine and will notify the front desk that she will be few minutes late.

## 2023-06-16 NOTE — Progress Notes (Signed)
History & Physical  Primary Care Physician:  Tower, Audrie Gallus, MD Primary Gastroenterologist: Claudette Head, MD  Impression / Plan: GERD, personal history of adenomatous colon polyps and recently diagnosed normocytic anemia for colonoscopy and EGD.  CHIEF COMPLAINT: GERD, anemia, Personal history of colon polyps   HPI: Jenny Mcfarland is a 72 y.o. female with GERD, personal history of adenomatous colon polyps and recently diagnosed normocytic anemia for colonoscopy and EGD.    Past Medical History:  Diagnosis Date   Allergic rhinitis    Allergy    Anemia 02/15/2023   Arthritis    Asthma    WELL CONTROLLED   Barrett's esophagus 2005   Complication of anesthesia    GERD (gastroesophageal reflux disease) 11/2008   Hemorrhoids    Hiatal hernia    SMALL   History of chicken pox    IBS (irritable bowel syndrome)    Osteopenia    Peptic stricture of esophagus 11/2008   PONV (postoperative nausea and vomiting)    N/V WITH GALLBLADDER   Recurrent sinusitis     Past Surgical History:  Procedure Laterality Date   BREAST BIOPSY Right 07/26/2017   INTRADUCTAL PAPILLOMA.    BREAST BIOPSY Left 08/27/2020   affirm bx #1-"X" clip-   BREAST BIOPSY Left 08/27/2020   affirm bx-#2-"coil: clip-   BREAST BIOPSY Right 10/13/2021   [place] bx, heart marker, FIBROEPITHELIAL PROLIFERATION WITH SCLEROSIS AND USUAL DUCTAL HYPERPLASIA   BREAST BIOPSY Right 10/29/2021   right stereo bx X clip - FIBROEPITHELIAL PROLIFERATION WITH SCLEROSIS   BREAST BIOPSY WITH RADIO FREQUENCY LOCALIZER Left 09/26/2020   Procedure: BREAST BIOPSY WITH RADIO FREQUENCY LOCALIZER;  Surgeon: Carolan Shiver, MD;  Location: [facility] ORS;  Service: General;  Laterality: Left;   BREAST LUMPECTOMY Right 08/2017   BREAST LUMPECTOMY WITH NEEDLE LOCALIZATION Right 09/09/2017   Procedure: BREAST LUMPECTOMY WITH NEEDLE LOCALIZATION;  Surgeon: Nadeen Landau, MD;  Location: [facility] ORS;  Service: General;  Laterality:  Right;   CHOLECYSTECTOMY  2010   TUBAL LIGATION      Prior to Admission medications   Medication Sig Start Date End Date Taking? Authorizing Provider  BREO ELLIPTA 200-25 MCG/ACT AEPB Inhale 1 puff into the lungs daily. 09/09/22  Yes [provider]  Cholecalciferol (VITAMIN D3) 1000 units CAPS Take by mouth.   Yes [provider]  fexofenadine (ALLEGRA) 180 MG tablet Take 180 mg by mouth every morning.   Yes [provider]  omeprazole (PRILOSEC) 20 MG capsule Take 1 capsule (20 mg total) by mouth 2 (two) times daily before a meal. 04/01/20  Yes Meryl Dare, MD  vitamin B-12 (CYANOCOBALAMIN) 1000 MCG tablet Take 1,000 mcg by mouth daily.   Yes [provider]  zolpidem (AMBIEN) 5 MG tablet TAKE 1/2 TO 1 (ONE-HALF TO ONE) TABLET BY MOUTH AT BEDTIME 05/24/23  Yes Tower, Audrie Gallus, MD  dicyclomine (BENTYL) 10 MG capsule Take 1 capsule (10 mg total) by mouth 3 (three) times daily as needed. Patient not taking: Reported on 06/16/2023 04/26/22   Meryl Dare, MD  levalbuterol [facility] HFA) 45 MCG/ACT inhaler Inhale 1 puff into the lungs every 4 (four) hours as needed for wheezing. Patient not taking: Reported on 06/16/2023    [provider]  Probiotic Product (ALIGN) 4 MG CAPS Take 1 capsule by mouth daily as needed (upset stomach). Patient not taking: Reported on 06/16/2023    [provider]    Current Outpatient Medications  Medication Sig Dispense Refill  BREO ELLIPTA 200-25 MCG/ACT AEPB Inhale 1 puff into the lungs daily.     Cholecalciferol (VITAMIN D3) 1000 units CAPS Take by mouth.     fexofenadine (ALLEGRA) 180 MG tablet Take 180 mg by mouth every morning.     omeprazole (PRILOSEC) 20 MG capsule Take 1 capsule (20 mg total) by mouth 2 (two) times daily before a meal. 60 capsule 11   vitamin B-12 (CYANOCOBALAMIN) 1000 MCG tablet Take 1,000 mcg by mouth daily.     zolpidem (AMBIEN) 5 MG tablet TAKE 1/2 TO 1 (ONE-HALF TO ONE)  TABLET BY MOUTH AT BEDTIME 30 tablet 3   dicyclomine (BENTYL) 10 MG capsule Take 1 capsule (10 mg total) by mouth 3 (three) times daily as needed. (Patient not taking: Reported on 06/16/2023) 90 capsule 11   levalbuterol (XOPENEX HFA) 45 MCG/ACT inhaler Inhale 1 puff into the lungs every 4 (four) hours as needed for wheezing. (Patient not taking: Reported on 06/16/2023)     Probiotic Product (ALIGN) 4 MG CAPS Take 1 capsule by mouth daily as needed (upset stomach). (Patient not taking: Reported on 06/16/2023)     Current Facility-Administered Medications  Medication Dose Route Frequency Provider Last Rate Last Admin   0.9 %  sodium chloride infusion  500 mL Intravenous Once Meryl Dare, MD        Allergies as of 06/16/2023 - Review Complete 06/16/2023  Allergen Reaction Noted   Levofloxacin Itching and Other (See Comments)    Tessalon [benzonatate] Nausea And Vomiting 12/27/2017    Family History  Problem Relation Age of Onset   Aortic aneurysm Mother    Heart disease Mother    Heart attack Brother        early 47's   Arthritis Brother    Breast cancer Sister 5   Colon cancer Neg Hx     Social History   Socioeconomic History   Marital status: Divorced    Spouse name: Not on file   Number of children: 2   Years of education: Not on file   Highest education level: Not on file  Occupational History   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  Tobacco Use   Smoking status: Never   Smokeless tobacco: Never  Vaping Use   Vaping status: Never Used  Substance and Sexual Activity   Alcohol use: Not Currently    Comment: maybe 2 or 3 drinks a year   Drug use: Never   Sexual activity: Not Currently  Other Topics Concern   Not on file  Social History Narrative   Daily caffeine use - soft drinks/tea       Separating from husband 1/17   Social Determinants of Health   Financial Resource Strain: Low Risk  (02/02/2023)   Overall Financial Resource Strain (CARDIA)     Difficulty of Paying Living Expenses: Not hard at all  Food Insecurity: No Food Insecurity (02/02/2023)   Hunger Vital Sign    Worried About Running Out of Food in the Last Year: Never true    Ran Out of Food in the Last Year: Never true  Transportation Needs: No Transportation Needs (02/02/2023)   PRAPARE - Administrator, Civil Service (Medical): No    Lack of Transportation (Non-Medical): No  Physical Activity: Inactive (02/02/2023)   Exercise Vital Sign    Days of Exercise per Week: 0 days    Minutes of Exercise per Session: 0 min  Stress: No Stress Concern Present (02/02/2023)   [employer] of  Occupational Health - Occupational Stress Questionnaire    Feeling of Stress : Not at all  Social Connections: Moderately Integrated (02/02/2023)   Social Connection and Isolation Panel [NHANES]    Frequency of Communication with Friends and Family: Three times a week    Frequency of Social Gatherings with Friends and Family: Three times a week    Attends Religious Services: More than 4 times per year    Active Member of Clubs or Organizations: No    Attends Banker Meetings: Never    Marital Status: Married  Catering manager Violence: Not At Risk (02/02/2023)   Humiliation, Afraid, Rape, and Kick questionnaire    Fear of Current or Ex-Partner: No    Emotionally Abused: No    Physically Abused: No    Sexually Abused: No    Review of Systems:  All systems reviewed were negative except where noted in HPI.   Physical Exam:  General:  Alert, well-developed, in NAD Head:  Normocephalic and atraumatic. Eyes:  Sclera clear, no icterus.   Conjunctiva pink. Ears:  Normal auditory acuity. Mouth:  No deformity or lesions.  Neck:  Supple; no masses. Lungs:  Clear throughout to auscultation.   No wheezes, crackles, or rhonchi.  Heart:  Regular rate and rhythm; no murmurs. Abdomen:  Soft, nondistended, nontender. No masses, hepatomegaly. No palpable masses.  Normal bowel  sounds.    Rectal:  Deferred   Msk:  Symmetrical without gross deformities. Extremities:  Without edema. Neurologic:  Alert and  oriented x 4; grossly normal neurologically. Skin:  Intact without significant lesions or rashes. Psych:  Alert and cooperative. Normal mood and affect.   Venita Lick. Russella Dar  06/16/2023, 1:44 PM See Loretha Stapler, [facility], to contact our on call provider

## 2023-06-16 NOTE — Progress Notes (Signed)
Vss nad trans to pacu 

## 2023-06-16 NOTE — Progress Notes (Signed)
Pt's states no medical or surgical changes since previsit or office visit. 

## 2023-06-16 NOTE — Telephone Encounter (Signed)
Inbound call from patient stating she is scheduled for a colonoscopy for today at 2:00 with Dr/. Russella Dar and her daughter is going to be her caregiver. Patient stated that her daughter is not going to be able to get off as early as she wanted and she may be here 5 to 10 minutes after 1.

## 2023-06-16 NOTE — Op Note (Addendum)
[facility] Patient Name: Jenny Mcfarland Procedure Date: 06/16/2023 1:40 PM MRN: [id number] Endoscopist: Meryl Dare , MD, [phone] Age: 72 Referring MD:  Date of Birth: 1951-03-10 Gender: Female Account #: [id number] Procedure:                Upper GI endoscopy Indications:              Unexplained iron deficiency anemia,                            Gastroesophageal reflux disease Medicines:                Monitored Anesthesia Care Procedure:                Pre-Anesthesia Assessment:                           - Prior to the procedure, a History and Physical                            was performed, and patient medications and                            allergies were reviewed. The patient's tolerance of                            previous anesthesia was also reviewed. The risks                            and benefits of the procedure and the sedation                            options and risks were discussed with the patient.                            All questions were answered, and informed consent                            was obtained. Prior Anticoagulants: The patient has                            taken no anticoagulant or antiplatelet agents. ASA                            Grade Assessment: II - A patient with mild systemic                            disease. After reviewing the risks and benefits,                            the patient was deemed in satisfactory condition to                            undergo the procedure.  After obtaining informed consent, the endoscope was                            passed under direct vision. Throughout the                            procedure, the patient's blood pressure, pulse, and                            oxygen saturations were monitored continuously. The                            GIF [id number] #[id number] was introduced through the                            mouth, and advanced to the second  part of duodenum.                            The upper GI endoscopy was accomplished without                            difficulty. The patient tolerated the procedure                            well. Scope In: Scope Out: Findings:                 LA Grade A (one or more mucosal breaks less than 5                            mm, not extending between tops of 2 mucosal folds)                            esophagitis with no bleeding was found at the                            gastroesophageal junction.                           One benign-appearing, intrinsic mild stenosis was                            found at the gastroesophageal junction. This                            stenosis measured 1.3 cm (inner diameter) x less                            than one cm (in length). The stenosis was traversed.                           The exam of the esophagus was otherwise normal.  A medium-sized hiatal hernia was present.                           The gastroesophageal flap valve was visualized                            endoscopically and classified as Hill Grade IV (no                            fold, wide open lumen, hiatal hernia present).                           Multiple 4 to 8 mm sessile polyps with no bleeding                            and no stigmata of recent bleeding were found in                            the gastric fundus and in the gastric body.                            Sampling biopsies were taken with a cold forceps                            for histology.                           The exam of the stomach was otherwise normal.                           The duodenal bulb and second portion of the                            duodenum were normal. Biopsies were taken with a                            cold forceps for histology. Biopsies for histology                            were taken with a cold forceps for evaluation of                             celiac disease. Complications:            No immediate complications. Estimated Blood Loss:     Estimated blood loss was minimal. Impression:               - LA Grade A reflux esophagitis with no bleeding.                           - Benign-appearing esophageal stenosis.                           - Medium-sized hiatal hernia.                           -  Gastroesophageal flap valve classified as Hill                            Grade IV (no fold, wide open lumen, hiatal hernia                            present).                           - Multiple gastric polyps. Biopsied.                           - Normal duodenal bulb and second portion of the                            duodenum. Biopsied. Recommendation:           - Patient has a contact number available for                            emergencies. The signs and symptoms of potential                            delayed complications were discussed with the                            patient. Return to normal activities tomorrow.                            Written discharge instructions were provided to the                            patient.                           - Resume previous diet.                           - Follow antireflux measures.                           - Continue present medications.                           - Increase omeprazole to 40 mg po bid for 2 months                            then resume 20 mg po bid, refills for 1 year.                           - Change to NuIron 150 one po bid with food, #60, 2                            refills                           -  Await pathology results.                           - Return to PCP for mgmt of iron deficiency anemia. Meryl Dare, MD 06/16/2023 2:25:37 PM This report has been signed electronically.

## 2023-06-17 ENCOUNTER — Telehealth: Payer: Self-pay | Admitting: *Deleted

## 2023-06-17 NOTE — Telephone Encounter (Signed)
  Follow up Call-     06/16/2023    1:29 PM  Call back number  Post procedure Call Back phone  # [phone]  Permission to leave phone message Yes    Post procedure follow up phone call. No answer at number given.  Left message on voicemail.

## 2023-06-21 ENCOUNTER — Ambulatory Visit: Payer: Medicare HMO

## 2023-06-21 DIAGNOSIS — Z23 Encounter for immunization: Secondary | ICD-10-CM

## 2023-06-21 LAB — SURGICAL PATHOLOGY

## 2023-06-22 ENCOUNTER — Encounter: Payer: Self-pay | Admitting: Gastroenterology

## 2023-07-06 DIAGNOSIS — M5416 Radiculopathy, lumbar region: Secondary | ICD-10-CM | POA: Diagnosis not present

## 2023-07-06 DIAGNOSIS — M47816 Spondylosis without myelopathy or radiculopathy, lumbar region: Secondary | ICD-10-CM | POA: Diagnosis not present

## 2023-08-04 ENCOUNTER — Telehealth: Payer: Self-pay | Admitting: Gastroenterology

## 2023-08-04 ENCOUNTER — Other Ambulatory Visit: Payer: Self-pay | Admitting: Family Medicine

## 2023-08-04 DIAGNOSIS — N6489 Other specified disorders of breast: Secondary | ICD-10-CM

## 2023-08-04 MED ORDER — OMEPRAZOLE 20 MG PO CPDR: 20.0000 mg | DELAYED_RELEASE_CAPSULE | Freq: Two times a day (BID) | ORAL | 11 refills | Status: AC

## 2023-08-04 NOTE — Telephone Encounter (Signed)
Inbound call from patient, would like omeprazole 20 mg called into her pharmacy.

## 2023-08-04 NOTE — Telephone Encounter (Signed)
Prescription for omeprazole 20 mg has been sent to patient's pharmacy per patient's request.

## 2023-09-15 DIAGNOSIS — J4530 Mild persistent asthma, uncomplicated: Secondary | ICD-10-CM | POA: Diagnosis not present

## 2023-09-15 DIAGNOSIS — H1045 Other chronic allergic conjunctivitis: Secondary | ICD-10-CM | POA: Diagnosis not present

## 2023-09-15 DIAGNOSIS — J3089 Other allergic rhinitis: Secondary | ICD-10-CM | POA: Diagnosis not present

## 2023-09-15 DIAGNOSIS — J301 Allergic rhinitis due to pollen: Secondary | ICD-10-CM | POA: Diagnosis not present

## 2023-10-06 NOTE — Progress Notes (Deleted)
 [facility] Return Visit   Referring Provider Tower, Audrie Gallus, MD [address] [place],  [place] [address]  Primary Care Provider Tower, Audrie Gallus, MD  Patient Profile: Jenny Mcfarland is a 73 y.o. female with a past medical history noteworthy for atypical lobular hyperplasia of the breast, HLD, osteopenia, vitamin B12 deficiency, vitamin D deficiency who returns to the [facility] for follow-up of the problem(s) noted below.  Problem List: GERD with LA Class B esophagitis Barrett's esophagus IBS, alternating pattern  Personal history of adenomatous colon polyps due for surveillance Normocytic anemia B12 deficiency    History of Present Illness   Jenny Mcfarland was last seen in the GI office 05/03/2023 by Dr. Russella Dar   Current GI Meds  Omeprazole 20 mg p.o. twice daily Dicyclomine 10 mg p.o. 3 times daily as needed  Interval History    Last colonoscopy: 06/16/2023 - LA grade A esophagitis, mild stenosis at the GE junction, medium HH, multiple small gastric polyps Last endoscopy: 06/16/2023 - external hemorrhoids, normal colonoscopy  Last Abd CT/CTE/MRE: ***  GI Review of Symptoms Significant for {GIROS:50592}. Otherwise negative.  General Review of Systems  Review of systems is significant for the pertinent positives and negatives as listed per the HPI.  Full ROS is otherwise negative.  Past Medical History   Past Medical History:  Diagnosis Date   Allergic rhinitis    Allergy    Anemia 02/15/2023   Arthritis    Asthma    WELL CONTROLLED   Barrett's esophagus 2005   Complication of anesthesia    GERD (gastroesophageal reflux disease) 11/2008   Hemorrhoids    Hiatal hernia    SMALL   History of chicken pox    IBS (irritable bowel syndrome)    Osteopenia    Peptic stricture of esophagus 11/2008   PONV (postoperative nausea and vomiting)    N/V WITH GALLBLADDER   Recurrent sinusitis      Past Surgical History    Past Surgical History:  Procedure Laterality Date   BREAST BIOPSY Right 07/26/2017   INTRADUCTAL PAPILLOMA.    BREAST BIOPSY Left 08/27/2020   affirm bx #1-"X" clip-   BREAST BIOPSY Left 08/27/2020   affirm bx-#2-"coil: clip-   BREAST BIOPSY Right 10/13/2021   [place] bx, heart marker, FIBROEPITHELIAL PROLIFERATION WITH SCLEROSIS AND USUAL DUCTAL HYPERPLASIA   BREAST BIOPSY Right 10/29/2021   right stereo bx X clip - FIBROEPITHELIAL PROLIFERATION WITH SCLEROSIS   BREAST BIOPSY WITH RADIO FREQUENCY LOCALIZER Left 09/26/2020   Procedure: BREAST BIOPSY WITH RADIO FREQUENCY LOCALIZER;  Surgeon: Carolan Shiver, MD;  Location: [facility];  Service: General;  Laterality: Left;   BREAST LUMPECTOMY Right 08/2017   BREAST LUMPECTOMY WITH NEEDLE LOCALIZATION Right 09/09/2017   Procedure: BREAST LUMPECTOMY WITH NEEDLE LOCALIZATION;  Surgeon: Nadeen Landau, MD;  Location: [facility];  Service: General;  Laterality: Right;   CHOLECYSTECTOMY  2010   TUBAL LIGATION       Allergies and Medications   Allergies  Allergen Reactions   Levofloxacin Itching and Other (See Comments)    Felt like she had bugs crawling all over her.   Tessalon [Benzonatate] Nausea And Vomiting    Made GERD worse    @MEDSTODAY @  Family History   Family History  Problem Relation Age of Onset   Aortic aneurysm Mother    Heart disease Mother    Heart attack Brother        early 27's   Arthritis Brother  Breast cancer Sister 12   Colon cancer Neg Hx    GI Specific Family History: {gifamhx:50061}   Social History   Social History   Tobacco Use   Smoking status: Never   Smokeless tobacco: Never  Vaping Use   Vaping status: Never Used  Substance Use Topics   Alcohol use: Not Currently    Comment: maybe 2 or 3 drinks a year   Drug use: Never   Jenny Mcfarland reports that she has never smoked. She has never used smokeless tobacco. She reports that she does not currently use alcohol. She reports that she  does not use drugs.  Vital Signs and Physical Examination  There were no vitals filed for this visit. There is no height or weight on file to calculate BMI.    General: Well developed, well nourished, no acute distress Head: Normocephalic and atraumatic Eyes: Sclerae anicteric, EOMI Ears: Normal auditory acuity Mouth: No deformities or lesions noted Lungs: Clear throughout to auscultation Heart: Regular rate and rhythm; No murmurs, rubs or bruits Abdomen: Soft, non tender and non distended. No masses, hepatosplenomegaly or hernias noted. Normal Bowel sounds Rectal: Musculoskeletal: Symmetrical with no gross deformities  Pulses:  Normal pulses noted Extremities: No edema or deformities noted Neurological: Alert oriented x 4, grossly nonfocal Psychological:  Alert and cooperative. Normal mood and affect   Review of Data  The following data was reviewed at the time of this encounter:  Laboratory Studies      Latest Ref Rng & Units 05/03/2023    3:42 PM 02/01/2023    2:04 PM 01/22/2022   10:33 AM  CBC  WBC 4.0 - 10.5 K/uL 6.6  5.6  5.8   Hemoglobin 12.0 - 15.0 g/dL 09.8  11.9  14.7   Hematocrit 36.0 - 46.0 % 35.7  33.1  37.1   Platelets 150.0 - 400.0 K/uL 244.0  250.0  223.0     Lab Results  Component Value Date   LIPASE 28.0 11/08/2008      Latest Ref Rng & Units 02/01/2023    2:04 PM 01/22/2022   10:33 AM 01/19/2021   10:11 AM  CMP  Glucose 70 - 99 mg/dL 77  88  88   BUN 6 - 23 mg/dL 11  8  9    Creatinine 0.40 - 1.20 mg/dL 8.29  5.62  1.30   Sodium 135 - 145 mEq/L 142  143  143   Potassium 3.5 - 5.1 mEq/L 4.0  4.2  4.5   Chloride 96 - 112 mEq/L 105  106  107   CO2 19 - 32 mEq/L 25  30  29    Calcium 8.4 - 10.5 mg/dL 9.7  9.4  9.5   Total Protein 6.0 - 8.3 g/dL 6.9  6.5  6.5   Total Bilirubin 0.2 - 1.2 mg/dL 0.7  1.0  0.9   Alkaline Phos 39 - 117 U/L 61  69  75   AST 0 - 37 U/L 17  11  12    ALT 0 - 35 U/L 14  8  11       Imaging Studies    GI Procedures and  Studies  EGD/Colonoscopy 06/16/2023  EGD/colonoscopy 03/12/2016  EGD 01/06/2012  EGD 12/06/2008  EGD/colonoscopy 12/21/2005  EGD 01/29/2004  EGD 12/17/2003   Clinical Impression  It is my clinical impression that Jenny Mcfarland is a 73 y.o. female with;  ***  Plan  *** *** *** *** ***   Planned Follow Up No follow-ups on file.  The patient or caregiver verbalized understanding of the material covered, with no barriers to understanding. All questions were answered. Patient or caregiver is agreeable with the plan outlined above.    It was a pleasure to see Jenny Mcfarland.  If you have any questions or concerns regarding this evaluation, do not hesitate to contact me.  Maren Beach, MD [facility]

## 2023-10-07 ENCOUNTER — Ambulatory Visit
Admission: RE | Admit: 2023-10-07 | Discharge: 2023-10-07 | Disposition: A | Discharge status: Home / Self Care | Payer: Medicare HMO | Source: Ambulatory Visit | Attending: Family Medicine | Admitting: Family Medicine

## 2023-10-07 DIAGNOSIS — R92323 Mammographic fibroglandular density, bilateral breasts: Secondary | ICD-10-CM | POA: Diagnosis not present

## 2023-10-07 DIAGNOSIS — N6489 Other specified disorders of breast: Secondary | ICD-10-CM | POA: Insufficient documentation

## 2023-10-07 DIAGNOSIS — R928 Other abnormal and inconclusive findings on diagnostic imaging of breast: Secondary | ICD-10-CM | POA: Diagnosis not present

## 2023-10-07 DIAGNOSIS — D241 Benign neoplasm of right breast: Secondary | ICD-10-CM | POA: Diagnosis not present

## 2023-10-09 ENCOUNTER — Encounter: Payer: Self-pay | Admitting: Family Medicine

## 2023-10-11 ENCOUNTER — Ambulatory Visit: Payer: Medicare HMO | Admitting: Pediatrics

## 2023-11-03 NOTE — Progress Notes (Signed)
 [facility] Return Visit   Referring Provider Tower, Audrie Gallus, MD [address] [place],  [place] [address]  Primary Care Provider Tower, Audrie Gallus, MD  Patient Profile: Jenny Mcfarland is a 73 y.o. female who returns to the [facility] for follow-up of the problem(s) noted below.  Problem List: GERD with LA Class A-B esophagitis IBS, alternating pattern  Personal history of adenomatous colon polyps  Normocytic anemia B12 deficiency    History of Present Illness   Jenny Mcfarland was last seen in the GI office 05/03/2023 by Dr. Russella Dar   Current GI Meds  Omeprazole 20 mg orally daily Dicyclomine 10 mg capsule  Interval History   GERD, history of esophagitis -- Underwent EGD 05/2023 with results as outlined below -- Increased omeprazole to 40 mg for 1 month and then decrease back to 20 mg daily around Thanksgiving -- States that symptoms have been well-controlled on lower dose of PPI -- Denies heartburn, regurgitation, pyrosis, dysphagia or odynophagia -- Triggers include spicy food, acidic food, orange juice and red meat -she is able to tolerate tomato based products -- Also notes worsening symptoms when Valsalva's with bowel movement  IBS -- Has a history of alternating bowel movements with diarrhea and constipation -- Can stool daily or go up to 3 to 4 days without a bowel movement -- Has used a variety of OTC laxatives which often result in watery bowel movements -- Endorses some fecal urgency after eating -- Has not used dicyclomine x 3 to 4 months -- Declines trialing prescription laxatives such as Amitiza, Linzess or Motegrity -wants to continue current regimen  History of adenomatous colon polyps -- Colonoscopy 2017-2 tubular adenomas -- Colonoscopy 2024-normal without polyps -- No family history of colorectal cancer or polyps -- Discussed that based upon age and normal colonoscopy in 2024 it is reasonable to cease ongoing  polyp surveillance colonoscopies and she is in agreement  Last colonoscopy: 05/2023 -normal, IH Last endoscopy:  05/2023 -LA grade A esophagitis, mild stenosis at GE junction, medium HH, multiple gastric polyps (fundic gland)  Last Abd CT/CTE/MRE: None  GI Review of Symptoms Significant for alternating constipation and diarrhea. Otherwise negative.  General Review of Systems  Review of systems is significant for the pertinent positives and negatives as listed per the HPI.  Full ROS is otherwise negative.  Past Medical History   Past Medical History:  Diagnosis Date   Allergic rhinitis    Allergy    Anemia 02/15/2023   Arthritis    Asthma    WELL CONTROLLED   Barrett's esophagus 2005   Complication of anesthesia    GERD (gastroesophageal reflux disease) 11/2008   Hemorrhoids    Hiatal hernia    SMALL   History of chicken pox    IBS (irritable bowel syndrome)    Osteopenia    Peptic stricture of esophagus 11/2008   PONV (postoperative nausea and vomiting)    N/V WITH GALLBLADDER   Recurrent sinusitis      Past Surgical History   Past Surgical History:  Procedure Laterality Date   BREAST BIOPSY Right 07/26/2017   INTRADUCTAL PAPILLOMA.    BREAST BIOPSY Left 08/27/2020   affirm bx #1-"X" clip-   BREAST BIOPSY Left 08/27/2020   affirm bx-#2-"coil: clip-   BREAST BIOPSY Right 10/13/2021   [place] bx, heart marker, FIBROEPITHELIAL PROLIFERATION WITH SCLEROSIS AND USUAL DUCTAL HYPERPLASIA   BREAST BIOPSY Right 10/29/2021   right stereo bx X clip - FIBROEPITHELIAL PROLIFERATION  WITH SCLEROSIS   BREAST BIOPSY WITH RADIO FREQUENCY LOCALIZER Left 09/26/2020   Procedure: BREAST BIOPSY WITH RADIO FREQUENCY LOCALIZER;  Surgeon: Carolan Shiver, MD;  Location: [facility] ORS;  Service: General;  Laterality: Left;   BREAST LUMPECTOMY Right 08/2017   BREAST LUMPECTOMY WITH NEEDLE LOCALIZATION Right 09/09/2017   Procedure: BREAST LUMPECTOMY WITH NEEDLE LOCALIZATION;  Surgeon: Nadeen Landau, MD;  Location: [facility] OR[facility];  Service: General;  Laterality: Right;   CHOLECYSTECTOMY  2010   TUBAL LIGATION       Allergies and Medications   Allergies  Allergen Reactions   Levofloxacin Itching and Other (See Comments)    Felt like she had bugs crawling all over her.   Tessalon [Benzonatate] Nausea And Vomiting    Made GERD worse    @MEDSTODAY @  Family History   Family History  Problem Relation Age of Onset   Aortic aneurysm Mother    Heart disease Mother    Heart attack Brother        early 68's   Arthritis Brother    Breast cancer Sister 37   Colon cancer Neg Hx     Social History   Social History   Tobacco Use   Smoking status: Never   Smokeless tobacco: Never  Vaping Use   Vaping status: Never Used  Substance Use Topics   Alcohol use: Not Currently    Comment: maybe 2 or 3 drinks a year   Drug use: Never   Jenny Mcfarland reports that she has never smoked. She has never used smokeless tobacco. She reports that she does not currently use alcohol. She reports that she does not use drugs.  Vital Signs and Physical Examination   Vitals:   11/04/23 1548  BP: 126/70  Pulse: 67   Body mass index is 25.58 kg/m. Weight: 149 lb (67.6 kg)  General: Well developed, well nourished, no acute distress Head: Normocephalic and atraumatic Eyes: Sclerae anicteric, EOMI Lungs: Clear throughout to auscultation Heart: Regular rate and rhythm; No murmurs, rubs or bruits Abdomen: Soft, non tender and non distended. No masses, hepatosplenomegaly or hernias noted. Normal Bowel sounds Rectal: Deferred Musculoskeletal: Symmetrical with no gross deformities  Pulses:  Normal pulses noted   Review of Data  The following data was reviewed at the time of this encounter:  Laboratory Studies      Latest Ref Rng & Units 05/03/2023    3:42 PM 02/01/2023    2:04 PM 01/22/2022   10:33 AM  CBC  WBC 4.0 - 10.5 K/uL 6.6  5.6  5.8   Hemoglobin 12.0 - 15.0 g/dL 16.1  09.6  04.5    Hematocrit 36.0 - 46.0 % 35.7  33.1  37.1   Platelets 150.0 - 400.0 K/uL 244.0  250.0  223.0     Lab Results  Component Value Date   LIPASE 28.0 11/08/2008      Latest Ref Rng & Units 02/01/2023    2:04 PM 01/22/2022   10:33 AM 01/19/2021   10:11 AM  CMP  Glucose 70 - 99 mg/dL 77  88  88   BUN 6 - 23 mg/dL 11  8  9    Creatinine 0.40 - 1.20 mg/dL 4.09  8.11  9.14   Sodium 135 - 145 mEq/L 142  143  143   Potassium 3.5 - 5.1 mEq/L 4.0  4.2  4.5   Chloride 96 - 112 mEq/L 105  106  107   CO2 19 - 32 mEq/L 25  30  29   Calcium 8.4 - 10.5 mg/dL 9.7  9.4  9.5   Total Protein 6.0 - 8.3 g/dL 6.9  6.5  6.5   Total Bilirubin 0.2 - 1.2 mg/dL 0.7  1.0  0.9   Alkaline Phos 39 - 117 U/L 61  69  75   AST 0 - 37 U/L 17  11  12    ALT 0 - 35 U/L 14  8  11       Imaging Studies  None  GI Procedures and Studies  EGD/Colonoscopy 05/2023 EGD - LA grade A esophagitis, mild stenosis at GE junction, medium HH, multiple gastric polyps (fundic gland) Colonoscopy - Normal, IH  EGD/Colonoscopy 02/2016 EGD -LA grade B esophagitis, medium HH, multiple gastric polyps (fundic gland polyps) Colonoscopy -2 sessile polyps (TA), small angiodysplastic lesion in [facility], IH  EGD 12/2011 LA Grade B esophagitis, stricture at GE junction, suspected Barrett's esophagus, HH   Clinical Impression  It is my clinical impression that Ms. Harmsen is a 73 y.o. female with;  GERD with LA Class A-B esophagitis IBS, alternating pattern  Personal history of adenomatous colon polyps  Normocytic anemia B12 deficiency   Myliah presents for follow-up of multiple gastrointestinal issues as outlined above.  With regard to her history of GERD and esophagitis she is currently faring well on omeprazole 20 mg orally daily.  Denies overt symptoms of reflux and is cognizant to avoid specific food triggers.  At today's visit we discussed continuing on her current regimen of PPI and lifestyle modification.  She has had a longstanding  history of irritable bowel syndrome with alternating constipation and diarrhea.  It has been difficult for her to find a happy medium regulating her bowel movements.  She has been using OTC laxative agents.  She declines the use of prescription laxatives at this time and does not wish to make any changes.  She has dicyclomine available should she develop abdominal pain.  Based upon her normal colonoscopy in 2024 and age, we discussed ceasing ongoing polyp surveillance colonoscopies.  She is in agreement with this.  Should she develop any new symptoms that warrant diagnostic evaluation such as rectal bleeding, etc. reviewed that that would be a different scenario and would be appropriate to perform investigation..  Plan  Continue omeprazole 20 mg orally daily 20 to 30 minutes before meal Continue lifestyle modification and avoiding trigger foods that exacerbate acid reflux Continue OTC laxatives Continue dicyclomine as needed for abdominal pain, fecal urgency if it recurs As above, advised ceasing ongoing colon polyp surveillance based upon age and previous normal findings  Planned Follow Up 1 year  The patient or caregiver verbalized understanding of the material covered, with no barriers to understanding. All questions were answered. Patient or caregiver is agreeable with the plan outlined above.    It was a pleasure to see Sascha.  If you have any questions or concerns regarding this evaluation, do not hesitate to contact me.  Maren Beach, MD [facility]

## 2023-11-04 ENCOUNTER — Encounter: Payer: Self-pay | Admitting: Pediatrics

## 2023-11-04 ENCOUNTER — Ambulatory Visit: Payer: Medicare HMO | Admitting: Pediatrics

## 2023-11-04 VITALS — BP 126/70 | HR 67 | Ht 64.0 in | Wt 149.0 lb

## 2023-11-04 DIAGNOSIS — D649 Anemia, unspecified: Secondary | ICD-10-CM | POA: Diagnosis not present

## 2023-11-04 DIAGNOSIS — Z860101 Personal history of adenomatous and serrated colon polyps: Secondary | ICD-10-CM

## 2023-11-04 DIAGNOSIS — E538 Deficiency of other specified B group vitamins: Secondary | ICD-10-CM | POA: Diagnosis not present

## 2023-11-04 DIAGNOSIS — K2100 Gastro-esophageal reflux disease with esophagitis, without bleeding: Secondary | ICD-10-CM | POA: Diagnosis not present

## 2023-11-04 DIAGNOSIS — K582 Mixed irritable bowel syndrome: Secondary | ICD-10-CM | POA: Diagnosis not present

## 2023-11-04 NOTE — Patient Instructions (Signed)
 Follow up in 1 year  If your blood pressure at your visit was 140/90 or greater, please contact your primary care physician to follow up on this.  _______________________________________________________  If you are age 72 or older, your body mass index should be between 23-30. Your Body mass index is 25.58 kg/m. If this is out of the aforementioned range listed, please consider follow up with your Primary Care Provider.  If you are age 35 or younger, your body mass index should be between 19-25. Your Body mass index is 25.58 kg/m. If this is out of the aformentioned range listed, please consider follow up with your Primary Care Provider.   ________________________________________________________  The [facility] providers would like to encourage you to use [facility] to communicate with providers for non-urgent requests or questions.  Due to long hold times on the telephone, sending your provider a message by [facility] may be a faster and more efficient way to get a response.  Please allow 48 business hours for a response.  Please remember that this is for non-urgent requests.  _______________________________________________________   Thank you for entrusting me with your care and choosing [facility].  Dr Doy Hutching

## 2023-11-14 ENCOUNTER — Other Ambulatory Visit: Payer: Self-pay | Admitting: Family Medicine

## 2023-11-14 NOTE — Telephone Encounter (Signed)
 Name of Medication: Ambien Name of Pharmacy: [facility] [address] Last Fill or Written Date and Quantity: 05/24/23 #30 tabs/ 3 refills Last Office Visit and Type: CPE on 02/15/23 Next Office Visit and Type: 02/21/24

## 2024-01-26 ENCOUNTER — Ambulatory Visit: Payer: Self-pay

## 2024-01-26 NOTE — Telephone Encounter (Signed)
 I am out of town until Monday Aware, will watch for correspondence thanks

## 2024-01-26 NOTE — Telephone Encounter (Addendum)
  Chief Complaint: heart fluttering Symptoms: "funny feeling", fluttering Frequency: intermittent events x 3 months Pertinent Negatives: Patient denies fever, CP, SOB, current sx Disposition: [] ED /[] Urgent Care (no appt availability in office) / [x] Appointment(In office/virtual)/ []  [facility]/ [] Home Care/ [] Refused Recommended Disposition /[] [facility]/ []  Follow-up with PCP Additional Notes: Pt c/o of heart flutters x a few events for the past 3-4 months. Pt endorses she notices events upon waking and lasts a few seconds once she gets up and moving. Pt denies any other sx and cardiac issues aside from "low heartbeat". Triager explained disposition thoroughly and scheduled pt with alternate provider on 01/27/2024 and strongly advised ED/UC if not convenient for pt. Patient verbalized understanding and to call back/go to ED/UC with worsening symptoms.    Copied from CRM [id number]. Topic: Clinical - Red Word Triage >> Jan 26, 2024 10:55 AM Varney Gentleman wrote: [facility] that prompted transfer to Nurse Triage: Patient states she's maybe having some heart flutters. Reason for Disposition  Age > 60 years  (Exception: Brief heartbeat symptoms that went away and now feels well.)  Answer Assessment - Initial Assessment Questions 1. DESCRIPTION: "Please describe your heart rate or heartbeat that you are having" (e.g., fast/slow, regular/irregular, skipped or extra beats, "palpitations")     Pt reports "heart would flutter for a couple of seconds" Notices predominantly in the AM when waking up - "funny feeling" - resolves once getting up and moving  2. ONSET: "When did it start?" (Minutes, hours or days)      3 months 3. DURATION: "How long does it last" (e.g., seconds, minutes, hours)     seconds 4. PATTERN "Does it come and go, or has it been constant since it started?"  "Does it get worse with exertion?"   "Are you feeling it now?"     Come and go 5. TAP: "Using your hand,  can you tap out what you are feeling on a chair or table in front of you, so that I can hear?" (Note: not all patients can do this)       unable 6. HEART RATE: "Can you tell me your heart rate?" "How many beats in 15 seconds?"  (Note: not all patients can do this)       unable 7. RECURRENT SYMPTOM: "Have you ever had this before?" If Yes, ask: "When was the last time?" and "What happened that time?"      First time 8. CAUSE: "What do you think is causing the palpitations?"     unknown 9. CARDIAC HISTORY: "Do you have any history of heart disease?" (e.g., heart attack, angina, bypass surgery, angioplasty, arrhythmia)      Denies Pt reports "slow heartbeat" around 61 10. OTHER SYMPTOMS: "Do you have any other symptoms?" (e.g., dizziness, chest pain, sweating, difficulty breathing)       denies 11. PREGNANCY: "Is there any chance you are pregnant?" "When was your last menstrual period?"       N/a  Protocols used: Heart Rate and Heartbeat Questions-A-AH

## 2024-01-26 NOTE — Telephone Encounter (Signed)
 Noted--I will check her at tomorrow's visit

## 2024-01-27 ENCOUNTER — Ambulatory Visit: Payer: Self-pay | Admitting: Internal Medicine

## 2024-01-27 ENCOUNTER — Ambulatory Visit (INDEPENDENT_AMBULATORY_CARE_PROVIDER_SITE_OTHER): Admitting: Internal Medicine

## 2024-01-27 ENCOUNTER — Encounter: Payer: Self-pay | Admitting: Internal Medicine

## 2024-01-27 VITALS — BP 138/68 | HR 59 | Temp 98.5°F | Ht 64.0 in | Wt 149.0 lb

## 2024-01-27 DIAGNOSIS — R002 Palpitations: Secondary | ICD-10-CM | POA: Insufficient documentation

## 2024-01-27 DIAGNOSIS — E559 Vitamin D deficiency, unspecified: Secondary | ICD-10-CM

## 2024-01-27 DIAGNOSIS — E538 Deficiency of other specified B group vitamins: Secondary | ICD-10-CM | POA: Diagnosis not present

## 2024-01-27 DIAGNOSIS — E785 Hyperlipidemia, unspecified: Secondary | ICD-10-CM

## 2024-01-27 LAB — CBC
HCT: 36.5 % (ref 36.0–46.0)
Hemoglobin: 12 g/dL (ref 12.0–15.0)
MCHC: 33 g/dL (ref 30.0–36.0)
MCV: 84.1 fl (ref 78.0–100.0)
Platelets: 212 10*3/uL (ref 150.0–400.0)
RBC: 4.34 Mil/uL (ref 3.87–5.11)
RDW: 13.9 % (ref 11.5–15.5)
WBC: 5.7 10*3/uL (ref 4.0–10.5)

## 2024-01-27 LAB — TSH: TSH: 2.47 u[IU]/mL (ref 0.35–5.50)

## 2024-01-27 LAB — IBC + FERRITIN
Ferritin: 13.7 ng/mL (ref 10.0–291.0)
Iron: 66 ug/dL (ref 42–145)
Saturation Ratios: 16.7 % — ABNORMAL LOW (ref 20.0–50.0)
TIBC: 394.8 ug/dL (ref 250.0–450.0)
Transferrin: 282 mg/dL (ref 212.0–360.0)

## 2024-01-27 LAB — LIPID PANEL
Cholesterol: 220 mg/dL — ABNORMAL HIGH (ref 0–200)
HDL: 83.6 mg/dL (ref >39.00)
LDL Cholesterol: 119 mg/dL — ABNORMAL HIGH (ref 0–99)
NonHDL: 136.44
Total CHOL/HDL Ratio: 3
Triglycerides: 89 mg/dL (ref 0.0–149.0)
VLDL: 17.8 mg/dL (ref 0.0–40.0)

## 2024-01-27 LAB — VITAMIN B12: Vitamin B-12: 989 pg/mL — ABNORMAL HIGH (ref 211–911)

## 2024-01-27 LAB — VITAMIN D 25 HYDROXY (VIT D DEFICIENCY, FRACTURES): VITD: 25.87 ng/mL — ABNORMAL LOW (ref 30.00–100.00)

## 2024-01-27 LAB — COMPREHENSIVE METABOLIC PANEL WITH GFR
ALT: 9 U/L (ref 0–35)
AST: 13 U/L (ref 0–37)
Albumin: 4.3 g/dL (ref 3.5–5.2)
Alkaline Phosphatase: 74 U/L (ref 39–117)
BUN: 8 mg/dL (ref 6–23)
CO2: 31 meq/L (ref 19–32)
Calcium: 9.8 mg/dL (ref 8.4–10.5)
Chloride: 105 meq/L (ref 96–112)
Creatinine, Ser: 0.83 mg/dL (ref 0.40–1.20)
GFR: 70.51 mL/min (ref >60.00)
Glucose, Bld: 94 mg/dL (ref 70–99)
Potassium: 3.9 meq/L (ref 3.5–5.1)
Sodium: 143 meq/L (ref 135–145)
Total Bilirubin: 0.9 mg/dL (ref 0.2–1.2)
Total Protein: 6.8 g/dL (ref 6.0–8.3)

## 2024-01-27 NOTE — Assessment & Plan Note (Addendum)
 No racing heart and has low resting heart rate Symptoms coming out of sleep and suggests PAC due to lower pacer rate No exertional symptoms Will check EKG and labs---just in case' EKG shows sinus brady at 53. Normal axis and intervals. Diffuse mild ST changes but no change from 2022 Reassured--unless has exercise symptoms--no action should be needed

## 2024-01-27 NOTE — Progress Notes (Signed)
 Subjective:    Patient ID: Jenny Mcfarland, female    DOB: October 24, 1950, 73 y.o.   MRN: [id number]  HPI Here due to heart fluttering  When she sits on couch in afternoon, or am tired. Will feel her heart briefly===Started in the past few days Will occasionally wake with a "flutter-flutter"--this goes back months Then goes away when she moves around Some "funny feeling"---not a pain (like heart is doing something) Doesn't happen with exertion  Walks some and does some hand weights.  Aware of breathing if she walks up hill--but this is not new Uses inhaler regularly--wixela (and also has a rescue) No clear chest pain No dizziness or syncope No edema  Current Outpatient Medications on File Prior to Visit  Medication Sig Dispense Refill   albuterol (VENTOLIN HFA) 108 (90 Base) MCG/ACT inhaler Inhale 2 puffs into the lungs every 6 (six) hours as needed for wheezing or shortness of breath.     Cholecalciferol (VITAMIN D3) 1000 units CAPS Take by mouth. Pt taking 1 every other day     dicyclomine  (BENTYL ) 10 MG capsule Take 1 capsule (10 mg total) by mouth 3 (three) times daily as needed. 90 capsule 11   fexofenadine (ALLEGRA) 180 MG tablet Take 180 mg by mouth every morning.     iron  polysaccharides (NU-IRON ) 150 MG capsule Take 1 capsule (150 mg total) by mouth 2 (two) times daily. 60 capsule 2   levalbuterol (XOPENEX HFA) 45 MCG/ACT inhaler Inhale 1 puff into the lungs every 4 (four) hours as needed for wheezing.     omeprazole  (PRILOSEC) 20 MG capsule Take 1 capsule (20 mg total) by mouth 2 (two) times daily before a meal. 60 capsule 11   Probiotic Product (ALIGN) 4 MG CAPS Take 1 capsule by mouth daily as needed (upset stomach).     vitamin B-12 (CYANOCOBALAMIN ) 1000 MCG tablet Take 1,000 mcg by mouth daily.     WIXELA INHUB 250-50 MCG/ACT AEPB      zolpidem  (AMBIEN ) 5 MG tablet TAKE 1/2 TO 1 (ONE-HALF TO ONE) TABLET BY MOUTH AT BEDTIME 30 tablet 3   No current  facility-administered medications on file prior to visit.    Allergies  Allergen Reactions   Levofloxacin Itching and Other (See Comments)    Felt like she had bugs crawling all over her.   Tessalon  [Benzonatate ] Nausea And Vomiting    Made GERD worse    Past Medical History:  Diagnosis Date   Allergic rhinitis    Allergy    Anemia 02/15/2023   Arthritis    Asthma    WELL CONTROLLED   Barrett's esophagus 2005   Complication of anesthesia    GERD (gastroesophageal reflux disease) 11/2008   Hemorrhoids    Hiatal hernia    SMALL   History of chicken pox    IBS (irritable bowel syndrome)    Osteopenia    Peptic stricture of esophagus 11/2008   PONV (postoperative nausea and vomiting)    N/V WITH GALLBLADDER   Recurrent sinusitis     Past Surgical History:  Procedure Laterality Date   BREAST BIOPSY Right 07/26/2017   INTRADUCTAL PAPILLOMA.    BREAST BIOPSY Left 08/27/2020   affirm bx #1-"X" clip-   BREAST BIOPSY Left 08/27/2020   affirm bx-#2-"coil: clip-   BREAST BIOPSY Right 10/13/2021   us  bx, heart marker, FIBROEPITHELIAL PROLIFERATION WITH SCLEROSIS AND USUAL DUCTAL HYPERPLASIA   BREAST BIOPSY Right 10/29/2021   right stereo bx X clip - FIBROEPITHELIAL  PROLIFERATION WITH SCLEROSIS   BREAST BIOPSY WITH RADIO FREQUENCY LOCALIZER Left 09/26/2020   Procedure: BREAST BIOPSY WITH RADIO FREQUENCY LOCALIZER;  Surgeon: Eldred Grego, MD;  Location: [facility];  Service: General;  Laterality: Left;   BREAST LUMPECTOMY Right 08/2017   BREAST LUMPECTOMY WITH NEEDLE LOCALIZATION Right 09/09/2017   Procedure: BREAST LUMPECTOMY WITH NEEDLE LOCALIZATION;  Surgeon: Benancio Bracket, MD;  Location: [facility];  Service: General;  Laterality: Right;   CHOLECYSTECTOMY  2010   TUBAL LIGATION      Family History  Problem Relation Age of Onset   Aortic aneurysm Mother    Heart disease Mother    Heart attack Brother        early 45's   Arthritis Brother    Breast cancer  Sister 68   Colon cancer Neg Hx     Social History   Socioeconomic History   Marital status: Divorced    Spouse name: Not on file   Number of children: 2   Years of education: Not on file   Highest education level: 12th grade  Occupational History   Occupation: Scientist, physiological for pediatrician  Tobacco Use   Smoking status: Never   Smokeless tobacco: Never  Vaping Use   Vaping status: Never Used  Substance and Sexual Activity   Alcohol use: Not Currently    Comment: maybe 2 or 3 drinks a year   Drug use: Never   Sexual activity: Not Currently  Other Topics Concern   Not on file  Social History Narrative   Daily caffeine use - soft drinks/tea       Separating from husband 1/17   Social Drivers of Health   Financial Resource Strain: Low Risk  (01/26/2024)   Overall Financial Resource Strain (CARDIA)    Difficulty of Paying Living Expenses: Not very hard  Food Insecurity: No Food Insecurity (01/26/2024)   Hunger Vital Sign    Worried About Running Out of Food in the Last Year: Never true    Ran Out of Food in the Last Year: Never true  Transportation Needs: No Transportation Needs (01/26/2024)   PRAPARE - Administrator, Civil Service (Medical): No    Lack of Transportation (Non-Medical): No  Physical Activity: Insufficiently Active (01/26/2024)   Exercise Vital Sign    Days of Exercise per Week: 2 days    Minutes of Exercise per Session: 20 min  Stress: No Stress Concern Present (01/26/2024)   [employer] of Occupational Health - Occupational Stress Questionnaire    Feeling of Stress : Only a little  Social Connections: Moderately Isolated (01/26/2024)   Social Connection and Isolation Panel [NHANES]    Frequency of Communication with Friends and Family: Three times a week    Frequency of Social Gatherings with Friends and Family: Once a week    Attends Religious Services: More than 4 times per year    Active Member of [employer] or Organizations: No     Attends Banker Meetings: Never    Marital Status: Divorced  Catering manager Violence: Not At Risk (02/02/2023)   Humiliation, Afraid, Rape, and Kick questionnaire    Fear of Current or Ex-Partner: No    Emotionally Abused: No    Physically Abused: No    Sexually Abused: No   Review of Systems No recent illness with fever Some cough with the pollen--but not recently No N/V No heartburn on the omeprazole     Objective:   Physical Exam Constitutional:  Appearance: Normal appearance.  Cardiovascular:     Rate and Rhythm: Normal rate and regular rhythm.     Heart sounds: No murmur heard.    No gallop.     Comments: HR 60 Pulmonary:     Effort: Pulmonary effort is normal.     Breath sounds: Normal breath sounds. No wheezing or rales.  Abdominal:     Palpations: Abdomen is soft.     Tenderness: There is no abdominal tenderness.  Musculoskeletal:     Cervical back: Neck supple.     Right lower leg: No edema.     Left lower leg: No edema.  Lymphadenopathy:     Cervical: No cervical adenopathy.  Neurological:     Mental Status: She is alert.            Assessment & Plan:

## 2024-02-05 ENCOUNTER — Telehealth: Payer: Self-pay | Admitting: Family Medicine

## 2024-02-05 DIAGNOSIS — E538 Deficiency of other specified B group vitamins: Secondary | ICD-10-CM

## 2024-02-05 DIAGNOSIS — D649 Anemia, unspecified: Secondary | ICD-10-CM

## 2024-02-05 DIAGNOSIS — Z79899 Other long term (current) drug therapy: Secondary | ICD-10-CM

## 2024-02-05 DIAGNOSIS — E785 Hyperlipidemia, unspecified: Secondary | ICD-10-CM

## 2024-02-05 DIAGNOSIS — E559 Vitamin D deficiency, unspecified: Secondary | ICD-10-CM

## 2024-02-05 NOTE — Telephone Encounter (Signed)
-----   Message from Gerry Krone sent at 01/24/2024  2:55 PM EDT ----- Regarding: Lab orders for Tues,6.10.25 Patient is scheduled for CPX labs, please order future labs, Thanks , Anselmo Kings

## 2024-02-06 ENCOUNTER — Ambulatory Visit (INDEPENDENT_AMBULATORY_CARE_PROVIDER_SITE_OTHER): Payer: Medicare HMO

## 2024-02-06 VITALS — BP 138/68 | Ht 64.0 in | Wt 149.0 lb

## 2024-02-06 DIAGNOSIS — Z5320 Procedure and treatment not carried out because of patient's decision for unspecified reasons: Secondary | ICD-10-CM

## 2024-02-06 DIAGNOSIS — Z2821 Immunization not carried out because of patient refusal: Secondary | ICD-10-CM

## 2024-02-06 DIAGNOSIS — Z0000 Encounter for general adult medical examination without abnormal findings: Secondary | ICD-10-CM

## 2024-02-06 NOTE — Patient Instructions (Signed)
 Ms. Briddell , Thank you for taking time out of your busy schedule to complete your Annual Wellness Visit with me. I enjoyed our conversation and look forward to speaking with you again next year. I, as well as your care team,  appreciate your ongoing commitment to your health goals. Please review the following plan we discussed and let me know if I can assist you in the future. Your Game plan/ To Do List    Referrals: If you haven't heard from the office you've been referred to, please reach out to them at the phone provided.  none Follow up Visits: Next Medicare AWV with our clinical staff:    Have you seen your provider in the last 6 months (3 months if uncontrolled diabetes)? No Next Office Visit with your provider:   Clinician Recommendations:  Aim for 30 minutes of exercise or brisk walking, 6-8 glasses of water, and 5 servings of fruits and vegetables each day.       This is a list of the screening recommended for you and due dates:  Health Maintenance  Topic Date Due   Hepatitis C Screening  Never done   Zoster (Shingles) Vaccine (1 of 2) Never done   DTaP/Tdap/Td vaccine (2 - Td or Tdap) 05/24/2021   COVID-19 Vaccine (4 - 2024-25 season) 05/01/2023   Flu Shot  03/30/2024   DEXA scan (bone density measurement)  03/31/2024   Mammogram  10/06/2024   Medicare Annual Wellness Visit  02/05/2025   Pneumonia Vaccine  Completed   HPV Vaccine  Aged Out   Meningitis B Vaccine  Aged Out   Colon Cancer Screening  Discontinued    Advanced directives: (In Chart) A copy of your advanced directives are scanned into your chart should your provider ever need it. Advance Care Planning is important because it:  [x]  Makes sure you receive the medical care that is consistent with your values, goals, and preferences  [x]  It provides guidance to your family and loved ones and reduces their decisional burden about whether or not they are making the right decisions based on your wishes.  Follow the  link provided in your after visit summary or read over the paperwork we have mailed to you to help you started getting your Advance Directives in place. If you need assistance in completing these, please reach out to us  so that we can help you!  See attachments for Preventive Care and Fall Prevention Tips.

## 2024-02-06 NOTE — Progress Notes (Signed)
 Because this visit was a virtual/telehealth visit,  certain criteria was not obtained, such a blood pressure, CBG if applicable, and timed get up and go. Any medications not marked as "taking" were not mentioned during the medication reconciliation part of the visit. Any vitals not documented were not able to be obtained due to this being a telehealth visit or patient was unable to self-report a recent blood pressure reading due to a lack of equipment at home via telehealth. Vitals that have been documented are verbally provided by the patient.  This visit was performed by a medical professional under my direct supervision. I was immediately available for consultation/collaboration. I have reviewed and agree with the Annual Wellness Visit documentation.  Subjective:   Jenny Mcfarland is a 73 y.o. who presents for a Medicare Wellness preventive visit.  As a reminder, Annual Wellness Visits don't include a physical exam, and some assessments may be limited, especially if this visit is performed virtually. We may recommend an in-person follow-up visit with your provider if needed.  Visit Complete: Virtual I connected with  Lauree Pomfret on 02/06/24 by a audio enabled telemedicine application and verified that I am speaking with the correct person using two identifiers.  Patient Location: Home  Provider Location: Home Office  I discussed the limitations of evaluation and management by telemedicine. The patient expressed understanding and agreed to proceed.  Vital Signs: Because this visit was a virtual/telehealth visit, some criteria may be missing or patient reported. Any vitals not documented were not able to be obtained and vitals that have been documented are patient reported.  VideoDeclined- This patient declined Librarian, academic. Therefore the visit was completed with audio only.  Persons Participating in Visit: Patient.  AWV Questionnaire: Yes:  Patient Medicare AWV questionnaire was completed by the patient on 02/02/2024; I have confirmed that all information answered by patient is correct and no changes since this date.  Cardiac Risk Factors include: advanced age (>14men, >91 women)     Objective:     Today's Vitals   02/06/24 1030  BP: 138/68  Weight: 149 lb (67.6 kg)  Height: 5\' 4"  (1.626 m)   Body mass index is 25.58 kg/m.     02/06/2024   10:29 AM 02/02/2023   10:22 AM 01/21/2022   11:18 AM 01/20/2021   11:12 AM 09/26/2020    8:52 AM 09/19/2020   12:13 PM 01/06/2020    9:18 AM  Advanced Directives  Does Patient Have a Medical Advance Directive? Yes Yes Yes Yes Yes Yes Yes  Type of Estate agent of [place];Living will Healthcare Power of [place];Living will Living will Healthcare Power of [place];Living will Healthcare Power of [employer] of [place];Living will  Does patient want to make changes to medical advance directive? No - Patient declined No - Patient declined Yes (Inpatient - patient defers changing a medical advance directive and declines information at this time)  No - Patient declined  No - Patient declined  Copy of Healthcare Power of Attorney in Chart? Yes - validated most recent copy scanned in chart (See row information) Yes - validated most recent copy scanned in chart (See row information)  Yes - validated most recent copy scanned in chart (See row information) No - copy requested  Yes - validated most recent copy scanned in chart (See row information)    Current Medications (verified) Outpatient Encounter Medications as of 02/06/2024  Medication Sig   albuterol (VENTOLIN HFA) 108 (  90 Base) MCG/ACT inhaler Inhale 2 puffs into the lungs every 6 (six) hours as needed for wheezing or shortness of breath.   Cholecalciferol (VITAMIN D3) 1000 units CAPS Take by mouth. Pt taking 1 every other day   dicyclomine  (BENTYL ) 10 MG capsule Take 1 capsule (10 mg total) by mouth 3  (three) times daily as needed.   fexofenadine (ALLEGRA) 180 MG tablet Take 180 mg by mouth every morning.   iron  polysaccharides (NU-IRON ) 150 MG capsule Take 1 capsule (150 mg total) by mouth 2 (two) times daily.   levalbuterol (XOPENEX HFA) 45 MCG/ACT inhaler Inhale 1 puff into the lungs every 4 (four) hours as needed for wheezing.   omeprazole  (PRILOSEC) 20 MG capsule Take 1 capsule (20 mg total) by mouth 2 (two) times daily before a meal.   Probiotic Product (ALIGN) 4 MG CAPS Take 1 capsule by mouth daily as needed (upset stomach).   vitamin B-12 (CYANOCOBALAMIN ) 1000 MCG tablet Take 1,000 mcg by mouth daily.   WIXELA INHUB 250-50 MCG/ACT AEPB    zolpidem  (AMBIEN ) 5 MG tablet TAKE 1/2 TO 1 (ONE-HALF TO ONE) TABLET BY MOUTH AT BEDTIME   No facility-administered encounter medications on file as of 02/06/2024.    Allergies (verified) Levofloxacin and Tessalon  [benzonatate ]   History: Past Medical History:  Diagnosis Date   Allergic rhinitis    Allergy    Anemia 02/15/2023   Arthritis    Asthma    WELL CONTROLLED   Barrett's esophagus 2005   Complication of anesthesia    GERD (gastroesophageal reflux disease) 11/2008   Hemorrhoids    Hiatal hernia    SMALL   History of chicken pox    IBS (irritable bowel syndrome)    Osteopenia    Peptic stricture of esophagus 11/2008   PONV (postoperative nausea and vomiting)    N/V WITH GALLBLADDER   Recurrent sinusitis    Past Surgical History:  Procedure Laterality Date   BREAST BIOPSY Right 07/26/2017   INTRADUCTAL PAPILLOMA.    BREAST BIOPSY Left 08/27/2020   affirm bx #1-"X" clip-   BREAST BIOPSY Left 08/27/2020   affirm bx-#2-"coil: clip-   BREAST BIOPSY Right 10/13/2021   us  bx, heart marker, FIBROEPITHELIAL PROLIFERATION WITH SCLEROSIS AND USUAL DUCTAL HYPERPLASIA   BREAST BIOPSY Right 10/29/2021   right stereo bx X clip - FIBROEPITHELIAL PROLIFERATION WITH SCLEROSIS   BREAST BIOPSY WITH RADIO FREQUENCY LOCALIZER Left  09/26/2020   Procedure: BREAST BIOPSY WITH RADIO FREQUENCY LOCALIZER;  Surgeon: Eldred Grego, MD;  Location: [facility] ORS;  Service: General;  Laterality: Left;   BREAST LUMPECTOMY Right 08/2017   BREAST LUMPECTOMY WITH NEEDLE LOCALIZATION Right 09/09/2017   Procedure: BREAST LUMPECTOMY WITH NEEDLE LOCALIZATION;  Surgeon: Benancio Bracket, MD;  Location: [facility] ORS;  Service: General;  Laterality: Right;   CHOLECYSTECTOMY  2010   TUBAL LIGATION     Family History  Problem Relation Age of Onset   Aortic aneurysm Mother    Heart disease Mother    Heart attack Brother        early 51's   Arthritis Brother    Breast cancer Sister 71   Colon cancer Neg Hx    Social History   Socioeconomic History   Marital status: Divorced    Spouse name: Not on file   Number of children: 2   Years of education: Not on file   Highest education level: 12th grade  Occupational History   Occupation: Scientist, physiological for pediatrician  Tobacco Use  Smoking status: Never   Smokeless tobacco: Never  Vaping Use   Vaping status: Never Used  Substance and Sexual Activity   Alcohol use: Not Currently    Comment: maybe 2 or 3 drinks a year   Drug use: Never   Sexual activity: Not Currently  Other Topics Concern   Not on file  Social History Narrative   Daily caffeine use - soft drinks/tea       Separating from husband 1/17   Social Drivers of Health   Financial Resource Strain: Low Risk  (02/06/2024)   Overall Financial Resource Strain (CARDIA)    Difficulty of Paying Living Expenses: Not very hard  Food Insecurity: No Food Insecurity (02/06/2024)   Hunger Vital Sign    Worried About Running Out of Food in the Last Year: Never true    Ran Out of Food in the Last Year: Never true  Transportation Needs: No Transportation Needs (02/06/2024)   PRAPARE - Administrator, Civil Service (Medical): No    Lack of Transportation (Non-Medical): No  Physical Activity: Insufficiently Active  (02/06/2024)   Exercise Vital Sign    Days of Exercise per Week: 2 days    Minutes of Exercise per Session: 20 min  Stress: No Stress Concern Present (02/06/2024)   [employer] of Occupational Health - Occupational Stress Questionnaire    Feeling of Stress : Only a little  Social Connections: Moderately Isolated (02/06/2024)   Social Connection and Isolation Panel [NHANES]    Frequency of Communication with Friends and Family: Three times a week    Frequency of Social Gatherings with Friends and Family: Once a week    Attends Religious Services: More than 4 times per year    Active Member of [employer] or Organizations: No    Attends Engineer, structural: Never    Marital Status: Divorced    Tobacco Counseling Counseling given: Not Answered    Clinical Intake:  Pre-visit preparation completed: Yes  Pain : No/denies pain     BMI - recorded: 25.58 Nutritional Status: BMI 25 -29 Overweight Nutritional Risks: None Diabetes: No  No results found for: "HGBA1C"   How often do you need to have someone help you when you read instructions, pamphlets, or other written materials from your doctor or pharmacy?: 1 - Never What is the last grade level you completed in school?: HS Graduate     Information entered by :: Ari Bernabei,CMA   Activities of Daily Living     02/02/2024   10:31 AM  In your present state of health, do you have any difficulty performing the following activities:  Hearing? 1  Vision? 0  Difficulty concentrating or making decisions? 0  Walking or climbing stairs? 0  Dressing or bathing? 0  Doing errands, shopping? 0  Preparing Food and eating ? N  Using the Toilet? N  In the past six months, have you accidently leaked urine? N  Do you have problems with loss of bowel control? N  Managing your Medications? N  Managing your Finances? N  Housekeeping or managing your Housekeeping? N    Patient Care Team: Tower, Manley Seeds, MD as PCP - General  (Family Medicine) Asencion Blacksmith, MD (Inactive) as Consulting Physician (Gastroenterology)  I have updated your Care Teams any recent Medical Services you may have received from other providers in the past year.     Assessment:    This is a routine wellness examination for Britley.  Hearing/Vision screen  Hearing Screening - Comments:: Patient has no hearing difficulties  Vision Screening - Comments:: Patient wears glasses    Goals Addressed             This Visit's Progress    Patient Stated   On track    Lose weight       Depression Screen     02/06/2024   10:34 AM 01/27/2024    7:49 AM 02/02/2023   10:22 AM 09/15/2022    3:06 PM 02/01/2022    2:29 PM 01/21/2022   11:15 AM 05/27/2021    2:44 PM  PHQ 2/9 Scores  PHQ - 2 Score 0 0 0 0 0 0 0  PHQ- 9 Score 2   1       Fall Risk     02/06/2024   10:33 AM 02/02/2024   10:31 AM 01/27/2024    7:49 AM 02/02/2023   10:23 AM 01/29/2023   11:18 AM  Fall Risk   Falls in the past year? 0 1 0 0 0  Number falls in past yr: 0  0 0   Injury with Fall? 0  0 0   Risk for fall due to : No Fall Risks  No Fall Risks No Fall Risks   Follow up Falls evaluation completed  Falls evaluation completed Falls prevention discussed;Falls evaluation completed     MEDICARE RISK AT HOME:  Medicare Risk at Home Any stairs in or around the home?: (Patient-Rptd) Yes If so, are there any without handrails?: (Patient-Rptd) Yes Home free of loose throw rugs in walkways, pet beds, electrical cords, etc?: (Patient-Rptd) Yes Adequate lighting in your home to reduce risk of falls?: (Patient-Rptd) Yes Life alert?: (Patient-Rptd) No Use of a cane, walker or w/c?: (Patient-Rptd) No Grab bars in the bathroom?: (Patient-Rptd) No Shower chair or bench in shower?: (Patient-Rptd) No Elevated toilet seat or a handicapped toilet?: (Patient-Rptd) No  TIMED UP AND GO:  Was the test performed?  No  Cognitive Function: 6CIT completed    01/20/2021   11:17 AM 01/04/2020     2:45 PM 01/01/2019   10:26 AM 12/22/2017   10:15 AM  MMSE - Mini Mental State Exam  Orientation to time 5 5 5 5   Orientation to Place 5 5 5 5   Registration 3 3 3 3   Attention/ Calculation 5 5 0 0  Recall 3 3 3 3   Language- name 2 objects   0 0  Language- repeat 1 1 1 1   Language- follow 3 step command   0 3  Language- read & follow direction   0 0  Write a sentence   0 0  Copy design   0 0  Total score   17 20        02/06/2024   10:31 AM 02/02/2023   10:23 AM 01/21/2022   11:21 AM  6CIT Screen  What Year? 0 points 0 points 0 points  What month? 0 points 0 points 0 points  What time? 0 points 0 points 0 points  Count back from 20 0 points 0 points 0 points  Months in reverse 0 points 0 points 0 points  Repeat phrase 0 points 0 points 0 points  Total Score 0 points 0 points 0 points    Immunizations Immunization History  Administered Date(s) Administered   Fluad Quad(high Dose 65+) 06/17/2022   Fluad Trivalent(High Dose 65+) 06/21/2023   Influenza Split 06/21/2012   Influenza,inj,Quad PF,6+ Mos 06/13/2013, 07/01/2014, 06/16/2015, 06/25/2016, 06/17/2017, 06/29/2018,  06/19/2019, 05/21/2020, 06/26/2021   PFIZER(Purple Top)SARS-COV-2 Vaccination 10/10/2019, 10/31/2019, 08/03/2020   Pneumococcal Conjugate-13 12/21/2016   Pneumococcal Polysaccharide-23 01/27/2021   Tdap 05/25/2011    Screening Tests Health Maintenance  Topic Date Due   Hepatitis C Screening  Never done   Zoster Vaccines- Shingrix (1 of 2) Never done   DTaP/Tdap/Td (2 - Td or Tdap) 05/24/2021   COVID-19 Vaccine (4 - 2024-25 season) 05/01/2023   INFLUENZA VACCINE  03/30/2024   DEXA SCAN  03/31/2024   MAMMOGRAM  10/06/2024   Medicare Annual Wellness (AWV)  02/05/2025   Pneumonia Vaccine 2+ Years old  Completed   HPV VACCINES  Aged Out   Meningococcal B Vaccine  Aged Out   Colonoscopy  Discontinued    Health Maintenance  Health Maintenance Due  Topic Date Due   Hepatitis C Screening  Never done    Zoster Vaccines- Shingrix (1 of 2) Never done   DTaP/Tdap/Td (2 - Td or Tdap) 05/24/2021   COVID-19 Vaccine (4 - 2024-25 season) 05/01/2023   Health Maintenance Items Addressed:   Additional Screening:  Vision Screening: Recommended annual ophthalmology exams for early detection of glaucoma and other disorders of the eye. Would you like a referral to an eye doctor? No    Dental Screening: Recommended annual dental exams for proper oral hygiene  Community Resource Referral / Chronic Care Management: CRR required this visit?  No   CCM required this visit?  No   Plan:    I have personally reviewed and noted the following in the patient's chart:   Medical and social history Use of alcohol, tobacco or illicit drugs  Current medications and supplements including opioid prescriptions. Patient is not currently taking opioid prescriptions. Functional ability and status Nutritional status Physical activity Advanced directives List of other physicians Hospitalizations, surgeries, and ER visits in previous 12 months Vitals Screenings to include cognitive, depression, and falls Referrals and appointments  In addition, I have reviewed and discussed with patient certain preventive protocols, quality metrics, and best practice recommendations. A written personalized care plan for preventive services as well as general preventive health recommendations were provided to patient.   Freeda Jerry, [place]   02/06/2024   After Visit Summary: (MyChart) Due to this being a telephonic visit, the after visit summary with patients personalized plan was offered to patient via MyChart   Notes: Nothing significant to report at this time.

## 2024-02-07 ENCOUNTER — Other Ambulatory Visit: Payer: Medicare HMO

## 2024-02-17 ENCOUNTER — Encounter: Payer: Self-pay | Admitting: Family Medicine

## 2024-02-17 ENCOUNTER — Ambulatory Visit (INDEPENDENT_AMBULATORY_CARE_PROVIDER_SITE_OTHER): Payer: Medicare HMO | Admitting: Family Medicine

## 2024-02-17 ENCOUNTER — Ambulatory Visit: Payer: Self-pay | Admitting: Family Medicine

## 2024-02-17 ENCOUNTER — Ambulatory Visit (INDEPENDENT_AMBULATORY_CARE_PROVIDER_SITE_OTHER)
Admission: RE | Admit: 2024-02-17 | Discharge: 2024-02-17 | Disposition: A | Discharge status: Home / Self Care | Source: Ambulatory Visit | Attending: Family Medicine | Admitting: Family Medicine

## 2024-02-17 VITALS — BP 124/80 | HR 73 | Temp 97.8°F | Ht 63.75 in | Wt 146.0 lb

## 2024-02-17 DIAGNOSIS — R002 Palpitations: Secondary | ICD-10-CM | POA: Diagnosis not present

## 2024-02-17 DIAGNOSIS — R058 Other specified cough: Secondary | ICD-10-CM | POA: Diagnosis not present

## 2024-02-17 DIAGNOSIS — R509 Fever, unspecified: Secondary | ICD-10-CM | POA: Diagnosis not present

## 2024-02-17 MED ORDER — AMOXICILLIN-POT CLAVULANATE 875-125 MG PO TABS: 1.0000 | ORAL_TABLET | Freq: Two times a day (BID) | ORAL | 0 refills | Status: DC

## 2024-02-17 MED ORDER — PREDNISONE 10 MG PO TABS
ORAL_TABLET | ORAL | 0 refills | Status: DC
Start: 2024-02-17 — End: 2024-03-27

## 2024-02-17 NOTE — Assessment & Plan Note (Signed)
 Reviewed Dr Alger Infield notes  Overall since that visit pt has had less palpitations but they are not gone Normal TSH She does avoid caffeine   May be dehydrated-dislikes drinking fluid in general Encouraged to try and drink more (in fact is sick currently and plans to push fluids)   Would consider monitor and/ or cardiology visit if not improved with better fluids

## 2024-02-17 NOTE — Progress Notes (Signed)
 Subjective:    Patient ID: Jenny Mcfarland, female    DOB: November 22, 1950, 73 y.o.   MRN: [id number]  HPI  Wt Readings from Last 3 Encounters:  02/17/24 146 lb (66.2 kg)  02/06/24 149 lb (67.6 kg)  01/27/24 149 lb (67.6 kg)   25.26 kg/m  Vitals:   02/17/24 1425  BP: 124/80  Pulse: 73  Temp: 97.8 F (36.6 C)  SpO2: 100%    Pt presents for c/o uri symptoms (had to change from annual exam appointment)  Fever  Productive cough Wheezing  No appetite Headache   Started 5-6 days ago  Sunday felt tired/ some change in her breathing  Had chills /fever Monday night then sweats   No nasal symptoms No ear symptoms   Some hoarserness from the cough   Prod cough-phlegm - tastes terrible Achey all over   Some shortness of breath   She has a history of asthma  Ventolin  Also albuterol nmt -using regularly  Wixhela inhaler bid  Yesterday first day of no fever   Had 2 neg covid tests    Tylenol   Advil    Notes she had a visit for heart fluttering on 5/30   No racing heart and has low resting heart rate Symptoms coming out of sleep and suggests PAC due to lower pacer rate No exertional symptoms Will check EKG and labs---just in case' EKG shows sinus brady at 53. Normal axis and intervals. Diffuse mild ST changes but no change from 2022 Reassured--unless has exercise symptoms--no action should be needed     Has happened several more times Less than it was  Not a good fluid drinker   Avoids caffeine   Lab Results  Component Value Date   TSH 2.47 01/27/2024    Cxr  DG Chest 2 View Result Date: 02/17/2024 CLINICAL DATA:  I productive cough with fever EXAM: CHEST - 2 VIEW COMPARISON:  10/20/2009 FINDINGS: Normal mediastinum and cardiac silhouette. Normal pulmonary vasculature. No evidence of effusion, infiltrate, or pneumothorax. No acute bony abnormality. IMPRESSION: No acute cardiopulmonary process. Electronically Signed   By: Deboraha Fallow M.D.   On:  02/17/2024 17:50       Patient Active Problem List   Diagnosis Date Noted   Productive cough 02/17/2024   Palpitations 01/27/2024   Anemia 02/15/2023   Abnormality on screening test 09/15/2022   Family history of early CAD 09/15/2022   Atypical lobular hyperplasia of breast 02/01/2022   Routine general medical examination at a health care facility 01/21/2022   Abnormal endometrial ultrasound 03/03/2021   Hearing loss 01/27/2021   Post-menopausal bleeding 01/27/2021   Vitamin B12 deficiency 01/19/2021   Current use of proton pump inhibitor 01/18/2021   Welcome to Medicare preventive visit 12/21/2016   Estrogen deficiency 06/04/2015   Vitamin D  deficiency 09/23/2014   Thoracic or lumbosacral neuritis or radiculitis, unspecified 05/21/2013   Encounter for gynecological examination 05/25/2011   Encounter for general adult medical examination with abnormal findings 05/25/2011   Osteopenia 12/27/2010   IBS (irritable bowel syndrome) 12/25/2010   Allergic rhinitis 12/25/2010   Asthma 12/25/2010   Mild hyperlipidemia 12/25/2010   Family history of breast cancer 12/25/2010   Family history of aortic aneurysm 12/25/2010   GERD 11/08/2008   Barrett's esophagus 11/08/2008   Past Medical History:  Diagnosis Date   Allergic rhinitis    Allergy    Anemia 02/15/2023   Arthritis    Asthma    WELL CONTROLLED   Barrett's  esophagus 2005   Complication of anesthesia    GERD (gastroesophageal reflux disease) 11/2008   Hemorrhoids    Hiatal hernia    SMALL   History of chicken pox    IBS (irritable bowel syndrome)    Osteopenia    Peptic stricture of esophagus 11/2008   PONV (postoperative nausea and vomiting)    N/V WITH GALLBLADDER   Recurrent sinusitis    Past Surgical History:  Procedure Laterality Date   BREAST BIOPSY Right 07/26/2017   INTRADUCTAL PAPILLOMA.    BREAST BIOPSY Left 08/27/2020   affirm bx #1-X clip-   BREAST BIOPSY Left 08/27/2020   affirm bx-#2-coil:  clip-   BREAST BIOPSY Right 10/13/2021   us  bx, heart marker, FIBROEPITHELIAL PROLIFERATION WITH SCLEROSIS AND USUAL DUCTAL HYPERPLASIA   BREAST BIOPSY Right 10/29/2021   right stereo bx X clip - FIBROEPITHELIAL PROLIFERATION WITH SCLEROSIS   BREAST BIOPSY WITH RADIO FREQUENCY LOCALIZER Left 09/26/2020   Procedure: BREAST BIOPSY WITH RADIO FREQUENCY LOCALIZER;  Surgeon: Eldred Grego, MD;  Location: [facility];  Service: General;  Laterality: Left;   BREAST LUMPECTOMY Right 08/2017   BREAST LUMPECTOMY WITH NEEDLE LOCALIZATION Right 09/09/2017   Procedure: BREAST LUMPECTOMY WITH NEEDLE LOCALIZATION;  Surgeon: Benancio Bracket, MD;  Location: [facility];  Service: General;  Laterality: Right;   CHOLECYSTECTOMY  2010   TUBAL LIGATION     Social History   Tobacco Use   Smoking status: Never   Smokeless tobacco: Never  Vaping Use   Vaping status: Never Used  Substance Use Topics   Alcohol use: Not Currently    Comment: maybe 2 or 3 drinks a year   Drug use: Never   Family History  Problem Relation Age of Onset   Aortic aneurysm Mother    Heart disease Mother    Heart attack Brother        early 75's   Arthritis Brother    Breast cancer Sister 28   Arthritis Sister    Colon cancer Neg Hx    Allergies  Allergen Reactions   Levofloxacin Itching and Other (See Comments)    Felt like she had bugs crawling all over her.   Tessalon  [Benzonatate ] Nausea And Vomiting    Made GERD worse   Current Outpatient Medications on File Prior to Visit  Medication Sig Dispense Refill   albuterol (VENTOLIN HFA) 108 (90 Base) MCG/ACT inhaler Inhale 2 puffs into the lungs every 6 (six) hours as needed for wheezing or shortness of breath.     Cholecalciferol (VITAMIN D3) 1000 units CAPS Take by mouth. Pt taking 1 every other day     dicyclomine  (BENTYL ) 10 MG capsule Take 1 capsule (10 mg total) by mouth 3 (three) times daily as needed. 90 capsule 11   fexofenadine (ALLEGRA) 180 MG tablet  Take 180 mg by mouth every morning.     omeprazole  (PRILOSEC) 20 MG capsule Take 1 capsule (20 mg total) by mouth 2 (two) times daily before a meal. 60 capsule 11   Probiotic Product (ALIGN) 4 MG CAPS Take 1 capsule by mouth daily as needed (upset stomach).     vitamin B-12 (CYANOCOBALAMIN ) 1000 MCG tablet Take 1,000 mcg by mouth daily.     WIXELA INHUB 250-50 MCG/ACT AEPB      zolpidem  (AMBIEN ) 5 MG tablet TAKE 1/2 TO 1 (ONE-HALF TO ONE) TABLET BY MOUTH AT BEDTIME 30 tablet 3   No current facility-administered medications on file prior to visit.    Review  of Systems  Constitutional:  Positive for fatigue and fever. Negative for activity change, appetite change and unexpected weight change.  HENT:  Negative for congestion, ear pain, rhinorrhea, sinus pressure and sore throat.   Eyes:  Negative for pain, redness and visual disturbance.  Respiratory:  Positive for cough, shortness of breath and wheezing.   Cardiovascular:  Positive for palpitations. Negative for chest pain.  Gastrointestinal:  Negative for abdominal pain, blood in stool, constipation and diarrhea.  Endocrine: Negative for polydipsia and polyuria.  Genitourinary:  Negative for dysuria, frequency and urgency.  Musculoskeletal:  Negative for arthralgias, back pain and myalgias.  Skin:  Negative for pallor and rash.  Allergic/Immunologic: Negative for environmental allergies.  Neurological:  Negative for dizziness, syncope and headaches.  Hematological:  Negative for adenopathy. Does not bruise/bleed easily.  Psychiatric/Behavioral:  Negative for decreased concentration and dysphoric mood. The patient is not nervous/anxious.        Objective:   Physical Exam Constitutional:      General: She is not in acute distress.    Appearance: She is well-developed and normal weight. She is not ill-appearing.  HENT:     Head: Normocephalic and atraumatic.     Right Ear: Tympanic membrane and ear canal normal.     Left Ear: Tympanic  membrane and ear canal normal.     Nose: Nose normal.     Mouth/Throat:     Mouth: Mucous membranes are moist.     Pharynx: Oropharynx is clear. No oropharyngeal exudate or posterior oropharyngeal erythema.   Eyes:     General:        Right eye: No discharge.        Left eye: No discharge.     Conjunctiva/sclera: Conjunctivae normal.     Pupils: Pupils are equal, round, and reactive to light.   Neck:     Thyroid : No thyromegaly.     Vascular: No carotid bruit or JVD.   Cardiovascular:     Rate and Rhythm: Normal rate and regular rhythm.     Heart sounds: Normal heart sounds.     No gallop.  Pulmonary:     Effort: Pulmonary effort is normal. No respiratory distress.     Breath sounds: No stridor. Wheezing and rhonchi present. No rales.     Comments: Harsh bs  Scattered exp wheeze worse on forced exp  Moderate rhonchi   No rales or crackles Upper airway sounds cleared by cough Abdominal:     General: There is no distension or abdominal bruit.     Palpations: Abdomen is soft.   Musculoskeletal:     Cervical back: Normal range of motion and neck supple.     Right lower leg: No edema.     Left lower leg: No edema.  Lymphadenopathy:     Cervical: No cervical adenopathy.   Skin:    General: Skin is warm and dry.     Coloration: Skin is not pale.     Findings: No rash.   Neurological:     Mental Status: She is alert.     Coordination: Coordination normal.     Deep Tendon Reflexes: Reflexes are normal and symmetric. Reflexes normal.   Psychiatric:        Mood and Affect: Mood normal.           Assessment & Plan:   Problem List Items Addressed This Visit       Other   Productive cough - Primary   With  fever 5-6 days  In pt with asthma  Neg covid testing times 2 Overall reassuring exam  =some wheeze and upper airway sounds  Cxr ordered : no acute cardiopulm process  Reassuring   Augmentin bid 10 d Prednisone  40 mg tape / aware of side  effects  Delsym and mucinex or mucinex dm  Increase fluids       Relevant Orders   DG Chest 2 View (Completed)   Palpitations   Reviewed Dr Alger Infield notes  Overall since that visit pt has had less palpitations but they are not gone Normal TSH She does avoid caffeine   May be dehydrated-dislikes drinking fluid in general Encouraged to try and drink more (in fact is sick currently and plans to push fluids)   Would consider monitor and/ or cardiology visit if not improved with better fluids

## 2024-02-17 NOTE — Assessment & Plan Note (Addendum)
 With fever 5-6 days  In pt with asthma  Neg covid testing times 2 Overall reassuring exam  =some wheeze and upper airway sounds  Cxr ordered : no acute cardiopulm process  Reassuring   Augmentin bid 10 d Prednisone  40 mg tape / aware of side effects  Delsym and mucinex or mucinex dm  Increase fluids

## 2024-02-17 NOTE — Patient Instructions (Addendum)
 Drink fluids and rest  mucinex DM is good for cough and congestion  (or plain mucinex and delsym)  Tylenol  for fever or pain or headache  Please alert us  if symptoms worsen (if severe or short of breath please go to the ER)   Chest xray now  We will reach out with results   Take augmentin and prednisone  as directed   Update if not starting to improve in a week or if worsening   If severe -go to the ER

## 2024-03-08 DIAGNOSIS — H40023 Open angle with borderline findings, high risk, bilateral: Secondary | ICD-10-CM | POA: Diagnosis not present

## 2024-03-08 DIAGNOSIS — H524 Presbyopia: Secondary | ICD-10-CM | POA: Diagnosis not present

## 2024-03-08 DIAGNOSIS — H2513 Age-related nuclear cataract, bilateral: Secondary | ICD-10-CM | POA: Diagnosis not present

## 2024-03-08 DIAGNOSIS — H5203 Hypermetropia, bilateral: Secondary | ICD-10-CM | POA: Diagnosis not present

## 2024-03-08 DIAGNOSIS — Z83511 Family history of glaucoma: Secondary | ICD-10-CM | POA: Diagnosis not present

## 2024-03-08 DIAGNOSIS — H25013 Cortical age-related cataract, bilateral: Secondary | ICD-10-CM | POA: Diagnosis not present

## 2024-03-27 ENCOUNTER — Other Ambulatory Visit: Payer: Self-pay

## 2024-03-27 ENCOUNTER — Ambulatory Visit (INDEPENDENT_AMBULATORY_CARE_PROVIDER_SITE_OTHER): Admitting: Family Medicine

## 2024-03-27 ENCOUNTER — Encounter: Payer: Self-pay | Admitting: Family Medicine

## 2024-03-27 VITALS — BP 124/70 | HR 63 | Temp 97.7°F | Ht 63.75 in | Wt 149.0 lb

## 2024-03-27 DIAGNOSIS — Z0000 Encounter for general adult medical examination without abnormal findings: Secondary | ICD-10-CM | POA: Diagnosis not present

## 2024-03-27 DIAGNOSIS — H6123 Impacted cerumen, bilateral: Secondary | ICD-10-CM

## 2024-03-27 DIAGNOSIS — Z79899 Other long term (current) drug therapy: Secondary | ICD-10-CM | POA: Diagnosis not present

## 2024-03-27 DIAGNOSIS — E785 Hyperlipidemia, unspecified: Secondary | ICD-10-CM

## 2024-03-27 DIAGNOSIS — E2839 Other primary ovarian failure: Secondary | ICD-10-CM

## 2024-03-27 DIAGNOSIS — M8589 Other specified disorders of bone density and structure, multiple sites: Secondary | ICD-10-CM

## 2024-03-27 DIAGNOSIS — E559 Vitamin D deficiency, unspecified: Secondary | ICD-10-CM | POA: Diagnosis not present

## 2024-03-27 DIAGNOSIS — K2100 Gastro-esophageal reflux disease with esophagitis, without bleeding: Secondary | ICD-10-CM

## 2024-03-27 DIAGNOSIS — E538 Deficiency of other specified B group vitamins: Secondary | ICD-10-CM | POA: Diagnosis not present

## 2024-03-27 DIAGNOSIS — H6120 Impacted cerumen, unspecified ear: Secondary | ICD-10-CM | POA: Insufficient documentation

## 2024-03-27 DIAGNOSIS — R002 Palpitations: Secondary | ICD-10-CM | POA: Diagnosis not present

## 2024-03-27 MED ORDER — BOOSTRIX 5-2.5-18.5 LF-MCG/0.5 IM SUSY
PREFILLED_SYRINGE | INTRAMUSCULAR | 0 refills | Status: AC
  Filled 2024-03-27: qty 0.5, 1d supply, fill #0

## 2024-03-27 NOTE — Assessment & Plan Note (Signed)
 Last vitamin D  Lab Results  Component Value Date   VD25OH 25.87 (L) 01/27/2024   Encouraged pt to be more compliant with her daily D supplement for bone and overall health

## 2024-03-27 NOTE — Assessment & Plan Note (Signed)
 Continues omeprazole  20 mg bid Watching B12 and D levels  Encouraged to avoid triggers

## 2024-03-27 NOTE — Assessment & Plan Note (Signed)
 Reviewed health habits including diet and exercise and skin cancer prevention Reviewed appropriate screening tests for age  Also reviewed health mt list, fam hx and immunization status , as well as social and family history   See HPI Labs reviewed and ordered Health Maintenance  Topic Date Due   Hepatitis C Screening  Never done   DTaP/Tdap/Td vaccine (2 - Td or Tdap) 05/24/2021   Zoster (Shingles) Vaccine (1 of 2) 06/27/2024*   COVID-19 Vaccine (5 - 2024-25 season) 04/12/2026*   Flu Shot  03/30/2024   DEXA scan (bone density measurement)  03/31/2024   Mammogram  10/06/2024   Medicare Annual Wellness Visit  02/05/2025   Pneumococcal Vaccine for age over 56  Completed   Hepatitis B Vaccine  Aged Out   HPV Vaccine  Aged Out   Meningitis B Vaccine  Aged Out   Colon Cancer Screening  Discontinued  *Topic was postponed. The date shown is not the original due date.    Tdap given Declines shingrix  Dexa ordered Discussed fall prevention, supplements and exercise for bone density  No falls or fractures PHQ 1

## 2024-03-27 NOTE — Assessment & Plan Note (Signed)
 Disc goals for lipids and reasons to control them Rev last labs with pt Rev low sat fat diet in detail  Overall fairly stable Great HDL in 80s LDL 119  ASCVD 89.1%

## 2024-03-27 NOTE — Patient Instructions (Addendum)
 I put the referral in for cardiology for your palpitations  Please let us  know if you don't hear in 1-2 weeks to set that up (that may be in [place])   Stay active  Add some strength training to your routine, this is important for bone and brain health and can reduce your risk of falls and help your body use insulin properly and regulate weight  Light weights, exercise bands , and internet videos are a good way to start  Yoga (chair or regular), machines , floor exercises or a gym with machines are also good options    Take your vitamin D  every single day  Put in your pill box   Get some debrox over the counter for ear wax  Use twice weekly  If ear wax does not come out in a month - make an appointment to come in for ear flush/irrigation   Tetanus shot today   You have an order for:  []   3D Mammogram  [x]   Bone Density     Please call for appointment:   [x]   [facility]  [address] [place] [address]  [phone]  []   [facility])   [address]  [place], [place] [address]  [phone]  []   [facility]      [address] [place], [place]        [phone]         []   [facility]  [address] [place], [place]  [phone]  []  [facility]   [address]   [place], [place] [address]  [phone]  []  [facility]  [address] [place], [place] [address] [phone]    Make sure to wear two piece clothing  No lotions powders or deodorants the day of the appointment Make sure to bring picture ID and insurance card.  Bring list of medications you are currently taking including any supplements.   Schedule your screening mammogram through MyChart!   Select [facility] imaging sites can now be  scheduled through MyChart.  Log into your MyChart account.  Go to 'Visit' (or 'Appointments' if  on mobile App) --> Schedule an  Appointment  Under 'Select a Reason for Visit' choose the Mammogram  Screening option.  Complete the pre-visit questions  and select the time and place that  best fits your schedule

## 2024-03-27 NOTE — Assessment & Plan Note (Signed)
 Lab Results  Component Value Date   VITAMINB12 989 (H) 01/27/2024   Instructed to take B12 every other day or cut dose in 1/2

## 2024-03-27 NOTE — Progress Notes (Signed)
 Subjective:    Patient ID: Jenny Mcfarland, female    DOB: 01-Dec-1950, 73 y.o.   MRN: [id number]  HPI  Here for health maintenance exam and to review chronic medical problems   Wt Readings from Last 3 Encounters:  03/27/24 149 lb (67.6 kg)  02/17/24 146 lb (66.2 kg)  02/06/24 149 lb (67.6 kg)   25.78 kg/m  Vitals:   03/27/24 0950  BP: 124/70  Pulse: 63  Temp: 97.7 F (36.5 C)  SpO2: 97%    Immunization History  Administered Date(s) Administered   Fluad Quad(high Dose 65+) 06/17/2022   Fluad Trivalent(High Dose 65+) 06/21/2023   Influenza Split 06/21/2012   Influenza,inj,Quad PF,6+ Mos 06/13/2013, 07/01/2014, 06/16/2015, 06/25/2016, 06/17/2017, 06/29/2018, 06/19/2019, 05/21/2020, 06/26/2021, 09/15/2023   PFIZER(Purple Top)SARS-COV-2 Vaccination 10/10/2019, 10/31/2019, 05/08/2020, 08/03/2020   Pneumococcal Conjugate-13 12/21/2016   Pneumococcal Polysaccharide-23 06/15/2018, 05/08/2020, 01/27/2021, 05/14/2021, 09/09/2022, 09/15/2023   Tdap 05/25/2011    Health Maintenance Due  Topic Date Due   Hepatitis C Screening  Never done   DTaP/Tdap/Td (2 - Td or Tdap) 05/24/2021   Feeling good overall  Still has palpitations Trying to drink more fluids-doing better  Avoiding caffeine   Lab Results  Component Value Date   TSH 2.47 01/27/2024     Tetanus shot - wants today (pharmacy service today)   Shingrix -- declines   Mammogram 10/2023  Self breast exam- no lumps   Gyn health No problems No bleeding   Colon cancer screening -colonoscopy 05/2023   Bone health  Dexa 03/2022 osteopenia   [place]  Falls- none  Fractures-none  Supplements -takes vitamin D  but misses days    Last vitamin D  Lab Results  Component Value Date   VD25OH 25.87 (L) 01/27/2024    Exercise : not as active this summer  Lifts weights sometimes     Mood    03/27/2024   10:00 AM 02/17/2024    2:31 PM 02/06/2024   10:34 AM 01/27/2024    7:49 AM 02/02/2023   10:22 AM   Depression screen PHQ 2/9  Decreased Interest 0 0 0 0 0  Down, Depressed, Hopeless 0 0 0 0 0  PHQ - 2 Score 0 0 0 0 0  Altered sleeping 1 0 2    Tired, decreased energy 0 0 0    Change in appetite 0 0 0    Feeling bad or failure about yourself  0 0 0    Trouble concentrating 0 0 0    Moving slowly or fidgety/restless 0 0 0    Suicidal thoughts 0 0 0    PHQ-9 Score 1 0 2    Difficult doing work/chores Not difficult at all Not difficult at all Not difficult at all      Ppi use with B12 def  Lab Results  Component Value Date   VITAMINB12 989 (H) 01/27/2024    Hyperlipidemia  Lab Results  Component Value Date   CHOL 220 (H) 01/27/2024   CHOL 207 (H) 02/01/2023   CHOL 199 01/22/2022   Lab Results  Component Value Date   HDL 83.60 01/27/2024   HDL 77.70 02/01/2023   HDL 72.70 01/22/2022   Lab Results  Component Value Date   LDLCALC 119 (H) 01/27/2024   LDLCALC 113 (H) 02/01/2023   LDLCALC 109 (H) 01/22/2022   Lab Results  Component Value Date   TRIG 89.0 01/27/2024   TRIG 80.0 02/01/2023   TRIG 88.0 01/22/2022   Lab Results  Component  Value Date   CHOLHDL 3 01/27/2024   CHOLHDL 3 02/01/2023   CHOLHDL 3 01/22/2022   Lab Results  Component Value Date   LDLDIRECT 135.4 09/17/2013   LDLDIRECT 125.5 09/11/2012   LDLDIRECT 121.7 05/25/2011   Was not eating well/not as good as normal   The 10-year ASCVD risk score (Arnett DK, et al., 2019) is: 10.8%   Values used to calculate the score:     Age: 70 years     Clincally relevant sex: Female     Is Non-Hispanic African American: No     Diabetic: No     Tobacco smoker: No     Systolic Blood Pressure: 124 mmHg     Is BP treated: No     HDL Cholesterol: 83.6 mg/dL     Total Cholesterol: 220 mg/dL   Lab Results  Component Value Date   NA 143 01/27/2024   K 3.9 01/27/2024   CO2 31 01/27/2024   GLUCOSE 94 01/27/2024   BUN 8 01/27/2024   CREATININE 0.83 01/27/2024   CALCIUM 9.8 01/27/2024   GFR 70.51  01/27/2024   GFRNONAA >60 12/10/2008   Lab Results  Component Value Date   ALT 9 01/27/2024   AST 13 01/27/2024   ALKPHOS 74 01/27/2024   BILITOT 0.9 01/27/2024    Lab Results  Component Value Date   TSH 2.47 01/27/2024      Patient Active Problem List   Diagnosis Date Noted   Cerumen impaction 03/27/2024   Productive cough 02/17/2024   Palpitations 01/27/2024   Anemia 02/15/2023   Abnormality on screening test 09/15/2022   Family history of early CAD 09/15/2022   Atypical lobular hyperplasia of breast 02/01/2022   Routine general medical examination at a health care facility 01/21/2022   Abnormal endometrial ultrasound 03/03/2021   Hearing loss 01/27/2021   Post-menopausal bleeding 01/27/2021   Vitamin B12 deficiency 01/19/2021   Current use of proton pump inhibitor 01/18/2021   Welcome to Medicare preventive visit 12/21/2016   Estrogen deficiency 06/04/2015   Vitamin D  deficiency 09/23/2014   Thoracic or lumbosacral neuritis or radiculitis, unspecified 05/21/2013   Encounter for gynecological examination 05/25/2011   Encounter for general adult medical examination with abnormal findings 05/25/2011   Osteopenia 12/27/2010   IBS (irritable bowel syndrome) 12/25/2010   Allergic rhinitis 12/25/2010   Asthma 12/25/2010   Mild hyperlipidemia 12/25/2010   Family history of breast cancer 12/25/2010   Family history of aortic aneurysm 12/25/2010   GERD 11/08/2008   Past Medical History:  Diagnosis Date   Allergic rhinitis    Allergy    Anemia 02/15/2023   Arthritis    Asthma    WELL CONTROLLED   Barrett's esophagus 2005   Complication of anesthesia    GERD (gastroesophageal reflux disease) 11/2008   Hemorrhoids    Hiatal hernia    SMALL   History of chicken pox    IBS (irritable bowel syndrome)    Osteopenia    Peptic stricture of esophagus 11/2008   PONV (postoperative nausea and vomiting)    N/V WITH GALLBLADDER   Recurrent sinusitis    Past Surgical  History:  Procedure Laterality Date   BREAST BIOPSY Right 07/26/2017   INTRADUCTAL PAPILLOMA.    BREAST BIOPSY Left 08/27/2020   affirm bx #1-X clip-   BREAST BIOPSY Left 08/27/2020   affirm bx-#2-coil: clip-   BREAST BIOPSY Right 10/13/2021   us  bx, heart marker, FIBROEPITHELIAL PROLIFERATION WITH SCLEROSIS AND USUAL DUCTAL HYPERPLASIA  BREAST BIOPSY Right 10/29/2021   right stereo bx X clip - FIBROEPITHELIAL PROLIFERATION WITH SCLEROSIS   BREAST BIOPSY WITH RADIO FREQUENCY LOCALIZER Left 09/26/2020   Procedure: BREAST BIOPSY WITH RADIO FREQUENCY LOCALIZER;  Surgeon: Rodolph Romano, MD;  Location: [facility];  Service: General;  Laterality: Left;   BREAST LUMPECTOMY Right 08/2017   BREAST LUMPECTOMY WITH NEEDLE LOCALIZATION Right 09/09/2017   Procedure: BREAST LUMPECTOMY WITH NEEDLE LOCALIZATION;  Surgeon: Claudene Larinda Bolder, MD;  Location: [facility];  Service: General;  Laterality: Right;   CHOLECYSTECTOMY  2010   TUBAL LIGATION     Social History   Tobacco Use   Smoking status: Never   Smokeless tobacco: Never  Vaping Use   Vaping status: Never Used  Substance Use Topics   Alcohol use: Not Currently    Comment: maybe 2 or 3 drinks a year   Drug use: Never   Family History  Problem Relation Age of Onset   Aortic aneurysm Mother    Heart disease Mother    Heart attack Brother        early 28's   Arthritis Brother    Breast cancer Sister 60   Arthritis Sister    Colon cancer Neg Hx    Allergies  Allergen Reactions   Levofloxacin Itching and Other (See Comments)    Felt like she had bugs crawling all over her.   Tessalon  [Benzonatate ] Nausea And Vomiting    Made GERD worse   Current Outpatient Medications on File Prior to Visit  Medication Sig Dispense Refill   albuterol (VENTOLIN HFA) 108 (90 Base) MCG/ACT inhaler Inhale 2 puffs into the lungs every 6 (six) hours as needed for wheezing or shortness of breath.     Cholecalciferol (VITAMIN D3) 1000 units  CAPS Take by mouth. Pt taking 1 every other day     dicyclomine  (BENTYL ) 10 MG capsule Take 1 capsule (10 mg total) by mouth 3 (three) times daily as needed. 90 capsule 11   fexofenadine (ALLEGRA) 180 MG tablet Take 180 mg by mouth every morning.     omeprazole  (PRILOSEC) 20 MG capsule Take 1 capsule (20 mg total) by mouth 2 (two) times daily before a meal. 60 capsule 11   Probiotic Product (ALIGN) 4 MG CAPS Take 1 capsule by mouth daily as needed (upset stomach).     vitamin B-12 (CYANOCOBALAMIN ) 1000 MCG tablet Take 1,000 mcg by mouth daily.     WIXELA INHUB 250-50 MCG/ACT AEPB      zolpidem  (AMBIEN ) 5 MG tablet TAKE 1/2 TO 1 (ONE-HALF TO ONE) TABLET BY MOUTH AT BEDTIME 30 tablet 3   No current facility-administered medications on file prior to visit.    Review of Systems  Constitutional:  Negative for activity change, appetite change, fatigue, fever and unexpected weight change.  HENT:  Positive for hearing loss. Negative for congestion, ear pain, rhinorrhea, sinus pressure and sore throat.   Eyes:  Negative for pain, redness and visual disturbance.  Respiratory:  Negative for cough, shortness of breath and wheezing.   Cardiovascular:  Negative for chest pain and palpitations.  Gastrointestinal:  Negative for abdominal pain, blood in stool, constipation and diarrhea.  Endocrine: Negative for polydipsia and polyuria.  Genitourinary:  Negative for dysuria, frequency and urgency.  Musculoskeletal:  Negative for arthralgias, back pain and myalgias.  Skin:  Negative for pallor and rash.  Allergic/Immunologic: Negative for environmental allergies.  Neurological:  Negative for dizziness, syncope and headaches.  Hematological:  Negative for adenopathy. Does  not bruise/bleed easily.  Psychiatric/Behavioral:  Negative for decreased concentration and dysphoric mood. The patient is not nervous/anxious.        Objective:   Physical Exam Constitutional:      General: She is not in acute  distress.    Appearance: Normal appearance. She is well-developed and normal weight. She is not ill-appearing or diaphoretic.  HENT:     Head: Normocephalic and atraumatic.     Right Ear: Ear canal and external ear normal. There is impacted cerumen.     Left Ear: Ear canal and external ear normal. There is impacted cerumen.     Nose: Nose normal. No congestion.     Mouth/Throat:     Mouth: Mucous membranes are moist.     Pharynx: Oropharynx is clear. No posterior oropharyngeal erythema.  Eyes:     General: No scleral icterus.    Extraocular Movements: Extraocular movements intact.     Conjunctiva/sclera: Conjunctivae normal.     Pupils: Pupils are equal, round, and reactive to light.  Neck:     Thyroid : No thyromegaly.     Vascular: No carotid bruit or JVD.  Cardiovascular:     Rate and Rhythm: Normal rate and regular rhythm.     Pulses: Normal pulses.     Heart sounds: Normal heart sounds.     No gallop.  Pulmonary:     Effort: Pulmonary effort is normal. No respiratory distress.     Breath sounds: Normal breath sounds. No wheezing.     Comments: Good air exch Chest:     Chest wall: No tenderness.  Abdominal:     General: Bowel sounds are normal. There is no distension or abdominal bruit.     Palpations: Abdomen is soft. There is no mass.     Tenderness: There is no abdominal tenderness.     Hernia: No hernia is present.  Genitourinary:    Comments: Breast exam: No mass, nodules, thickening, tenderness, bulging, retraction, inflamation, nipple discharge or skin changes noted.  No axillary or clavicular LA.     Musculoskeletal:        General: No tenderness. Normal range of motion.     Cervical back: Normal range of motion and neck supple. No rigidity. No muscular tenderness.     Right lower leg: No edema.     Left lower leg: No edema.     Comments: No kyphosis   Lymphadenopathy:     Cervical: No cervical adenopathy.  Skin:    General: Skin is warm and dry.      Coloration: Skin is not pale.     Findings: No erythema or rash.     Comments: Solar lentigines diffusely Some sks   Neurological:     Mental Status: She is alert. Mental status is at baseline.     Cranial Nerves: No cranial nerve deficit.     Motor: No abnormal muscle tone.     Coordination: Coordination normal.     Gait: Gait normal.     Deep Tendon Reflexes: Reflexes are normal and symmetric. Reflexes normal.  Psychiatric:        Mood and Affect: Mood normal.        Cognition and Memory: Cognition and memory normal.           Assessment & Plan:   Problem List Items Addressed This Visit       Digestive   GERD   Continues omeprazole  20 mg bid Watching B12 and D levels  Encouraged to avoid triggers          Nervous and Auditory   Cerumen impaction   Bilateral New ? If affecting hearing  Will try debrox over the counter  See AVS Follow up if no improvement in a month for irrigation         Musculoskeletal and Integument   Osteopenia   Dexa due /ordered   No falls or fracture  Encouraged to take D more regularly  Discussed fall prevention, supplements and exercise for bone density          Other   Vitamin D  deficiency   Last vitamin D  Lab Results  Component Value Date   VD25OH 25.87 (L) 01/27/2024   Encouraged pt to be more compliant with her daily D supplement for bone and overall health       Vitamin B12 deficiency   Lab Results  Component Value Date   VITAMINB12 989 (H) 01/27/2024   Instructed to take B12 every other day or cut dose in 1/2       Routine general medical examination at a health care facility - Primary   Reviewed health habits including diet and exercise and skin cancer prevention Reviewed appropriate screening tests for age  Also reviewed health mt list, fam hx and immunization status , as well as social and family history   See HPI Labs reviewed and ordered Health Maintenance  Topic Date Due   Hepatitis C Screening   Never done   DTaP/Tdap/Td vaccine (2 - Td or Tdap) 05/24/2021   Zoster (Shingles) Vaccine (1 of 2) 06/27/2024*   COVID-19 Vaccine (5 - 2024-25 season) 04/12/2026*   Flu Shot  03/30/2024   DEXA scan (bone density measurement)  03/31/2024   Mammogram  10/06/2024   Medicare Annual Wellness Visit  02/05/2025   Pneumococcal Vaccine for age over 68  Completed   Hepatitis B Vaccine  Aged Out   HPV Vaccine  Aged Out   Meningitis B Vaccine  Aged Out   Colon Cancer Screening  Discontinued  *Topic was postponed. The date shown is not the original due date.    Tdap given Declines shingrix  Dexa ordered Discussed fall prevention, supplements and exercise for bone density  No falls or fractures PHQ 1       Palpitations   Cutting caffeine and better hydration have helped but not resolved episodic palpitations Referral done to cardiology      Mild hyperlipidemia   Disc goals for lipids and reasons to control them Rev last labs with pt Rev low sat fat diet in detail  Overall fairly stable Great HDL in 80s LDL 119  ASCVD 89.1%      Estrogen deficiency   Dexa ordered       Relevant Orders   DG Bone Density   Current use of proton pump inhibitor   Lab Results  Component Value Date   VITAMINB12 989 (H) 01/27/2024   Last vitamin D  Lab Results  Component Value Date   VD25OH 25.87 (L) 01/27/2024

## 2024-03-27 NOTE — Assessment & Plan Note (Signed)
 Dexa ordered

## 2024-03-27 NOTE — Assessment & Plan Note (Signed)
 Dexa due /ordered   No falls or fracture  Encouraged to take D more regularly  Discussed fall prevention, supplements and exercise for bone density

## 2024-03-27 NOTE — Assessment & Plan Note (Signed)
 Bilateral New ? If affecting hearing  Will try debrox over the counter  See AVS Follow up if no improvement in a month for irrigation

## 2024-03-27 NOTE — Assessment & Plan Note (Signed)
 Cutting caffeine and better hydration have helped but not resolved episodic palpitations Referral done to cardiology

## 2024-03-27 NOTE — Assessment & Plan Note (Signed)
 Lab Results  Component Value Date   VITAMINB12 989 (H) 01/27/2024   Last vitamin D  Lab Results  Component Value Date   VD25OH 25.87 (L) 01/27/2024

## 2024-04-26 ENCOUNTER — Ambulatory Visit
Admission: RE | Admit: 2024-04-26 | Discharge: 2024-04-26 | Disposition: A | Discharge status: Home / Self Care | Source: Ambulatory Visit | Attending: Family Medicine | Admitting: Family Medicine

## 2024-04-26 DIAGNOSIS — E2839 Other primary ovarian failure: Secondary | ICD-10-CM | POA: Insufficient documentation

## 2024-04-29 ENCOUNTER — Ambulatory Visit: Payer: Self-pay | Admitting: Family Medicine

## 2024-05-07 ENCOUNTER — Other Ambulatory Visit: Payer: Self-pay | Admitting: Family Medicine

## 2024-05-07 NOTE — Telephone Encounter (Signed)
 Name of Medication: Ambien  Name of Pharmacy: [facility] [address] Last Fill or Written Date and Quantity: 11/14/23 #30 tabs/ 3 refills Last Office Visit and Type: CPE on 03/27/24 Next Office Visit and Type: CPE on 03/29/25

## 2024-05-11 ENCOUNTER — Ambulatory Visit: Attending: Internal Medicine | Admitting: Internal Medicine

## 2024-05-11 ENCOUNTER — Ambulatory Visit (INDEPENDENT_AMBULATORY_CARE_PROVIDER_SITE_OTHER)

## 2024-05-11 ENCOUNTER — Encounter: Payer: Self-pay | Admitting: Internal Medicine

## 2024-05-11 VITALS — BP 134/78 | HR 65 | Ht 64.5 in | Wt 149.1 lb

## 2024-05-11 DIAGNOSIS — E785 Hyperlipidemia, unspecified: Secondary | ICD-10-CM

## 2024-05-11 DIAGNOSIS — R002 Palpitations: Secondary | ICD-10-CM

## 2024-05-11 NOTE — Patient Instructions (Signed)
 Medication Instructions:  Your physician recommends that you continue on your current medications as directed. Please refer to the Current Medication list given to you today.    *If you need a refill on your cardiac medications before your next appointment, please call your pharmacy*  Lab Work: No labs ordered today    Testing/Procedures: ZIO XT- Long Term Monitor Instructions  Your physician has requested you wear a ZIO patch monitor for 14 days.  This is a single patch monitor. [employer] supplies one patch monitor per enrollment. Additional stickers are not available. Please do not apply patch if you will be having a Nuclear Stress Test, Echocardiogram, Cardiac CT, MRI, or Chest Xray during the period you would be wearing the monitor. The patch cannot be worn during these tests. You cannot remove and re-apply the ZIO XT patch monitor.  Your ZIO patch monitor will be mailed 3 day USPS to your address on file. It may take 3-5 days to receive your monitor after you have been enrolled. Once you have received your monitor, please review the enclosed instructions. Your monitor has already been registered assigning a specific monitor serial number to you.  Billing and Patient Assistance Program Information  We have supplied [employer] with any of your insurance information on file for billing purposes.  [employer] offers a sliding scale Patient Assistance Program for patients that do not have insurance, or whose insurance does not completely cover the cost of the ZIO monitor.  You must apply for the Patient Assistance Program to qualify for this discounted rate.  To apply, please call [employer] at [phone], select option 4, select option 2, ask to apply for Patient Assistance Program. Meredeth will ask your household income, and how many people are in your household. They will quote your out-of-pocket cost based on that information. [employer] will also be able to set up a 69-month, interest-free payment plan if  needed.  Applying the monitor   Shave hair from upper left chest.  Hold abrader disc by orange tab. Rub abrader in 40 strokes over the upper left chest as indicated in your monitor instructions.  Clean area with 4 enclosed alcohol pads. Let dry.  Apply patch as indicated in monitor instructions. Patch will be placed under collarbone on left side of chest with arrow pointing upward.  Rub patch adhesive wings for 2 minutes. Remove white label marked 1. Remove the white label marked 2. Rub patch adhesive wings for 2 additional minutes.  While looking in a mirror, press and release button in center of patch. A small green light will flash 3-4 times. This will be your only indicator that the monitor has been turned on.  Do not shower for the first 24 hours. You may shower after the first 24 hours.  Press the button if you feel a symptom. You will hear a small click. Record Date, Time and Symptom in the Patient Logbook.  When you are ready to remove the patch, follow instructions on the last 2 pages of Patient Logbook.  Stick patch monitor into the tabs at the bottom of the return box.  Place Patient Logbook in the blue and white box. Use locking tab on box and tape box closed securely. The blue and white box has prepaid postage on it. Please place it in the mailbox as soon as possible. Your physician should have your test results approximately 7-14 days after the monitor has been mailed back to [facility].  Call [facility] Customer Care at [phone] if you  have questions regarding your ZIO XT patch monitor.  Call them immediately if you see an orange light blinking on your monitor.  If your monitor falls off in less than 4 days, contact our Monitor department at [phone].  If your monitor becomes loose or falls off after 4 days call [employer] at [phone] for suggestions on securing your monitor.   Follow-Up: At [facility], you and your health needs are our priority.   As part of our continuing mission to provide you with exceptional heart care, our providers are all part of one team.  This team includes your primary Cardiologist (physician) and Advanced Practice Providers or APPs (Physician Assistants and Nurse Practitioners) who all work together to provide you with the care you need, when you need it.  Your next appointment:   6 week(s)  Provider:   You may see Lonni Hanson, MD or one of the following Advanced Practice Providers on your designated Care Team:   Lonni Meager, NP Lesley Maffucci, PA-C Bernardino Bring, PA-C Cadence Alto, PA-C Tylene Lunch, NP Barnie Hila, NP

## 2024-05-11 NOTE — Progress Notes (Unsigned)
  Cardiology Office Note:  .   Date:  05/11/2024  ID:  Jenny Mcfarland, DOB [birth year], MRN [id number] PCP: Randeen Laine LABOR, MD  [facility] Providers Cardiologist:  New Click to update primary MD,subspecialty MD or APP then REFRESH:1}    History of Present Illness: .   Jenny Mcfarland is a 73 y.o. female with history of hyperlipidemia, asthma, IBS, GERD, and arthritis, who has been referred for evaluation of palpitations.  [name] reports that she has been experiencing intermittent palpitations when she falls asleep or first wakes up.  These have been going on for 6 to 8 months but became more pronounced around Feb 05, 2024.  She saw Dr. Randeen, who suggested that inadequate fluid intake could be contributing to her palpitations.  However, [name] has not noticed any significant improvement since increasing her water consumption.  She notes that the palpitations happen about once a week and typically only last a few seconds.  There are no associated symptoms.  She notes having been evaluated in [place] around 2011 for nocturnal chest pain.  Note by Dr. Rolan makes note of reported back pain; AAA Duplex was order and found to be normal.  She has not had any recurrent chest pain and also denies shortness of breath unless her asthma kicks in.  She denies lightheadedness and edema as well.  ROS: See HPI  Studies Reviewed: SABRA   EKG Interpretation Date/Time:  Friday May 11 2024 14:55:37 EDT Ventricular Rate:  65 PR Interval:  116 QRS Duration:  72 QT Interval:  386 QTC Calculation: 401 R Axis:   52  Text Interpretation: Normal sinus rhythm Nonspecific ST abnormality Abnormal ECG When compared with ECG of 22-Sep-2020 14:34, No significant change was found Confirmed by Lenoria Narine, Lonni [phone]) on 05/11/2024 3:01:08 PM    Risk Assessment/Calculations:             Physical Exam:   VS:  BP 134/78 (BP Location: Right Arm, Cuff Size: Normal)   Pulse 65   Ht 5' 4.5 (1.638 m)    Wt 149 lb 2 oz (67.6 kg)   SpO2 98%   BMI 25.20 kg/m    Wt Readings from Last 3 Encounters:  05/11/24 149 lb 2 oz (67.6 kg)  03/27/24 149 lb (67.6 kg)  02/17/24 146 lb (66.2 kg)    General:  NAD. Neck: No JVD or HJR. Lungs: Clear to auscultation bilaterally without wheezes or crackles. Heart: Regular rate and rhythm without murmurs, rubs, or gallops. Abdomen: Soft, nontender, nondistended. Extremities: No lower extremity edema.  ASSESSMENT AND PLAN: .    Palpitations: Palpitations have been present for 6 to 8 months and typically only occur when [name] is waking up or falling asleep.  EKG and examination today are unrevealing.  We will obtain a 14-day event monitor for further characterization.  If symptoms persist and event monitor is unrevealing, sleep evaluation and/or neurology consultation may be helpful to evaluate for alternative etiologies of hypnagogic/hypnopompic sensations.  Labs in February 05, 2024, including CMP, CBC, and TSH were unrevealing.  Hyperlipidemia: LDL mildly elevated in 05-Feb-2024.  In the absence of established ASCVD, I think it is reasonable to continue working on lifestyle modifications to target LDL less than 100.    Dispo: Return to clinic in 6 weeks.  Signed, Lonni Hanson, MD

## 2024-05-25 ENCOUNTER — Telehealth: Payer: Self-pay | Admitting: Internal Medicine

## 2024-05-25 NOTE — Telephone Encounter (Signed)
 Advised if itching is bad to take monitor off and ship it back , use anti-itch cream topically - patient verbalized understanding

## 2024-05-25 NOTE — Telephone Encounter (Signed)
 Pt would like to know if she can remove her Zio monitor due to it causing a rash on her skin. She wore the monitor for 8 days.

## 2024-05-25 NOTE — Telephone Encounter (Signed)
 Called patient to advise on ZIO - left message on unidentified voicemail to return call to the office

## 2024-05-31 DIAGNOSIS — R002 Palpitations: Secondary | ICD-10-CM | POA: Diagnosis not present

## 2024-06-01 DIAGNOSIS — R002 Palpitations: Secondary | ICD-10-CM

## 2024-06-04 ENCOUNTER — Ambulatory Visit: Payer: Self-pay | Admitting: Internal Medicine

## 2024-06-11 ENCOUNTER — Encounter: Payer: Self-pay | Admitting: Family Medicine

## 2024-06-11 ENCOUNTER — Ambulatory Visit: Admitting: Family Medicine

## 2024-06-11 VITALS — BP 134/72 | HR 62 | Temp 98.4°F | Ht 64.5 in | Wt 150.0 lb

## 2024-06-11 DIAGNOSIS — H9193 Unspecified hearing loss, bilateral: Secondary | ICD-10-CM

## 2024-06-11 DIAGNOSIS — Z23 Encounter for immunization: Secondary | ICD-10-CM

## 2024-06-11 DIAGNOSIS — H6123 Impacted cerumen, bilateral: Secondary | ICD-10-CM

## 2024-06-11 DIAGNOSIS — R002 Palpitations: Secondary | ICD-10-CM

## 2024-06-11 NOTE — Assessment & Plan Note (Signed)
 Left side resolved with debrox and simple irrigation  Right side improved but not resolved   Given option of re trial after debrox use or ENT ref Pt prefers to try here again  Will use debrox twice weekly and follow up 1 mo for repeat irrigation right ear

## 2024-06-11 NOTE — Assessment & Plan Note (Signed)
 Reassuring cardiac work up/ monitor  For follow up soon May consider beta blocker if symptomatic Pt notes only has symptoms when in bed/waking up

## 2024-06-11 NOTE — Assessment & Plan Note (Signed)
 In process of resolving cerumen impaction (see that a/p) Then considering audiology eval

## 2024-06-11 NOTE — Patient Instructions (Signed)
 Your left ear is clear Right ear still has some wax in it (cerumen)   Go back to using the debrox in the right ear twice weekly  Follow up in a month and we will irritate again   Let us  know if you have any other ear symptoms

## 2024-06-11 NOTE — Progress Notes (Signed)
 Subjective:    Patient ID: Jenny Mcfarland, female    DOB: 1951-01-30, 73 y.o.   MRN: [id number]  HPI  Wt Readings from Last 3 Encounters:  06/11/24 150 lb (68 kg)  05/11/24 149 lb 2 oz (67.6 kg)  03/27/24 149 lb (67.6 kg)   25.35 kg/m  Vitals:   06/11/24 1423  BP: 134/72  Pulse: 62  Temp: 98.4 F (36.9 C)  SpO2: 99%   Pt presents for c/o Cerumen impaction  Needs flu shot   Last visit noted bilateral  cerumen impaction Instructed to use debrox and follow up for irrigation in a month  Used twice weekly for 4 weeks    Patient Active Problem List   Diagnosis Date Noted   Cerumen impaction 03/27/2024   Productive cough 02/17/2024   Palpitations 01/27/2024   Anemia 02/15/2023   Abnormality on screening test 09/15/2022   Family history of early CAD 09/15/2022   Atypical lobular hyperplasia of breast 02/01/2022   Routine general medical examination at a health care facility 01/21/2022   Abnormal endometrial ultrasound 03/03/2021   Hearing loss 01/27/2021   Post-menopausal bleeding 01/27/2021   Vitamin B12 deficiency 01/19/2021   Current use of proton pump inhibitor 01/18/2021   Welcome to Medicare preventive visit 12/21/2016   Estrogen deficiency 06/04/2015   Vitamin D  deficiency 09/23/2014   Thoracic or lumbosacral neuritis or radiculitis, unspecified 05/21/2013   Encounter for gynecological examination 05/25/2011   Encounter for general adult medical examination with abnormal findings 05/25/2011   Osteopenia 12/27/2010   IBS (irritable bowel syndrome) 12/25/2010   Allergic rhinitis 12/25/2010   Asthma 12/25/2010   Mild hyperlipidemia 12/25/2010   Family history of breast cancer 12/25/2010   Family history of aortic aneurysm 12/25/2010   GERD 11/08/2008   Past Medical History:  Diagnosis Date   Allergic rhinitis    Allergy    Anemia 02/15/2023   Arthritis    Asthma    WELL CONTROLLED   Barrett's esophagus 2005   Cataract    Complication of  anesthesia    GERD (gastroesophageal reflux disease) 11/2008   Hemorrhoids    Hiatal hernia    SMALL   History of chicken pox    IBS (irritable bowel syndrome)    Osteopenia    Peptic stricture of esophagus 11/2008   PONV (postoperative nausea and vomiting)    N/V WITH GALLBLADDER   Recurrent sinusitis    Past Surgical History:  Procedure Laterality Date   BREAST BIOPSY Right 07/26/2017   INTRADUCTAL PAPILLOMA.    BREAST BIOPSY Left 08/27/2020   affirm bx #1-X clip-   BREAST BIOPSY Left 08/27/2020   affirm bx-#2-coil: clip-   BREAST BIOPSY Right 10/13/2021   us  bx, heart marker, FIBROEPITHELIAL PROLIFERATION WITH SCLEROSIS AND USUAL DUCTAL HYPERPLASIA   BREAST BIOPSY Right 10/29/2021   right stereo bx X clip - FIBROEPITHELIAL PROLIFERATION WITH SCLEROSIS   BREAST BIOPSY WITH RADIO FREQUENCY LOCALIZER Left 09/26/2020   Procedure: BREAST BIOPSY WITH RADIO FREQUENCY LOCALIZER;  Surgeon: Rodolph Romano, MD;  Location: [facility];  Service: General;  Laterality: Left;   BREAST LUMPECTOMY Right 08/2017   BREAST LUMPECTOMY WITH NEEDLE LOCALIZATION Right 09/09/2017   Procedure: BREAST LUMPECTOMY WITH NEEDLE LOCALIZATION;  Surgeon: Claudene Larinda Bolder, MD;  Location: [facility];  Service: General;  Laterality: Right;   CHOLECYSTECTOMY  2010   TUBAL LIGATION     Social History   Tobacco Use   Smoking status: Never   Smokeless tobacco: Never  Vaping Use   Vaping status: Never Used  Substance Use Topics   Alcohol use: Not Currently    Comment: maybe 2 or 3 drinks a year   Drug use: Never   Family History  Problem Relation Age of Onset   Aortic aneurysm Mother    Heart disease Mother    Heart failure Mother    Breast cancer Sister 6   Arthritis Sister    Heart attack Brother        early 57's   Arthritis Brother    Colon cancer Neg Hx    Allergies  Allergen Reactions   Levofloxacin Itching and Other (See Comments)    Felt like she had bugs crawling all over her.    Tessalon  [Benzonatate ] Nausea And Vomiting    Made GERD worse   Current Outpatient Medications on File Prior to Visit  Medication Sig Dispense Refill   albuterol (VENTOLIN HFA) 108 (90 Base) MCG/ACT inhaler Inhale 2 puffs into the lungs every 6 (six) hours as needed for wheezing or shortness of breath.     Cholecalciferol (VITAMIN D3) 1000 units CAPS Take by mouth. Pt taking 1 every other day     dicyclomine  (BENTYL ) 10 MG capsule Take 1 capsule (10 mg total) by mouth 3 (three) times daily as needed. 90 capsule 11   fexofenadine (ALLEGRA) 180 MG tablet Take 180 mg by mouth every morning.     omeprazole  (PRILOSEC) 20 MG capsule Take 1 capsule (20 mg total) by mouth 2 (two) times daily before a meal. 60 capsule 11   Probiotic Product (ALIGN) 4 MG CAPS Take 1 capsule by mouth daily as needed (upset stomach).     Tdap (BOOSTRIX ) 5-2.5-18.5 LF-MCG/0.5 injection Inject into the muscle. 0.5 mL 0   vitamin B-12 (CYANOCOBALAMIN ) 1000 MCG tablet Take 1,000 mcg by mouth daily.     WIXELA INHUB 250-50 MCG/ACT AEPB  (Patient taking differently: 1 puff daily in the afternoon.)     zolpidem  (AMBIEN ) 5 MG tablet TAKE 1/2 TO 1 (ONE-HALF TO ONE) TABLET BY MOUTH AT BEDTIME 30 tablet 3   No current facility-administered medications on file prior to visit.    Review of Systems  HENT:  Positive for hearing loss. Negative for ear discharge and ear pain.   Neurological:  Negative for dizziness and light-headedness.       Objective:   Physical Exam Constitutional:      General: She is not in acute distress.    Appearance: Normal appearance. She is normal weight. She is not ill-appearing or diaphoretic.  HENT:     Head: Normocephalic and atraumatic.     Right Ear: There is impacted cerumen.     Left Ear: Tympanic membrane and ear canal normal. There is impacted cerumen.     Ears:     Comments: Bilateral cerumen impaction  S/p simple irrigation  Left canal is totally clear (normal appearing TM) Right  canal still has mild to moderate impaction   No tenderness or external abnormalities   After consent obtained  Procedure: Cerumen Disimpaction bilateral   Warm water was applied and gentle ear lavage performed on both ears.  There were no complications and following the disimpaction the tympanic membrane were visible on the left side. Tympanic membranes are intact following the procedure.  Auditory canals are normal.  The patient reported relief of symptoms after removal of cerumen on the left      Eyes:     Conjunctiva/sclera: Conjunctivae normal.  Pupils: Pupils are equal, round, and reactive to light.  Cardiovascular:     Rate and Rhythm: Normal rate and regular rhythm.  Pulmonary:     Effort: Pulmonary effort is normal. No respiratory distress.  Skin:    Findings: No rash.  Neurological:     Mental Status: She is alert.     Cranial Nerves: No cranial nerve deficit.  Psychiatric:        Mood and Affect: Mood normal.           Assessment & Plan:   Problem List Items Addressed This Visit       Nervous and Auditory   Hearing loss   In process of resolving cerumen impaction (see that a/p) Then considering audiology eval       Cerumen impaction - Primary   Left side resolved with debrox and simple irrigation  Right side improved but not resolved   Given option of re trial after debrox use or ENT ref Pt prefers to try here again  Will use debrox twice weekly and follow up 1 mo for repeat irrigation right ear         Other   Palpitations   Reassuring cardiac work up/ monitor  For follow up soon May consider beta blocker if symptomatic Pt notes only has symptoms when in bed/waking up      Other Visit Diagnoses       Need for influenza vaccination       Relevant Orders   Flu vaccine HIGH DOSE PF(Fluzone Trivalent) (Completed)

## 2024-07-04 ENCOUNTER — Encounter: Payer: Self-pay | Admitting: Internal Medicine

## 2024-07-04 ENCOUNTER — Ambulatory Visit: Attending: Internal Medicine | Admitting: Internal Medicine

## 2024-07-04 VITALS — BP 129/64 | HR 73 | Ht 64.0 in | Wt 150.8 lb

## 2024-07-04 DIAGNOSIS — R002 Palpitations: Secondary | ICD-10-CM

## 2024-07-04 NOTE — Progress Notes (Unsigned)
  Cardiology Office Note:  .   Date:  07/04/2024  ID:  Jenny Mcfarland, DOB 06-Nov-1950, MRN [id number] PCP: Jenny Mcfarland LABOR, MD  [facility] Providers Cardiologist:  Lonni Hanson, MD { Click to update primary MD,subspecialty MD or APP then REFRESH:1}    History of Present Illness: .   Jenny Mcfarland is a 73 y.o. female with history of hyperlipidemia, asthma, IBS, GERD, and arthritis, who presents for follow-up of palpitations.  I met her in September, at which time she reported intermittent palpitations most pronounced when falling asleep or first waking up.  Palpitations had been present for 6 to 8 months but became more pronounced around 01/27/24.  She tried increasing her fluid intake at the recommendation of Dr. Randeen, though this did not help.  We agreed to obtain a 14-day event monitor, which showed predominantly sinus rhythm with rare PACs and PVCs as well as 9 brief supraventricular runs.  Palpitations improved.  Only had one episode in last 4-5 weeks.  No changes.  No CP, SOB, or LH.  ROS: See HPI  Studies Reviewed: SABRA        Event monitor (patient was monitored for 7 days, 21 hours.  This showed predominantly sinus rhythm with rare PACs and PVCs as well as 9 supraventricular runs lasting up to 12 beats with a maximal rate of 164 bpm.  Patient triggered events corresponded to sinus rhythm and PACs.  Risk Assessment/Calculations:   {Does this patient have ATRIAL FIBRILLATION?:[phone]}     STOP-Bang Score:  1  { Consider Dx Sleep Disordered Breathing or Sleep Apnea  ICD G47.33          :1}    Physical Exam:   VS:  BP 129/64 (BP Location: Right Arm, Patient Position: Sitting, Cuff Size: Normal)   Pulse 73 Comment: 73 oximeter  Ht 5' 4 (1.626 m)   Wt 150 lb 12.8 oz (68.4 kg)   SpO2 99%   BMI 25.88 kg/m    Wt Readings from Last 3 Encounters:  07/04/24 150 lb 12.8 oz (68.4 kg)  06/11/24 150 lb (68 kg)  05/11/24 149 lb 2 oz (67.6 kg)    General:  NAD. Neck:  No JVD or HJR. Lungs: Clear to auscultation bilaterally without wheezes or crackles. Heart: Regular rate and rhythm without murmurs, rubs, or gallops. Abdomen: Soft, nontender, nondistended. Extremities: No lower extremity edema.  ASSESSMENT AND PLAN: .    ***    {Are you ordering a CV Procedure (e.g. stress test, cath, DCCV, TEE, etc)?   Press F2        :[id number]}  Dispo: ***  Signed, Lonni Hanson, MD

## 2024-07-04 NOTE — Patient Instructions (Signed)
 Medication Instructions:  Your physician recommends that you continue on your current medications as directed. Please refer to the Current Medication list given to you today.    *If you need a refill on your cardiac medications before your next appointment, please call your pharmacy*  Lab Work: No labs ordered today    Testing/Procedures: No test ordered today   Follow-Up: At [facility], you and your health needs are our priority.  As part of our continuing mission to provide you with exceptional heart care, our providers are all part of one team.  This team includes your primary Cardiologist (physician) and Advanced Practice Providers or APPs (Physician Assistants and Nurse Practitioners) who all work together to provide you with the care you need, when you need it.  Your next appointment:   As needed  Provider:   You may see Sammy Crisp, MD or one of the following Advanced Practice Providers on your designated Care Team:   Laneta Pintos, NP Gildardo Labrador, PA-C Varney Gentleman, PA-C Cadence Winger, PA-C Ronald Cockayne, NP Morey Ar, NP

## 2024-07-05 ENCOUNTER — Encounter: Payer: Self-pay | Admitting: Internal Medicine

## 2024-07-13 ENCOUNTER — Ambulatory Visit: Admitting: Family Medicine

## 2024-07-30 ENCOUNTER — Other Ambulatory Visit: Payer: Self-pay | Admitting: Family Medicine

## 2024-07-30 DIAGNOSIS — Z1231 Encounter for screening mammogram for malignant neoplasm of breast: Secondary | ICD-10-CM

## 2024-07-31 ENCOUNTER — Ambulatory Visit: Payer: Self-pay

## 2024-07-31 NOTE — Telephone Encounter (Signed)
 Aware, will watch for correspondence Thanks for seeing her

## 2024-07-31 NOTE — Telephone Encounter (Signed)
  FYI Only or Action Required?: FYI only for provider: appointment scheduled on 08/01/24.  Patient was last seen in primary care on 06/11/2024 by Randeen Laine LABOR, MD.  Called Nurse Triage reporting Constipation.  Symptoms began a week ago.  Interventions attempted: OTC medications: Dulcolax.  Symptoms are: unchanged.  Triage Disposition: See Physician Within 24 Hours  Patient/caregiver understands and will follow disposition?: Yes  Reason for Disposition  Last bowel movement (BM) > 4 days ago  Answer Assessment - Initial Assessment Questions Last bowel movement was last Monday, denies straining, states it was a little pasty more than normal. Tried eating apple sauce, watching diet eating soups. Not hungry much, denies abd pain or bloating.   1. STOOL PATTERN OR FREQUENCY: How often do you have a bowel movement (BM)?  (Normal range: 3 times a day to every 3 days)  When was your last BM?       Usually goes every day, or every other day  2. STRAINING: Do you have to strain to have a BM?      Denies  3. ONSET: When did the constipation begin?     Last Tuesday  4. RECTAL PAIN: Does your rectum hurt when the stool comes out? If Yes, ask: Do you have hemorrhoids? How bad is the pain?  (Scale 1-10; or mild, moderate, severe)     No  5. BM COMPOSITION: Are the stools hard?      No  6. BLOOD ON STOOLS: Has there been any blood on the toilet tissue or on the surface of the BM? If Yes, ask: When was the last time?     No  7. CHRONIC CONSTIPATION: Is this a new problem for you?  If No, ask: How long have you had this problem? (days, weeks, months)      Denies  8. CHANGES IN DIET OR HYDRATION: Have there been any recent changes in your diet? How much fluids are you drinking on a daily basis?  How much have you had to drink today?     Denies  9. MEDICINES: Have you been taking any new medicines? Are you taking any narcotic pain medicines? (e.g.,  Dilaudid , morphine, Percocet, Vicodin)     Denies  10. LAXATIVES: Have you been using any stool softeners, laxatives, or enemas?  If Yes, ask What are you using, how often, and when was the last time?       Dulcolax  11. ACTIVITY:  How much walking do you do every day?  Has your activity level decreased in the past week?        Activity level has been the same, states not just laying around  12. CAUSE: What do you think is causing the constipation?        Unsure, denies new foods or medications  13. OTHER SYMPTOMS: Do you have any other symptoms? (e.g., abdomen pain, bloating, fever, vomiting)       Denies  Protocols used: Constipation-A-AH  Message from [place] E sent at 07/31/2024  2:15 PM EST  Summary: 1 week with no bowel movement   Reason for Triage:  call this number [phone]  1 week with no bowel movement Dulcolax 4 times the las week - bloated   -no change in medication or eating habbit -eating less, not hungry

## 2024-08-01 ENCOUNTER — Encounter: Payer: Self-pay | Admitting: Family Medicine

## 2024-08-01 ENCOUNTER — Ambulatory Visit: Admitting: Family Medicine

## 2024-08-01 ENCOUNTER — Ambulatory Visit
Admission: RE | Admit: 2024-08-01 | Discharge: 2024-08-01 | Disposition: A | Source: Ambulatory Visit | Attending: Family Medicine | Admitting: Family Medicine

## 2024-08-01 VITALS — BP 110/80 | HR 65 | Temp 98.5°F | Ht 64.0 in | Wt 151.4 lb

## 2024-08-01 DIAGNOSIS — K5900 Constipation, unspecified: Secondary | ICD-10-CM | POA: Insufficient documentation

## 2024-08-01 DIAGNOSIS — B029 Zoster without complications: Secondary | ICD-10-CM | POA: Insufficient documentation

## 2024-08-01 MED ORDER — VALACYCLOVIR HCL 1 G PO TABS: 1000.0000 mg | ORAL_TABLET | Freq: Three times a day (TID) | ORAL | 0 refills | Status: AC

## 2024-08-01 MED ORDER — GABAPENTIN 300 MG PO CAPS: 300.0000 mg | ORAL_CAPSULE | Freq: Two times a day (BID) | ORAL | 1 refills | Status: AC | PRN

## 2024-08-01 NOTE — Progress Notes (Unsigned)
 Ph: [phone] Fax: [phone]   Patient ID: Jenny Mcfarland, female    DOB: 01/22/1951, 73 y.o.   MRN: [id number]  This visit was conducted in person.  BP 110/80 (Cuff Size: Normal)   Pulse 65   Temp 98.5 F (36.9 C) (Oral)   Ht 5' 4 (1.626 m)   Wt 151 lb 6.4 oz (68.7 kg)   SpO2 97%   BMI 25.99 kg/m    CC: discuss constipation Subjective:   HPI: Jenny Mcfarland is a 73 y.o. female presenting on 08/01/2024 for Acute Visit (Constipation, onset 1 week,no bowel movements since onset, tried to treat with laxatives for 4 days with no relief, does feel urge to go but no luck even with pushing./Pt reports no pain or bloody stool prior to this/Reports little to no appetite, limited foods to soup and apple sauce/Had been self treating pain with Tylenol  and ifephrofen every 6-8 but then cut down to once a day//FYI mild outbreak of shingles on right side of back, would like to be prescribed gabapentin  for pain treatment //)   1 wk h/o constipation without successful BM despite taking dulcolax x4. She tried miralax x1 but this caused nausea. No abd pain, nausea/vomiting, she is passing gas Mcfarland. Since Saturday she has cut down on PO intake - only eating apple sauce, popsicles, dry toast, etc. She is drinking soups, feels she's staying well hydrated. She doesn't really drink water well. She feels she normally does Mcfarland with fruits/vegetables. Notes h/o IBS. No recent dietary changes. No dysuria.   Normally 1 BM/day with rare constipation, normally a laxative helps PRN.   Lower back pain recently - she had been treating with alternating tylenol  and ibuprofen. She notes these medicines sometimes make her constipated.   Recent shingles outbreak to R side of back - yesterday developed rash to this area. She has not had shingrix vaccines.  Requests Rx gabapentin  for pain.   Colonoscopy 05/2023 ext hemorrhoids o/w normal Oma)      Relevant past medical, surgical, family and social  history reviewed and updated as indicated. Interim medical history since our last visit reviewed. Allergies and medications reviewed and updated. Outpatient Medications Prior to Visit  Medication Sig Dispense Refill   albuterol (VENTOLIN HFA) 108 (90 Base) MCG/ACT inhaler Inhale 2 puffs into the lungs every 6 (six) hours as needed for wheezing or shortness of breath.     Cholecalciferol (VITAMIN D3) 1000 units CAPS Take by mouth. Pt taking 1 every other day     dicyclomine  (BENTYL ) 10 MG capsule Take 1 capsule (10 mg total) by mouth 3 (three) times daily as needed. 90 capsule 11   fexofenadine (ALLEGRA) 180 MG tablet Take 180 mg by mouth every morning.     IBUPROFEN & ACETAMINOPHEN  PO Take 500 mg by mouth as needed.     omeprazole  (PRILOSEC) 20 MG capsule Take 1 capsule (20 mg total) by mouth 2 (two) times daily before a meal. 60 capsule 11   Probiotic Product (ALIGN) 4 MG CAPS Take 1 capsule by mouth daily as needed (upset stomach).     Tdap (BOOSTRIX ) 5-2.5-18.5 LF-MCG/0.5 injection Inject into the muscle. 0.5 mL 0   vitamin B-12 (CYANOCOBALAMIN ) 1000 MCG tablet Take 1,000 mcg by mouth daily.     WIXELA INHUB 250-50 MCG/ACT AEPB  (Patient taking differently: 1 puff daily in the afternoon.)     zolpidem  (AMBIEN ) 5 MG tablet TAKE 1/2 TO 1 (ONE-HALF TO ONE) TABLET BY MOUTH AT  BEDTIME 30 tablet 3   No facility-administered medications prior to visit.     Per HPI unless specifically indicated in ROS section below Review of Systems  Objective:  BP 110/80 (Cuff Size: Normal)   Pulse 65   Temp 98.5 F (36.9 C) (Oral)   Ht 5' 4 (1.626 m)   Wt 151 lb 6.4 oz (68.7 kg)   SpO2 97%   BMI 25.99 kg/m   Wt Readings from Last 3 Encounters:  08/01/24 151 lb 6.4 oz (68.7 kg)  07/04/24 150 lb 12.8 oz (68.4 kg)  06/11/24 150 lb (68 kg)      Physical Exam Vitals and nursing note reviewed.  Constitutional:      Appearance: Normal appearance. She is not ill-appearing.  HENT:     Mouth/Throat:      Mouth: Mucous membranes are moist.     Pharynx: Oropharynx is clear. No oropharyngeal exudate or posterior oropharyngeal erythema.  Eyes:     Extraocular Movements: Extraocular movements intact.     Pupils: Pupils are equal, round, and reactive to light.  Cardiovascular:     Rate and Rhythm: Normal rate and regular rhythm.     Pulses: Normal pulses.     Heart sounds: Normal heart sounds. No murmur heard. Pulmonary:     Effort: Pulmonary effort is normal. No respiratory distress.     Breath sounds: Normal breath sounds. No wheezing, rhonchi or rales.  Abdominal:     General: Bowel sounds are normal. There is no distension.     Palpations: Abdomen is soft. There is no mass.     Tenderness: There is no abdominal tenderness. There is no guarding or rebound. Negative signs include Murphy's sign.     Hernia: No hernia is present.  Musculoskeletal:     Right lower leg: No edema.     Left lower leg: No edema.  Skin:    General: Skin is warm and dry.     Findings: Erythema and rash present.     Comments:  Erythematous rash to left lower lateral back with developing blisters - see picture  Neurological:     Mental Status: She is alert.  Psychiatric:        Mood and Affect: Mood normal.        Behavior: Behavior normal.        Results for orders placed or performed in visit on 01/27/24  Comprehensive metabolic panel with GFR   Collection Time: 01/27/24  8:34 AM  Result Value Ref Range   Sodium 143 135 - 145 mEq/L   Potassium 3.9 3.5 - 5.1 mEq/L   Chloride 105 96 - 112 mEq/L   CO2 31 19 - 32 mEq/L   Glucose, Bld 94 70 - 99 mg/dL   BUN 8 6 - 23 mg/dL   Creatinine, Ser 9.16 0.40 - 1.20 mg/dL   Total Bilirubin 0.9 0.2 - 1.2 mg/dL   Alkaline Phosphatase 74 39 - 117 U/L   AST 13 0 - 37 U/L   ALT 9 0 - 35 U/L   Total Protein 6.8 6.0 - 8.3 g/dL   Albumin 4.3 3.5 - 5.2 g/dL   GFR 29.48 >39.99 mL/min   Calcium 9.8 8.4 - 10.5 mg/dL  Lipid panel   Collection Time: 01/27/24  8:34 AM   Result Value Ref Range   Cholesterol 220 (H) 0 - 200 mg/dL   Triglycerides 10.9 0.0 - 149.0 mg/dL   HDL 16.39 >60.99 mg/dL   VLDL 82.1 0.0 -  40.0 mg/dL   LDL Cholesterol 880 (H) 0 - 99 mg/dL   Total CHOL/HDL Ratio 3    NonHDL 136.44   TSH   Collection Time: 01/27/24  8:34 AM  Result Value Ref Range   TSH 2.47 0.35 - 5.50 uIU/mL  Vitamin B12   Collection Time: 01/27/24  8:34 AM  Result Value Ref Range   Vitamin B-12 989 (H) 211 - 911 pg/mL  VITAMIN D  25 Hydroxy (Vit-D Deficiency, Fractures)   Collection Time: 01/27/24  8:34 AM  Result Value Ref Range   VITD 25.87 (L) 30.00 - 100.00 ng/mL  CBC   Collection Time: 01/27/24  8:34 AM  Result Value Ref Range   WBC 5.7 4.0 - 10.5 K/uL   RBC 4.34 3.87 - 5.11 Mil/uL   Platelets 212.0 150.0 - 400.0 K/uL   Hemoglobin 12.0 12.0 - 15.0 g/dL   HCT 63.4 63.9 - 53.9 %   MCV 84.1 78.0 - 100.0 fl   MCHC 33.0 30.0 - 36.0 g/dL   RDW 86.0 88.4 - 84.4 %  IBC + Ferritin   Collection Time: 01/27/24  8:34 AM  Result Value Ref Range   Iron  66 42 - 145 ug/dL   Transferrin 717.9 787.9 - 360.0 mg/dL   Saturation Ratios 83.2 (L) 20.0 - 50.0 %   Ferritin 13.7 10.0 - 291.0 ng/mL   TIBC 394.8 250.0 - 450.0 mcg/dL    Assessment & Plan:   Problem List Items Addressed This Visit     Constipation - Primary   Relevant Orders   DG Abd 2 Views   Shingles outbreak   Relevant Medications   valACYclovir  (VALTREX ) 1000 MG tablet     Meds ordered this encounter  Medications   valACYclovir  (VALTREX ) 1000 MG tablet    Sig: Take 1 tablet (1,000 mg total) by mouth 3 (three) times daily for 10 days.    Dispense:  30 tablet    Refill:  0   gabapentin  (NEURONTIN ) 300 MG capsule    Sig: Take 1 capsule (300 mg total) by mouth 2 (two) times daily as needed (shingles pain).    Dispense:  60 capsule    Refill:  1    Orders Placed This Encounter  Procedures   DG Abd 2 Views    Standing Status:   Future    Number of Occurrences:   1    Expiration Date:    08/01/2025    Reason for Exam (SYMPTOM  OR DIAGNOSIS REQUIRED):   constipation x 1 wk    Preferred imaging location?:   [facility]    Patient Instructions  For constipation: Increase water intake, work on [employer] such as prunes, kiwi.  May try soluble fiber supplement like citrucel over the counter - but you will need to increase water if you increase fiber to avoid GI upset.  You may try over the counter milk of magnesia as well as over the counter dulcolax or glycerin suppository. Let us  know if any worsening pain, you stop passing gas, or develop new nausea/vomiting.   For shingles: Start valtrex  antiviral 1000mg  three times daily for 7-10 days May use gabapentin  300mg  up to twice daily as needed for shingles pain /nerve pain   Follow up plan: Return if symptoms worsen or fail to improve.  Anton Blas, MD

## 2024-08-01 NOTE — Patient Instructions (Addendum)
 For constipation: Increase water intake, work on fiber rich foods such as prunes, kiwi.  May try soluble fiber supplement like citrucel over the counter - but you will need to increase water if you increase fiber to avoid GI upset.  You may try over the counter milk of magnesia as well as over the counter dulcolax or glycerin suppository. Let us  know if any worsening pain, you stop passing gas, or develop new nausea/vomiting.   For shingles: Start valtrex  antiviral 1000mg  three times daily for 7-10 days May use gabapentin  300mg  up to twice daily as needed for shingles pain /nerve pain

## 2024-08-02 NOTE — Assessment & Plan Note (Addendum)
 Anticipate case of severe constipation. Reviewed dietary measures, increased water, rec soluble fiber supplement (Citrucel), milk of magnesia, dulcolax or glycerin suppository. She had trouble tolerating miralax. Check 2 view abdomen - no signs of obstruction, but with significant stool load.  She declined rectal exam today.  Red flags to seek urgent care reviewed. She will update us  in 1-2 days with effect, consider enema.  Colonoscopy UTD.

## 2024-08-02 NOTE — Assessment & Plan Note (Signed)
 Story/exam consistent with uncomplicated shingles outbreak to left lateral back. ERx valtrex  1gm TID x7-10d. Will also start gabapentin  300mg  BID PRN for shingles pain - she has previously tolerated well.

## 2024-08-03 ENCOUNTER — Ambulatory Visit: Payer: Self-pay | Admitting: Family Medicine

## 2024-08-03 ENCOUNTER — Telehealth: Payer: Self-pay | Admitting: *Deleted

## 2024-08-03 NOTE — Telephone Encounter (Signed)
 Spoke with patient  Xray still pending.   No significant BM despite treatments tried to date (increased water, fiber, dulcolax, MOM). Has not tried fiber supplement.  Having some flow through liquid stools. Anticipate fecal impaction.  Will add stimulant laxative senna/senakot.  Discussed enema use- ie soap sud or glycerin enema, not fleet (sodium pyrophosphate).   Recommend OV Monday for rectal exam if above doesn't work.  ER precautions reviewed, as may need fecal disimpaction.   No hematochezia, weight loss, bloating, distension, abd pain, rectal pain.

## 2024-08-03 NOTE — Telephone Encounter (Signed)
 Copied from CRM #[id number]. Topic: General - Other >> Aug 03, 2024  2:20 PM Jenny Mcfarland wrote: Reason for CRM: Pt wants to inform Dr.Gutierrez that the shingles is still the same and not spreading. She also wants to inform him that she is still experiencing some constipation and she did everything he recommended. Pt stated that she would like to start taking the enema.and would like a callback with an update.

## 2024-08-03 NOTE — Telephone Encounter (Addendum)
 Pt has called back and wants answer today about constipation no better. Pt has increased water intake, eating more fiber rich food,OTC MOM, OTC dulcolax supp with small watery stool as results after dulcolax.No N&V,no abd cramping and no abd swelling. Pt is passing gas on and off. No abd pain other than pressure at rectum when tries to have BM. No discomfort now; pt had small BM 08/03/24 at 12 noon. No blood or mucus. [facility] [address]. Pt last seen 08/01/24. Pt wants to know if can try enema or just continue dulcolax suppository and MOM. Per [facility] office mgr advised to send note to Dr KANDICE and let  pt know as he can he will call or my  chart pt; UC & ED precautions given and pt voiced understanding.

## 2024-08-06 ENCOUNTER — Encounter: Payer: Self-pay | Admitting: Family Medicine

## 2024-08-06 NOTE — Telephone Encounter (Signed)
 Spoke with patient.  She did have large bowel movement after fleet enema at home Saturday evening. 3 large bowel movements Sunday morning, then another one after lunch yesterday.  She will continue high fiber high fluids and bowel regimen with miralax, senna, soluble fiber supplement.

## 2024-09-11 ENCOUNTER — Telehealth: Payer: Self-pay | Admitting: *Deleted

## 2024-09-11 NOTE — Telephone Encounter (Signed)
 Copied from CRM [id number]. Topic: General - Other >> Sep 11, 2024  2:23 PM Mesmerise C wrote: Reason for CRM: Patient stated she's been trying to reach her gastro office to schedule an appt called clinic got a recording stating  due to high call volume will answer call in order it was received  advised pt they may be assisting other patient's and that may be the delay but states it's been ongoing for a week and half and still hasn't been able to reach anyone

## 2024-09-11 NOTE — Telephone Encounter (Signed)
 Message routed to PCP, will route to [facility] to f/u with pt

## 2024-09-12 NOTE — Telephone Encounter (Signed)
 Attempted to reach patient, Jenny Mcfarland. Due to high call volume suggested patient try sending us  a Mychart message as she has not been able to get through on telephone.

## 2024-09-13 NOTE — Telephone Encounter (Signed)
 PT returned call. Stated she received information regarding her scheduled appointment and will keep the appointment in March

## 2024-09-13 NOTE — Telephone Encounter (Signed)
 Left message for patient to call back

## 2024-10-08 ENCOUNTER — Encounter

## 2024-11-05 ENCOUNTER — Ambulatory Visit: Admitting: Pediatrics

## 2025-02-06 ENCOUNTER — Ambulatory Visit

## 2025-03-21 ENCOUNTER — Other Ambulatory Visit

## 2025-03-29 ENCOUNTER — Encounter: Admitting: Family Medicine
